# Patient Record
Sex: Female | Born: 1935
Health system: Southern US, Community
[De-identification: ages and names within clinical notes are randomized; demographics above are authoritative.]

## PROBLEM LIST (undated history)

## (undated) DIAGNOSIS — K6289 Other specified diseases of anus and rectum: Secondary | ICD-10-CM

## (undated) DIAGNOSIS — K5120 Ulcerative (chronic) proctitis without complications: Secondary | ICD-10-CM

## (undated) DIAGNOSIS — K449 Diaphragmatic hernia without obstruction or gangrene: Secondary | ICD-10-CM

## (undated) DIAGNOSIS — I10 Essential (primary) hypertension: Secondary | ICD-10-CM

## (undated) DIAGNOSIS — K219 Gastro-esophageal reflux disease without esophagitis: Secondary | ICD-10-CM

## (undated) HISTORY — DX: Gastro-esophageal reflux disease without esophagitis: K21.9

## (undated) HISTORY — DX: Ulcerative (chronic) proctitis without complications: K51.20

## (undated) HISTORY — DX: Diaphragmatic hernia without obstruction or gangrene: K44.9

## (undated) HISTORY — PX: COLONOSCOPY: SHX174

## (undated) HISTORY — PX: DILATION AND CURETTAGE OF UTERUS: SHX78

## (undated) HISTORY — DX: Other specified diseases of anus and rectum: K62.89

## (undated) HISTORY — PX: TONSILLECTOMY: SUR1361

---

## 2002-12-16 ENCOUNTER — Ambulatory Visit (HOSPITAL_COMMUNITY): Admission: RE | Admit: 2002-12-16 | Discharge: 2002-12-16 | Payer: Self-pay | Admitting: Internal Medicine

## 2004-12-09 ENCOUNTER — Ambulatory Visit: Payer: Self-pay | Admitting: Internal Medicine

## 2005-08-27 ENCOUNTER — Ambulatory Visit: Payer: Self-pay | Admitting: Internal Medicine

## 2006-01-20 ENCOUNTER — Ambulatory Visit: Payer: Self-pay | Admitting: Internal Medicine

## 2007-02-25 ENCOUNTER — Ambulatory Visit: Payer: Self-pay | Admitting: Internal Medicine

## 2011-03-17 ENCOUNTER — Ambulatory Visit (INDEPENDENT_AMBULATORY_CARE_PROVIDER_SITE_OTHER): Payer: Medicare Other | Admitting: Internal Medicine

## 2011-03-17 DIAGNOSIS — K219 Gastro-esophageal reflux disease without esophagitis: Secondary | ICD-10-CM

## 2011-03-17 DIAGNOSIS — K5190 Ulcerative colitis, unspecified, without complications: Secondary | ICD-10-CM

## 2011-06-23 ENCOUNTER — Other Ambulatory Visit (INDEPENDENT_AMBULATORY_CARE_PROVIDER_SITE_OTHER): Payer: Self-pay | Admitting: *Deleted

## 2011-06-23 DIAGNOSIS — K219 Gastro-esophageal reflux disease without esophagitis: Secondary | ICD-10-CM

## 2011-06-23 MED ORDER — ESOMEPRAZOLE MAGNESIUM 40 MG PO CPDR: 40.0000 mg | DELAYED_RELEASE_CAPSULE | Freq: Every day | ORAL | Status: DC

## 2011-06-23 NOTE — Telephone Encounter (Signed)
Refill request  for Nexium, last filled . Will need prior authorization.

## 2011-10-08 ENCOUNTER — Telehealth (INDEPENDENT_AMBULATORY_CARE_PROVIDER_SITE_OTHER): Payer: Self-pay | Admitting: *Deleted

## 2011-10-08 NOTE — Telephone Encounter (Signed)
Patient needs a new rx sent in for sulfasalazine. The dosage was changed to 500 mg tid and the pharmacy doesn't have that on file. The return phone number is [phone].

## 2011-10-10 NOTE — Telephone Encounter (Signed)
[facility] was called and she states that the RX is for 500 mg three times a day.

## 2011-11-20 ENCOUNTER — Other Ambulatory Visit (HOSPITAL_COMMUNITY): Payer: Self-pay | Admitting: Urology

## 2011-11-20 DIAGNOSIS — N390 Urinary tract infection, site not specified: Secondary | ICD-10-CM

## 2011-11-25 ENCOUNTER — Ambulatory Visit (HOSPITAL_COMMUNITY)
Admission: RE | Admit: 2011-11-25 | Discharge: 2011-11-25 | Disposition: A | Discharge status: Home / Self Care | Payer: Medicare Other | Source: Ambulatory Visit | Attending: Urology | Admitting: Urology

## 2011-11-25 DIAGNOSIS — R9389 Abnormal findings on diagnostic imaging of other specified body structures: Secondary | ICD-10-CM | POA: Insufficient documentation

## 2011-11-25 DIAGNOSIS — N390 Urinary tract infection, site not specified: Secondary | ICD-10-CM

## 2011-11-25 DIAGNOSIS — R319 Hematuria, unspecified: Secondary | ICD-10-CM | POA: Insufficient documentation

## 2011-11-25 MED ORDER — IOHEXOL 300 MG/ML  SOLN
125.0000 mL | Freq: Once | INTRAMUSCULAR | Status: AC | PRN
  Administered 2011-11-25: 125 mL via INTRAVENOUS

## 2011-12-04 ENCOUNTER — Encounter (INDEPENDENT_AMBULATORY_CARE_PROVIDER_SITE_OTHER): Payer: Self-pay | Admitting: *Deleted

## 2011-12-04 ENCOUNTER — Encounter (INDEPENDENT_AMBULATORY_CARE_PROVIDER_SITE_OTHER): Payer: Self-pay | Admitting: Internal Medicine

## 2011-12-04 ENCOUNTER — Other Ambulatory Visit (INDEPENDENT_AMBULATORY_CARE_PROVIDER_SITE_OTHER): Payer: Self-pay | Admitting: *Deleted

## 2011-12-04 ENCOUNTER — Ambulatory Visit (INDEPENDENT_AMBULATORY_CARE_PROVIDER_SITE_OTHER): Payer: Medicare Other | Admitting: Internal Medicine

## 2011-12-04 DIAGNOSIS — K219 Gastro-esophageal reflux disease without esophagitis: Secondary | ICD-10-CM

## 2011-12-04 DIAGNOSIS — I10 Essential (primary) hypertension: Secondary | ICD-10-CM

## 2011-12-04 DIAGNOSIS — R1013 Epigastric pain: Secondary | ICD-10-CM

## 2011-12-04 DIAGNOSIS — K5120 Ulcerative (chronic) proctitis without complications: Secondary | ICD-10-CM

## 2011-12-04 MED ORDER — SUCRALFATE 1 GM/10ML PO SUSP: 1.0000 g | Freq: Four times a day (QID) | ORAL | Status: DC

## 2011-12-04 NOTE — Patient Instructions (Addendum)
EGD. Carafate 1 gm qid.

## 2011-12-04 NOTE — Progress Notes (Signed)
Subjective:     Patient ID: Autumn Dean, female   DOB: [birth year], 78 y.o.   MRN: [id number]  HPI Autumn Dean is a 78 yr old female referred to our office by Dr. Woody Seller. She tells me that she saw Dr Michela Pitcher 2 weeks ago. She c/o burning in her left upper quadrant radiating into back and nausea. One week ago she had a CT which suggested pyelonephritis.   She has been on Septra recently and PEN VK.  She tells me she has the head of her bed elevated. She has tried Herbal hot tea and this has helped.  Appetite is good. No weight loss.  She tells me that she had tarry, black stools the first of January. Frequent acid reflux. She does have a hx of UC but is not having any problems.  BM x 2 a day which are normal. No recent melena. No bright red rectal bleeding.   No dysphagia.  EGD/Colonoscopy 12/21/2007: Erosive esophagitis and small sliding hiatal hernia. Colonoscopy revealed active UC in the rectum and distal sigmoid. She has a hx of C-difficile.   CT abdomen/pelvis with CM 11/20/2011 Hematuria IMPRESSION:  1. Findings suggest pyelonephritis along with areas of remote  scarring change. No renal abscess.  2. No renal, ureteral or bladder calculi.  Original Report Authenticated By: P. MARK GALLERANI  Review of Systems See hpi   Current Outpatient Prescriptions  Medication Sig Dispense Refill  . bisoprolol-hydrochlorothiazide (ZIAC) 5-6.25 MG per tablet Take 1 tablet by mouth daily.      . calcium acetate (PHOSLO) 667 MG capsule Take 667 mg by mouth 3 (three) times daily with meals.      . cyanocobalamin 500 MCG tablet Take 500 mcg by mouth daily.      Marland Kitchen esomeprazole (NEXIUM) 40 MG capsule Take 1 capsule (40 mg total) by mouth daily.  30 capsule  1  . folic acid (FOLVITE) 1 MG tablet Take 1 mg by mouth daily.      . psyllium (METAMUCIL) 58.6 % packet Take 1 packet by mouth daily.      . raloxifene (EVISTA) 60 MG tablet Take 60 mg by mouth daily.      Marland Kitchen sulfaSALAzine (AZULFIDINE) 500 MG EC tablet  Take 1,000 mg by mouth 2 (two) times daily.      . sucralfate (CARAFATE) 1 GM/10ML suspension Take 10 mLs (1 g total) by mouth 4 (four) times daily.  420 mL  1   Past Medical History  Diagnosis Date  . UC (ulcerative colitis confined to rectum)   . GERD (gastroesophageal reflux disease)   . Hiatal hernia    History reviewed. No pertinent past surgical history. History   Social History  . Marital Status: Single    Spouse Name: N/A    Number of Children: N/A  . Years of Education: N/A   Occupational History  . Not on file.   Social History Main Topics  . Smoking status: Never Smoker   . Smokeless tobacco: Not on file  . Alcohol Use: No  . Drug Use: No  . Sexually Active: Not on file   Other Topics Concern  . Not on file   Social History Narrative  . No narrative on file   Family Status  Relation Status Death Age  . Mother Deceased     natural causes  . Father Deceased     Lung cancer  . Brother Deceased     One deceased from lung cancer and one died  from colon cancer age40.  One in good health   Allergies  Allergen Reactions  . Codeine   . Demerol        Objective:   Physical Exam  Filed Vitals:   12/04/11 1105  Height: 5' 5"  (1.651 m)  Weight: 155 lb 8 oz (70.534 kg)    Alert and oriented. Skin warm and dry. Oral mucosa is moist.   . Sclera anicteric, conjunctivae is pink. Thyroid not enlarged. No cervical lymphadenopathy. Lungs clear. Heart regular rate and rhythm.  Abdomen is soft. Bowel sounds are positive. No hepatomegaly. No abdominal masses felt. No tenderness.  No edema to lower extremities. Patient is alert and oriented.     Assessment:    Melena, GERD, Left upper quadrant pain. PUD needs needs to be ruled out. UC which appears to be in remission.    Plan:    EGD. Carafate 1 gm qid.   Continue present medications.

## 2011-12-16 ENCOUNTER — Encounter (HOSPITAL_COMMUNITY): Payer: Self-pay | Admitting: Pharmacy Technician

## 2011-12-17 MED ORDER — SODIUM CHLORIDE 0.45 % IV SOLN
Freq: Once | INTRAVENOUS | Status: AC
  Administered 2011-12-18: 1000 mL via INTRAVENOUS

## 2011-12-18 ENCOUNTER — Ambulatory Visit (HOSPITAL_COMMUNITY)
Admission: RE | Admit: 2011-12-18 | Discharge: 2011-12-18 | Disposition: A | Discharge status: Home / Self Care | Payer: Medicare Other | Source: Ambulatory Visit | Attending: Internal Medicine | Admitting: Internal Medicine

## 2011-12-18 ENCOUNTER — Encounter (HOSPITAL_COMMUNITY)
Admission: RE | Disposition: A | Discharge status: Home / Self Care | Payer: Self-pay | Source: Ambulatory Visit | Attending: Internal Medicine

## 2011-12-18 ENCOUNTER — Encounter (HOSPITAL_COMMUNITY): Payer: Self-pay | Admitting: *Deleted

## 2011-12-18 DIAGNOSIS — R1012 Left upper quadrant pain: Secondary | ICD-10-CM | POA: Insufficient documentation

## 2011-12-18 DIAGNOSIS — K449 Diaphragmatic hernia without obstruction or gangrene: Secondary | ICD-10-CM | POA: Insufficient documentation

## 2011-12-18 DIAGNOSIS — D131 Benign neoplasm of stomach: Secondary | ICD-10-CM | POA: Insufficient documentation

## 2011-12-18 DIAGNOSIS — R1013 Epigastric pain: Secondary | ICD-10-CM

## 2011-12-18 DIAGNOSIS — K219 Gastro-esophageal reflux disease without esophagitis: Secondary | ICD-10-CM

## 2011-12-18 DIAGNOSIS — R1032 Left lower quadrant pain: Secondary | ICD-10-CM

## 2011-12-18 DIAGNOSIS — B3781 Candidal esophagitis: Secondary | ICD-10-CM

## 2011-12-18 HISTORY — PX: ESOPHAGOGASTRODUODENOSCOPY: SHX5428

## 2011-12-18 LAB — KOH PREP

## 2011-12-18 SURGERY — EGD (ESOPHAGOGASTRODUODENOSCOPY)
Anesthesia: Moderate Sedation

## 2011-12-18 MED ORDER — BUTAMBEN-TETRACAINE-BENZOCAINE 2-2-14 % EX AERO
INHALATION_SPRAY | CUTANEOUS | Status: DC | PRN
  Administered 2011-12-18: 1 via TOPICAL

## 2011-12-18 MED ORDER — FENTANYL CITRATE 0.05 MG/ML IJ SOLN
INTRAMUSCULAR | Status: DC | PRN
  Administered 2011-12-18: 25 ug via INTRAVENOUS

## 2011-12-18 MED ORDER — PANTOPRAZOLE SODIUM 40 MG PO TBEC
40.0000 mg | DELAYED_RELEASE_TABLET | Freq: Two times a day (BID) | ORAL | Status: DC
Start: 2011-12-18 — End: 2012-07-19

## 2011-12-18 MED ORDER — FENTANYL CITRATE 0.05 MG/ML IJ SOLN
INTRAMUSCULAR | Status: AC
  Filled 2011-12-18: qty 2

## 2011-12-18 MED ORDER — MIDAZOLAM HCL 5 MG/5ML IJ SOLN
INTRAMUSCULAR | Status: AC
  Filled 2011-12-18: qty 5

## 2011-12-18 MED ORDER — NYSTATIN 100000 UNIT/ML MT SUSP: 500000.0000 [IU] | Freq: Four times a day (QID) | OROMUCOSAL | Status: AC

## 2011-12-18 MED ORDER — MIDAZOLAM HCL 5 MG/5ML IJ SOLN
INTRAMUSCULAR | Status: DC | PRN
  Administered 2011-12-18 (×2): 2 mg via INTRAVENOUS

## 2011-12-18 NOTE — Discharge Instructions (Signed)
Resume usual diet and medications except discontinue Nexium. Start pantoprazole 40 mg by mouth 30 minutes before breakfast and evening meal. Mycostatin suspension 500,000 units swish and swallow 4 times a day for 10 days. Notified if you have another episode of melena or tarry stool  Endoscopy Care After Please read the instructions outlined below and refer to this sheet in the next few weeks. These discharge instructions provide you with general information on caring for yourself after you leave the hospital. Your doctor may also give you specific instructions. While your treatment has been planned according to the most current medical practices available, unavoidable complications occasionally occur. If you have any problems or questions after discharge, please call your doctor. HOME CARE INSTRUCTIONS Activity  You may resume your regular activity but move at a slower pace for the next 24 hours.   Take frequent rest periods for the next 24 hours.   Walking will help expel (get rid of) the air and reduce the bloated feeling in your abdomen.   No driving for 24 hours (because of the anesthesia (medicine) used during the test).   You may shower.   Do not sign any important legal documents or operate any machinery for 24 hours (because of the anesthesia used during the test).  Nutrition  Drink plenty of fluids.   You may resume your normal diet.   Begin with a light meal and progress to your normal diet.   Avoid alcoholic beverages for 24 hours or as instructed by your caregiver.  Medications You may resume your normal medications unless your caregiver tells you otherwise. What you can expect today  You may experience abdominal discomfort such as a feeling of fullness or "gas" pains.   You may experience a sore throat for 2 to 3 days. This is normal. Gargling with salt water may help this.  Follow-up Your doctor will discuss the results of your test with you. SEEK IMMEDIATE  MEDICAL CARE IF:  You have excessive nausea (feeling sick to your stomach) and/or vomiting.   You have severe abdominal pain and distention (swelling).   You have trouble swallowing.   You have a temperature over 100 F (37.8 C).   You have rectal bleeding or vomiting of blood.  Document Released: 06/03/2004 Document Revised: 07/02/2011 Document Reviewed: 12/15/2007 [facility] Patient Information 2012 [place].

## 2011-12-18 NOTE — H&P (Signed)
This is an update to history and physical from 12/04/2011. Patient has not experience any more episodes of melena. She continues to complain of intermittent pain in left upper quadrant Carafate seemed to help. She believes Nexium is not working anymore and would like to try another PPI. Patient is undergoing diagnostic EGD 30

## 2011-12-18 NOTE — Op Note (Signed)
EGD PROCEDURE REPORT  PATIENT:  Autumn Dean  MR#:  [id number] Birthdate:  26-Dec-1932, 78 y.o., female Endoscopist:  Dr. Rogene Houston, MD Referred By:  Dr. Glenda Chroman, MD Procedure Date: 12/18/2011  Procedure:   EGD  Indications:  Patient is 13 old Caucasian female with history of melena which cleared spontaneously. She complains of intermittent left upper quadrant pain relieved with Carafate. She is undergoing diagnostic EGD. She is on Nexium for GERD and she said is not working. She has chronic UC and has been in remission for the past few years. She is undergoing diagnostic EGD.      Informed Consent:  The risks, benefits, alternatives & imponderables which include, but are not limited to, bleeding, infection, perforation, drug reaction and potential missed lesion have been reviewed.  The potential for biopsy, lesion removal, esophageal dilation, etc. have also been discussed.  Questions have been answered.  All parties agreeable.  Please see history & physical in medical record for more information.  Medications:  Fentanyl 25  mcg IV Versed 4 mg IV Cetacaine spray topically for oropharyngeal anesthesia  Description of procedure:  The endoscope was introduced through the mouth and advanced to the second portion of the duodenum without difficulty or limitations. The mucosal surfaces were surveyed very carefully during advancement of the scope and upon withdrawal.  Findings:  Esophagus:  There was patchy cheesy exudate at esophageal  body suspicious for Candida esophagitis. Brushing taken for KOH prep. No erosions or ulcers were identified. GEJ:  34 cm Hiatus:  36 cm Stomach:  Stomach was empty and distended very well with insufflation. Fold in the proximal stomach were normal. Examination mucosa revealed a few 3-4 mm hyperplastic-appearing polyps in gastric body which were left alone. No erosions or ulcers identified. Pyloric channel was patent. Angularis fundus and cardia  examined by retroflexion of scope were normal. Duodenum:  Normal bulbar and post bulbar mucosa.  Therapeutic/Diagnostic Maneuvers Performed:  Brushing obtained from the esophagus for KOH prep.  Complications:  None.  Impression: Mild changes of candida esophagitis. Brushing obtained for KOH prep. Small sliding hiatal hernia. Few small hyperplastic polyps in gastric body. These were left alone. No evidence of peptic ulcer disease or other gastric lesions.  Recommendations:  Mycostatin suspension 500,000 units swish and swallow 4 times a day for 10 days. Discontinue Nexium. Start pantoprazole 40 mg by mouth twice a day. Patient will call if melena recurs.  Damyan Corne U  12/18/2011  2:44 PM  CC: Dr. Glenda Chroman., MD, MD & Dr. Rayne Du ref. provider found

## 2011-12-25 ENCOUNTER — Encounter (HOSPITAL_COMMUNITY): Payer: Self-pay | Admitting: Internal Medicine

## 2012-01-28 ENCOUNTER — Encounter (INDEPENDENT_AMBULATORY_CARE_PROVIDER_SITE_OTHER): Payer: Self-pay | Admitting: Internal Medicine

## 2012-01-28 ENCOUNTER — Ambulatory Visit (INDEPENDENT_AMBULATORY_CARE_PROVIDER_SITE_OTHER): Payer: Medicare Other | Admitting: Internal Medicine

## 2012-01-28 VITALS — BP 130/62 | HR 66 | Temp 97.9°F | Ht 65.0 in | Wt 156.4 lb

## 2012-01-28 DIAGNOSIS — K5120 Ulcerative (chronic) proctitis without complications: Secondary | ICD-10-CM

## 2012-01-28 MED ORDER — HYDROCORTISONE 100 MG/60ML RE ENEM: 100.0000 mg | ENEMA | Freq: Every day | RECTAL | Status: DC

## 2012-01-28 NOTE — Patient Instructions (Addendum)
Sed rate and Hydrocortisone enemas called in.  OV in 2 months.

## 2012-01-28 NOTE — Progress Notes (Signed)
Subjective:     Patient ID: Autumn Dean, female   DOB: 08-15-1933, 78 y.o.   MRN: [id number]  HPI Autumn Dean is a 78 yr old female here today with c/o that she has been having a lot of gas.She says the Protonix is causing a lot of gas.  She tells me she is passing mucous and has been a little bit constipated.  She is having a BM x 2 today and are formed. Sometimes she will have 3 stools. She does not have urgency.  She was diagnosed with UC over 30 yrs ago.  In 2009 she underwent a colonoscopy which revealed non-specific colitis.  Review of Systems see hpi Current Outpatient Prescriptions  Medication Sig Dispense Refill  . bisoprolol-hydrochlorothiazide (ZIAC) 5-6.25 MG per tablet Take 1 tablet by mouth daily.      . calcium acetate (PHOSLO) 667 MG capsule Take 667 mg by mouth 3 (three) times daily with meals.      . Calcium Carbonate-Vit D-Min (CALCIUM 600+D PLUS MINERALS) 600-400 MG-UNIT TABS Take by mouth.      . cyanocobalamin 500 MCG tablet Take 500 mcg by mouth daily.      . folic acid (FOLVITE) 1 MG tablet Take 1 mg by mouth daily.      . pantoprazole (PROTONIX) 40 MG tablet Take 1 tablet (40 mg total) by mouth 2 (two) times daily.  60 tablet  11  . psyllium (METAMUCIL) 58.6 % packet Take 1 packet by mouth daily.      . raloxifene (EVISTA) 60 MG tablet Take 60 mg by mouth daily.      Marland Kitchen sulfaSALAzine (AZULFIDINE) 500 MG EC tablet Take 500 mg by mouth 3 (three) times daily.        Current Outpatient Prescriptions on File Prior to Visit  Medication Sig Dispense Refill  . bisoprolol-hydrochlorothiazide (ZIAC) 5-6.25 MG per tablet Take 1 tablet by mouth daily.      . calcium acetate (PHOSLO) 667 MG capsule Take 667 mg by mouth 3 (three) times daily with meals.      . Calcium Carbonate-Vit D-Min (CALCIUM 600+D PLUS MINERALS) 600-400 MG-UNIT TABS Take by mouth.      . cyanocobalamin 500 MCG tablet Take 500 mcg by mouth daily.      . folic acid (FOLVITE) 1 MG tablet Take 1 mg by mouth  daily.      . pantoprazole (PROTONIX) 40 MG tablet Take 1 tablet (40 mg total) by mouth 2 (two) times daily.  60 tablet  11  . psyllium (METAMUCIL) 58.6 % packet Take 1 packet by mouth daily.      . raloxifene (EVISTA) 60 MG tablet Take 60 mg by mouth daily.      Marland Kitchen sulfaSALAzine (AZULFIDINE) 500 MG EC tablet Take 500 mg by mouth 3 (three) times daily.        Past Surgical History  Procedure Date  . Esophagogastroduodenoscopy 12/18/2011    Procedure: ESOPHAGOGASTRODUODENOSCOPY (EGD);  Surgeon: Rogene Houston, MD;  Location: [facility];  Service: Endoscopy;  Laterality: N/A;  200   Family Status  Relation Status Death Age  . Mother Deceased     natural causes  . Father Deceased     Lung cancer  . Brother Deceased     One deceased from lung cancer and one died from colon cancer age40.  One in good health   History   Social History  . Marital Status: Single    Spouse Name: N/A  Number of Children: N/A  . Years of Education: N/A   Occupational History  . Not on file.   Social History Main Topics  . Smoking status: Never Smoker   . Smokeless tobacco: Not on file  . Alcohol Use: No  . Drug Use: No  . Sexually Active: Not on file   Other Topics Concern  . Not on file   Social History Narrative  . No narrative on file   Allergies  Allergen Reactions  . Codeine   . Demerol        Objective:   Physical Exam Filed Vitals:   01/28/12 1556  Height: 5' 5"  (1.651 m)  Weight: 156 lb 6.4 oz (70.943 kg)   Alert and oriented. Skin warm and dry. Oral mucosa is moist.   . Sclera anicteric, conjunctivae is pink. Thyroid not enlarged. No cervical lymphadenopathy. Lungs clear. Heart regular rate and rhythm.  Abdomen is soft. Bowel sounds are positive. No hepatomegaly. No abdominal masses felt. No tenderness.  No edema to lower extremities. Patient is alert and oriented.      Assessment:   UC  Flare possible.    Plan:   Hydrocortisone enema 1 PR every HS x 2 weeks. OV in 2  months.

## 2012-01-29 LAB — SEDIMENTATION RATE: Sed Rate: 6 mm/hr (ref 0–22)

## 2012-03-30 ENCOUNTER — Ambulatory Visit (INDEPENDENT_AMBULATORY_CARE_PROVIDER_SITE_OTHER): Payer: Medicare Other | Admitting: Internal Medicine

## 2012-04-06 ENCOUNTER — Ambulatory Visit (INDEPENDENT_AMBULATORY_CARE_PROVIDER_SITE_OTHER): Payer: Medicare Other | Admitting: Internal Medicine

## 2012-06-10 ENCOUNTER — Encounter (INDEPENDENT_AMBULATORY_CARE_PROVIDER_SITE_OTHER): Payer: Self-pay | Admitting: *Deleted

## 2012-07-19 ENCOUNTER — Telehealth (INDEPENDENT_AMBULATORY_CARE_PROVIDER_SITE_OTHER): Payer: Self-pay | Admitting: *Deleted

## 2012-07-19 ENCOUNTER — Ambulatory Visit (INDEPENDENT_AMBULATORY_CARE_PROVIDER_SITE_OTHER): Payer: Medicare Other | Admitting: Internal Medicine

## 2012-07-19 ENCOUNTER — Encounter (INDEPENDENT_AMBULATORY_CARE_PROVIDER_SITE_OTHER): Payer: Self-pay | Admitting: Internal Medicine

## 2012-07-19 ENCOUNTER — Other Ambulatory Visit (INDEPENDENT_AMBULATORY_CARE_PROVIDER_SITE_OTHER): Payer: Self-pay | Admitting: *Deleted

## 2012-07-19 VITALS — BP 120/70 | HR 74 | Temp 97.1°F | Resp 20 | Ht 65.0 in | Wt 150.0 lb

## 2012-07-19 DIAGNOSIS — K219 Gastro-esophageal reflux disease without esophagitis: Secondary | ICD-10-CM

## 2012-07-19 DIAGNOSIS — K5190 Ulcerative colitis, unspecified, without complications: Secondary | ICD-10-CM

## 2012-07-19 DIAGNOSIS — Z1211 Encounter for screening for malignant neoplasm of colon: Secondary | ICD-10-CM

## 2012-07-19 DIAGNOSIS — R195 Other fecal abnormalities: Secondary | ICD-10-CM

## 2012-07-19 MED ORDER — OMEPRAZOLE MAGNESIUM 20 MG PO TBEC: 20.0000 mg | DELAYED_RELEASE_TABLET | Freq: Every day | ORAL | Status: DC

## 2012-07-19 NOTE — Progress Notes (Signed)
Presenting complaint;  Heme positive stool. History of ulcerative colitis.  Subjective:  Autumn Dean is 78 year old Caucasian female who has history of distal ulcerative colitis and was last seen 6 months ago. Since last colonoscopy was in February 2009 and she was noted have active disease. However lately she is having 1-2 formed stools daily. She denies melena or rectal bleeding. She has good appetite. She has occasional right-sided pain which she describes as a gas pain. She has been having intermittent heartburn lately.she tried Prilosec and feels it works better than Nexium. She would therefore like to be switched to Prilosec.she denies dysphagia. Patient has history of erosive esophagitis at the time of last EGD of February 2009.  Current Medications: Current Outpatient Prescriptions  Medication Sig Dispense Refill  . bisoprolol-hydrochlorothiazide (ZIAC) 5-6.25 MG per tablet Take 1 tablet by mouth daily.      . Calcium Carbonate-Vit D-Min (CALCIUM 600+D PLUS MINERALS) 600-400 MG-UNIT TABS Take by mouth 2 (two) times daily.       Autumn Dean Kitchen Autumn Dean (NEXIUM) 40 MG capsule Take 40 mg by mouth daily before breakfast.      . folic acid (FOLVITE) 1 MG tablet Take 1 mg by mouth daily.      . psyllium (METAMUCIL) 58.6 % packet Take 1 packet by mouth daily.      Autumn Dean Kitchen sulfaSALAzine (AZULFIDINE) 500 MG EC tablet Take 500 mg by mouth 3 (three) times daily.       . cyanocobalamin 500 MCG tablet Take 500 mcg by mouth daily.      . pantoprazole (PROTONIX) 40 MG tablet Take 1 tablet (40 mg total) by mouth 2 (two) times daily.  60 tablet  11  . raloxifene (EVISTA) 60 MG tablet Take 60 mg by mouth daily.      Autumn Dean Kitchen DISCONTD: fluticasone (FLONASE) 50 MCG/ACT nasal spray Place 2 sprays into the nose daily.      Autumn Dean Kitchen DISCONTD: sucralfate (CARAFATE) 1 GM/10ML suspension Take 10 mLs (1 g total) by mouth 4 (four) times daily.  420 mL  1     Objective: Blood pressure 120/70, pulse 74, temperature 97.1 F (36.2 C),  temperature source Oral, resp. rate 20, height 5' 5"  (1.651 m), weight 150 lb (68.04 kg). Patient is alert and in no acute distress. Conjunctiva is pink. Sclera is nonicteric Oropharyngeal mucosa is normal. No neck masses or thyromegaly noted. Cardiac exam with regular rhythm normal S1 and S2. No murmur or gallop noted. Lungs are clear to auscultation. Abdomen is soft and nontender without organomegaly or masses.  No LE edema or clubbing noted.  Labs/studies Results:  from 05/28/2012. WBC 3.6, H&H 12.3 and 37.9, platelet count 242K. Total bilirubin 0.4, AP 76, AST 18, ALT 13, albumin 4.1, calcium 9.0. Creatinine 0.93 and BUN 17 glucose 82 electrolytes are normal.  Assessment:   #1.Patient has history of distal ulcerative colitis and appears to be in remission. Her last colonoscopy was in February 2009. Given that she has heme positive stools we need to proceed with colonoscopy now rather than next year. Her H&H is normal which is reassuring. #2. GERD. She is having after symptom controlled with Prilosec or omeprazole rather than Nexium. Therefore will switch her to omeprazole. #3. Mild leukopenia with a normal differential. She will need repeat CBC within the next few weeks to make sure neutropenia not progressive.   Plan:  Discontinue Nexium. Start Prilosec OTC 20 mg by mouth every morning. Colonoscopy to be scheduled in near future.

## 2012-07-19 NOTE — Telephone Encounter (Signed)
Will u put medication back in.

## 2012-07-19 NOTE — Patient Instructions (Addendum)
Colonoscopy to be scheduled

## 2012-07-19 NOTE — Telephone Encounter (Signed)
Patient needs trilyte 

## 2012-07-20 ENCOUNTER — Ambulatory Visit (INDEPENDENT_AMBULATORY_CARE_PROVIDER_SITE_OTHER): Payer: Medicare Other | Admitting: Internal Medicine

## 2012-07-20 MED ORDER — PEG 3350-KCL-NA BICARB-NACL 420 G PO SOLR: 4000.0000 mL | Freq: Once | ORAL | Status: DC

## 2012-08-13 ENCOUNTER — Encounter (HOSPITAL_COMMUNITY): Payer: Self-pay | Admitting: Pharmacy Technician

## 2012-08-17 MED ORDER — SODIUM CHLORIDE 0.45 % IV SOLN
INTRAVENOUS | Status: DC
  Administered 2012-08-18: 11:00:00 via INTRAVENOUS

## 2012-08-18 ENCOUNTER — Ambulatory Visit (HOSPITAL_COMMUNITY)
Admission: RE | Admit: 2012-08-18 | Discharge: 2012-08-18 | Disposition: A | Discharge status: Home / Self Care | Payer: Medicare Other | Source: Ambulatory Visit | Attending: Internal Medicine | Admitting: Internal Medicine

## 2012-08-18 ENCOUNTER — Encounter (HOSPITAL_COMMUNITY): Payer: Self-pay | Admitting: *Deleted

## 2012-08-18 ENCOUNTER — Encounter (HOSPITAL_COMMUNITY)
Admission: RE | Disposition: A | Discharge status: Home / Self Care | Payer: Self-pay | Source: Ambulatory Visit | Attending: Internal Medicine

## 2012-08-18 DIAGNOSIS — K5289 Other specified noninfective gastroenteritis and colitis: Secondary | ICD-10-CM | POA: Insufficient documentation

## 2012-08-18 DIAGNOSIS — K5190 Ulcerative colitis, unspecified, without complications: Secondary | ICD-10-CM

## 2012-08-18 DIAGNOSIS — R195 Other fecal abnormalities: Secondary | ICD-10-CM

## 2012-08-18 DIAGNOSIS — I10 Essential (primary) hypertension: Secondary | ICD-10-CM | POA: Insufficient documentation

## 2012-08-18 DIAGNOSIS — K921 Melena: Secondary | ICD-10-CM | POA: Insufficient documentation

## 2012-08-18 HISTORY — PX: COLONOSCOPY: SHX5424

## 2012-08-18 HISTORY — DX: Essential (primary) hypertension: I10

## 2012-08-18 SURGERY — COLONOSCOPY
Anesthesia: Moderate Sedation

## 2012-08-18 MED ORDER — FENTANYL CITRATE 0.05 MG/ML IJ SOLN
INTRAMUSCULAR | Status: DC | PRN
  Administered 2012-08-18 (×2): 25 ug via INTRAVENOUS

## 2012-08-18 MED ORDER — MIDAZOLAM HCL 5 MG/5ML IJ SOLN
INTRAMUSCULAR | Status: AC
  Filled 2012-08-18: qty 10

## 2012-08-18 MED ORDER — FENTANYL CITRATE 0.05 MG/ML IJ SOLN
INTRAMUSCULAR | Status: AC
  Filled 2012-08-18: qty 2

## 2012-08-18 MED ORDER — MIDAZOLAM HCL 5 MG/5ML IJ SOLN
INTRAMUSCULAR | Status: DC | PRN
  Administered 2012-08-18: 2 mg via INTRAVENOUS

## 2012-08-18 MED ORDER — STERILE WATER FOR IRRIGATION IR SOLN
Status: DC | PRN
  Administered 2012-08-18: 12:00:00

## 2012-08-18 NOTE — H&P (Signed)
Autumn Dean is an 78 y.o. female.   Chief Complaint: Patient is here for colonoscopy. 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 Her last colonoscopy was 4 years ago. She is undergoing diagnostic/surveillance colonoscopy. She has good appetite and her weight is stable. Since she has been taking probiotic her stools have been firm to hard.  Past Medical History  Diagnosis Date  . UC (ulcerative colitis confined to rectum)   . GERD (gastroesophageal reflux disease)   . Hiatal hernia   . Proctitis   . Hypertension     Past Surgical History  Procedure Date  . Esophagogastroduodenoscopy 12/18/2011    Procedure: ESOPHAGOGASTRODUODENOSCOPY (EGD);  Surgeon: Rogene Houston, MD;  Location: [facility];  Service: Endoscopy;  Laterality: N/A;  200  . Tonsillectomy   . Colonoscopy   . Dilation and curettage of uterus     Family History  Problem Relation Age of Onset  . Colon cancer Neg Hx    Social History:  reports that she has never smoked. She has never used smokeless tobacco. She reports that she does not drink alcohol or use illicit drugs.  Allergies:  Allergies  Allergen Reactions  . Codeine Rash  . Demerol Rash    Medications Prior to Admission  Medication Sig Dispense Refill  . bisoprolol-hydrochlorothiazide (ZIAC) 5-6.25 MG per tablet Take 1 tablet by mouth daily.      . Calcium Carbonate-Vit D-Min (CALCIUM 600+D PLUS MINERALS) 600-400 MG-UNIT TABS Take by mouth 2 (two) times daily.       Marland Kitchen omeprazole (PRILOSEC OTC) 20 MG tablet Take 1 tablet (20 mg total) by mouth daily.  30 tablet  11  . polyethylene glycol-electrolytes (NULYTELY/GOLYTELY) 420 G solution Take 4,000 mLs by mouth once.  4000 mL  0  . cyanocobalamin 500 MCG tablet Take 500 mcg by mouth daily.      . folic acid (FOLVITE) 1 MG tablet Take 1 mg by mouth daily.      . psyllium (METAMUCIL) 58.6 % packet Take 1  packet by mouth daily.      Marland Kitchen sulfaSALAzine (AZULFIDINE) 500 MG EC tablet Take 500 mg by mouth 3 (three) times daily.         No results found for this or any previous visit (from the past 48 hour(s)). No results found.  ROS  Blood pressure 161/82, pulse 74, temperature 97.9 F (36.6 C), temperature source Oral, resp. rate 19, height 5' 5"  (1.651 m), weight 150 lb (68.04 kg), SpO2 100.00%. Physical Exam  Constitutional: She appears well-developed and well-nourished.  HENT:  Mouth/Throat: Oropharynx is clear and moist.  Eyes: Conjunctivae normal are normal. No scleral icterus.  Neck: No thyromegaly present.  Cardiovascular: Normal rate, regular rhythm and normal heart sounds.   No murmur heard. Respiratory: Effort normal and breath sounds normal.  GI: Soft. She exhibits no distension and no mass. There is no tenderness.  Musculoskeletal: She exhibits no edema.  Lymphadenopathy:    She has no cervical adenopathy.  Neurological: She is alert.  Skin: Skin is warm.     Assessment/Plan Chronic ulcerative colitis. Heme positive stools. Diagnostic/surveillance colonoscopy.  REHMAN,NAJEEB U 08/18/2012, 11:49 AM

## 2012-08-18 NOTE — Op Note (Signed)
COLONOSCOPY PROCEDURE REPORT  PATIENT:  Autumn Dean  MR#:  [id number] Birthdate:  30-Mar-1933, 78 y.o., female Endoscopist:  Dr. Rogene Houston, MD Referred By:  Dr. Glenda Chroman, MD Procedure Date: 08/18/2012  Procedure:   Colonoscopy  Indications:  Patient is 78 year old Caucasian female with history of chronic ulcerative colitis whose last exam was over 4 years ago was noted to have heme positive stools. She is having normal bowel movements. She is undergoing diagnostic/surveillance colonoscopy.  Informed Consent:  The procedure and risks were reviewed with the patient and informed consent was obtained.  Medications:  Fentanyl 50 mcg IV Versed 2 mg IV  Description of procedure:  After a digital rectal exam was performed, that colonoscope was advanced from the anus through the rectum and colon to the area of the cecum, ileocecal valve and appendiceal orifice. The cecum was deeply intubated. These structures were well-seen and photographed for the record. From the level of the cecum and ileocecal valve, the scope was slowly and cautiously withdrawn. The mucosal surfaces were carefully surveyed utilizing scope tip to flexion to facilitate fold flattening as needed. The scope was pulled down into the rectum where a thorough exam including retroflexion was performed.  Findings:   Prep excellent. Multiple punctate erosions noted involving mucosa of distal 10 cm of sigmoid colon and rectum. Multiple biopsies taken from rectosigmoid area and submitted together. Rest of the mucosa was normal. Thickened anoderm. Therapeutic/Diagnostic Maneuvers Performed:  See above  Complications:  None  Cecal Withdrawal Time:  15 minutes  Impression:  Examination performed to cecum. Active colitis involving distal sigmoid colon and rectum (multiple erosions and friable mucosa). Biopsies taken. No evidence of polyps or mass.   Recommendations:  Standard instructions given. I will contact patient  with biopsy results and further recommendations.  REHMAN,NAJEEB U  08/18/2012 12:24 PM  CC: Dr. Glenda Chroman., MD & Dr. Rayne Du ref. provider found

## 2012-08-20 ENCOUNTER — Encounter (HOSPITAL_COMMUNITY): Payer: Self-pay | Admitting: Internal Medicine

## 2012-08-23 ENCOUNTER — Encounter (INDEPENDENT_AMBULATORY_CARE_PROVIDER_SITE_OTHER): Payer: Self-pay

## 2012-08-23 ENCOUNTER — Other Ambulatory Visit (INDEPENDENT_AMBULATORY_CARE_PROVIDER_SITE_OTHER): Payer: Self-pay | Admitting: Internal Medicine

## 2012-08-23 DIAGNOSIS — K5120 Ulcerative (chronic) proctitis without complications: Secondary | ICD-10-CM

## 2012-08-23 MED ORDER — MESALAMINE 1000 MG RE SUPP: 1000.0000 mg | Freq: Every day | RECTAL | Status: DC

## 2012-08-24 ENCOUNTER — Encounter (INDEPENDENT_AMBULATORY_CARE_PROVIDER_SITE_OTHER): Payer: Self-pay | Admitting: *Deleted

## 2012-08-25 ENCOUNTER — Telehealth (INDEPENDENT_AMBULATORY_CARE_PROVIDER_SITE_OTHER): Payer: Self-pay | Admitting: *Deleted

## 2012-08-25 NOTE — Telephone Encounter (Signed)
Patient states that Dr.Rehman called and left her an message with her results. She ask 2 questions  1. How long will she need to use the Canasa Suppositories?  2. When will she know that it has cleared up since she has had no symptoms/signs? She has appointment for 10-23-12.

## 2012-08-26 NOTE — Telephone Encounter (Signed)
She'll use it for 8 weeks. Will do Hemoccult at the time of office visit to make sure weight is negative. Please call patient

## 2012-08-26 NOTE — Telephone Encounter (Signed)
Patient was called and a message was left on her voicemail at her home.

## 2012-11-15 ENCOUNTER — Encounter (INDEPENDENT_AMBULATORY_CARE_PROVIDER_SITE_OTHER): Payer: Self-pay | Admitting: Internal Medicine

## 2012-11-15 ENCOUNTER — Ambulatory Visit (INDEPENDENT_AMBULATORY_CARE_PROVIDER_SITE_OTHER): Payer: Medicare Other | Admitting: Internal Medicine

## 2012-11-15 VITALS — BP 112/76 | HR 72 | Temp 97.4°F | Resp 18 | Ht 65.0 in | Wt 154.9 lb

## 2012-11-15 DIAGNOSIS — K5190 Ulcerative colitis, unspecified, without complications: Secondary | ICD-10-CM

## 2012-11-15 DIAGNOSIS — K219 Gastro-esophageal reflux disease without esophagitis: Secondary | ICD-10-CM

## 2012-11-15 MED ORDER — OMEPRAZOLE MAGNESIUM 20 MG PO TBEC: 20.0000 mg | DELAYED_RELEASE_TABLET | Freq: Every day | ORAL | Status: DC

## 2012-11-15 NOTE — Patient Instructions (Signed)
Hemoccult x1. Take omeprazole 20 mg by mouth 30 minutes before breakfast daily

## 2012-11-15 NOTE — Progress Notes (Signed)
Presenting complaint;  Followup for ulcerative colitis.  Subjective:  Patient is 79 year old Caucasian female with chronic ulcerative colitis and underwent diagnostic/surveillance colonoscopy in October 2013. At that time her stool was guaiac-positive. Colonoscopy revealed active disease in rectum and distal sigmoid colon. She has been using mesalamine suppositories. She has no GI complaints. She has 3 formed stools daily. She denies rectal discharge or bleeding. She's had recurrent bladder infections. She stop using depends since she is not having rectal discharge anymore. Her appetite is good. Her heartburn is well controlled with PPI and she would like to get refill.  Current Medications: Current Outpatient Prescriptions  Medication Sig Dispense Refill  . bisoprolol-hydrochlorothiazide (ZIAC) 5-6.25 MG per tablet Take 1 tablet by mouth daily.      . Calcium Carbonate-Vit D-Min (CALCIUM 600+D PLUS MINERALS) 600-400 MG-UNIT TABS Take by mouth 2 (two) times daily.       . cyanocobalamin 500 MCG tablet Take 1,000 mcg by mouth daily.       . folic acid (FOLVITE) 1 MG tablet Take 1 mg by mouth daily.      . mesalamine (CANASA) 1000 MG suppository Place 1 suppository (1,000 mg total) rectally at bedtime.  30 suppository  1  . omeprazole (PRILOSEC OTC) 20 MG tablet Take 1 tablet (20 mg total) by mouth daily.  30 tablet  11  . Probiotic Product (PROBIOTIC DAILY PO) Take by mouth.      . psyllium (METAMUCIL) 58.6 % packet Take 1 packet by mouth daily.      Marland Kitchen sulfaSALAzine (AZULFIDINE) 500 MG EC tablet Take 500 mg by mouth 3 (three) times daily.       . [DISCONTINUED] fluticasone (FLONASE) 50 MCG/ACT nasal spray Place 2 sprays into the nose daily.      . [DISCONTINUED] sucralfate (CARAFATE) 1 GM/10ML suspension Take 10 mLs (1 g total) by mouth 4 (four) times daily.  420 mL  1     Objective: Blood pressure 112/76, pulse 72, temperature 97.4 F (36.3 C), temperature source Oral, resp. rate 18,  height 5' 5"  (1.651 m), weight 154 lb 14.4 oz (70.262 kg). Conjunctiva is pink. Sclera is nonicteric Oropharyngeal mucosa is normal. No neck masses or thyromegaly noted. Abdomen is soft and nontender without organomegaly or masses. No LE edema or clubbing noted.   Assessment:  #1. Ulcerative colitis. Last colonoscopy was 3 months ago revealing active disease in rectum and distal sigmoid colon and she's been maintained on mesalamine suppositories. She appears to be back in remission but will make sure that her stool is guaiac negative before therapy discontinued. #2. GERD. Symptoms well controlled with PPI.   Plan:  Hemoccult x1. If Hemoccult is negative will stop mesalamine suppositories when she runs out of prescription otherwise we'll continue at Monday Wednesday and Friday schedule for another 3 months. New prescription for omeprazole 20 mg daily 90 day supply with 3 refills issued. Office visit in one year.

## 2012-12-07 ENCOUNTER — Telehealth (INDEPENDENT_AMBULATORY_CARE_PROVIDER_SITE_OTHER): Payer: Self-pay | Admitting: *Deleted

## 2012-12-07 ENCOUNTER — Encounter (INDEPENDENT_AMBULATORY_CARE_PROVIDER_SITE_OTHER): Payer: Self-pay

## 2012-12-07 NOTE — Telephone Encounter (Signed)
Per Dr.Rehman is it okay to switch to Omeprazole 20 mg Take 1 by mouth daily #30 11 refills. [facility]

## 2013-01-03 ENCOUNTER — Other Ambulatory Visit (INDEPENDENT_AMBULATORY_CARE_PROVIDER_SITE_OTHER): Payer: Self-pay | Admitting: Internal Medicine

## 2013-01-08 ENCOUNTER — Other Ambulatory Visit (INDEPENDENT_AMBULATORY_CARE_PROVIDER_SITE_OTHER): Payer: Self-pay | Admitting: Internal Medicine

## 2013-06-02 ENCOUNTER — Telehealth (INDEPENDENT_AMBULATORY_CARE_PROVIDER_SITE_OTHER): Payer: Self-pay | Admitting: *Deleted

## 2013-06-02 NOTE — Telephone Encounter (Signed)
11:33 am Patient had called in and left a voice mail that she thinks she needs an appt.    Returned her 1:56 pm  Patient states she did have her PE from Dr. Woody Seller this week and they gave her a hemoccult card.  She feels like she may have some mild tenderness in her rectum.  She will be taking this back today to them or tomorrow.  She usually has diarrhea but she is now have constipation.  She will get Dr. Woody Seller to advise [place] or she will call back if she had a positive stool and get scheduled.  She decided not to make an appt. At this time with [place].  Told her we would be here if she needed [place].  Call ended

## 2013-07-06 ENCOUNTER — Encounter: Payer: Self-pay | Admitting: Cardiology

## 2013-07-08 ENCOUNTER — Ambulatory Visit (INDEPENDENT_AMBULATORY_CARE_PROVIDER_SITE_OTHER): Payer: Medicare Other | Admitting: Cardiovascular Disease

## 2013-07-08 ENCOUNTER — Encounter: Payer: Self-pay | Admitting: *Deleted

## 2013-07-08 ENCOUNTER — Encounter: Payer: Self-pay | Admitting: Cardiovascular Disease

## 2013-07-08 VITALS — BP 149/83 | HR 79 | Ht 65.0 in | Wt 148.8 lb

## 2013-07-08 DIAGNOSIS — R5381 Other malaise: Secondary | ICD-10-CM

## 2013-07-08 DIAGNOSIS — R0609 Other forms of dyspnea: Secondary | ICD-10-CM

## 2013-07-08 DIAGNOSIS — R5383 Other fatigue: Secondary | ICD-10-CM

## 2013-07-08 DIAGNOSIS — I2789 Other specified pulmonary heart diseases: Secondary | ICD-10-CM

## 2013-07-08 DIAGNOSIS — I059 Rheumatic mitral valve disease, unspecified: Secondary | ICD-10-CM

## 2013-07-08 DIAGNOSIS — I272 Pulmonary hypertension, unspecified: Secondary | ICD-10-CM

## 2013-07-08 DIAGNOSIS — I10 Essential (primary) hypertension: Secondary | ICD-10-CM

## 2013-07-08 DIAGNOSIS — I340 Nonrheumatic mitral (valve) insufficiency: Secondary | ICD-10-CM | POA: Insufficient documentation

## 2013-07-08 DIAGNOSIS — R0600 Dyspnea, unspecified: Secondary | ICD-10-CM

## 2013-07-08 NOTE — Patient Instructions (Signed)
Your physician has requested that you have a lexiscan myoview. For further information please visit [email or web address]. Please follow instruction sheet, as given. Office will contact with results via phone or letter.   Echo - due in 1 year - reminder will be mailed Continue all current medications. Your physician wants you to follow up in: 6 months.  You will receive a reminder letter in the mail one-two months in advance.  If you don't receive a letter, please call our office to schedule the follow up appointment

## 2013-07-08 NOTE — Progress Notes (Signed)
Patient ID: Autumn Dean, female   DOB: Mar 10, 1933, 79 y.o.   MRN: [id number]    CARDIOLOGY CONSULT NOTE  Patient ID: Autumn Dean MRN: [id number] DOB/AGE: August 21, 1933 79 y.o.  Primary Physician: Autumn Dean Reason for Consultation: valvular heart disease  HPI:  Autumn Dean has a PMH significant for HTN, GERD, and ulcerative colitis. She recently underwent an echocardiogram which showed normal LV systolic function, EF 82-50%, moderate mitral regurgitation, mild to moderate tricuspid regurgitation, RVSP 45-55 mmHg, RA/RV upper limits normal to mildly dilated, and mild to moderate LAE. It was apparently ordered for a murmur.  She denies chest pain. She has felt somewhat fatigued and likes to mow the lawn, but thinks it may have been due to the hot weather. She occasionally gets short of breath when she walks to and from the mailbox. She denies leg swelling.  She didn't take her antihypertensives this morning. She was anxious prior to her arrival. She checks her BP at home and it appears to be well controlled.  She is a retired Marine scientist.    Allergies  Allergen Reactions  . Codeine Rash  . Demerol Rash    Current Outpatient Prescriptions  Medication Sig Dispense Refill  . bisoprolol-hydrochlorothiazide (ZIAC) 5-6.25 MG per tablet Take 1 tablet by mouth daily.      . Calcium Carbonate-Vit D-Min (CALCIUM 600+D PLUS MINERALS) 600-400 MG-UNIT TABS Take by mouth 2 (two) times daily.       . cyanocobalamin 500 MCG tablet Take 500 mcg by mouth daily.       . folic acid (FOLVITE) 1 MG tablet TAKE 1 TABLET EVERY DAY  90 tablet  3  . omeprazole (PRILOSEC OTC) 20 MG tablet Take 1 tablet (20 mg total) by mouth daily.  90 tablet  3  . Probiotic Product (PROBIOTIC DAILY PO) Take by mouth.      . psyllium (METAMUCIL) 58.6 % packet Take 1 packet by mouth daily.      Marland Kitchen sulfaSALAzine (AZULFIDINE) 500 MG EC tablet Take 500 mg by mouth 3 (three) times daily.       . [DISCONTINUED] fluticasone (FLONASE) 50  MCG/ACT nasal spray Place 2 sprays into the nose daily.      . [DISCONTINUED] sucralfate (CARAFATE) 1 GM/10ML suspension Take 10 mLs (1 g total) by mouth 4 (four) times daily.  420 mL  1   No current facility-administered medications for this visit.    Past Medical History  Diagnosis Date  . UC (ulcerative colitis confined to rectum)   . GERD (gastroesophageal reflux disease)   . Hiatal hernia   . Proctitis   . Hypertension     Past Surgical History  Procedure Laterality Date  . Esophagogastroduodenoscopy  12/18/2011    Procedure: ESOPHAGOGASTRODUODENOSCOPY (EGD);  Surgeon: Autumn Houston, MD;  Location: [facility];  Service: Endoscopy;  Laterality: N/A;  200  . Tonsillectomy    . Colonoscopy    . Dilation and curettage of uterus    . Colonoscopy  08/18/2012    Procedure: COLONOSCOPY;  Surgeon: Autumn Houston, MD;  Location: [facility];  Service: Endoscopy;  Laterality: N/A;  1200    History   Social History  . Marital Status: Single    Spouse Name: N/A    Number of Children: N/A  . Years of Education: N/A   Occupational History  . Not on file.   Social History Main Topics  . Smoking status: Never Smoker   . Smokeless tobacco: Never Used  .  Alcohol Use: No  . Drug Use: No  . Sexual Activity: Not on file   Other Topics Concern  . Not on file   Social History Narrative  . No narrative on file     Family History  Problem Relation Age of Onset  . Colon cancer Neg Hx        Review of systems complete and found to be negative unless listed above in HPI     Physical exam Blood pressure 149/83, pulse 79, height 5' 5"  (1.651 m), weight 148 lb 12.8 oz (67.495 kg). General: NAD Neck: No JVD, no thyromegaly or thyroid nodule.  Lungs: Clear to auscultation bilaterally with normal respiratory effort. CV: Nondisplaced PMI.  Heart regular S1/S2, no S3/S4, II/VI holosystolic murmur at RUSB, and III/VI holosystolic murmur at cardiac apex, best appreciated in  left lateral position, without radiation to axilla.  No peripheral edema.  No carotid bruit.  Normal pedal pulses.  Abdomen: Soft, nontender, no hepatosplenomegaly, no distention.  Skin: Intact without lesions or rashes.  Neurologic: Alert and oriented x 3.  Psych: Normal affect. Extremities: No clubbing or cyanosis.  HEENT: Normal.   Labs:     EKG: Sinus rhythm, 79 bpm, axis within normal limits, intervals within normal limits, no acute ST-T wave changes.  Studies: see Echo above   ASSESSMENT AND PLAN:  1. Mitral regurgitation: by the echocardiography report and auscultation, it appears to be at best moderate in severity. I do not feel a TEE is necessarily warranted at this time. I plan to repeat her echocardiogram in one year. She occasionally feels mildly fatigued, but this is seldom and she has been restricting her activities due to the anxiety related to her diagnosis of valvular heart disease. Additionally, she has normal systolic function and normal LV size.  In order to decrease afterload and the severity of her regurgitation, I recommend optimal BP control. The valves were reportedly thickened and this is likely the etiology of her valve disease. To exclude an ischemic etiology, I will obtain a Lexiscan Cardiolite stress test, and to also ensure that her fatigue isn't an anginal equivalent. 2. Tricuspid regurgitation/pulmonary hypertension: given the moderate severity of her mitral regurgitation, it is less likely that her RVSP of 45-55 mmHg is related to this. However, I will repeat echocardiography in 1 year and f/u with her in 6 months for clinical evaluation. This is likely due to intrinsic valve disease. 3. Fatigue: Lexiscan Cardiolite stress test as mentioned above.   Signed: Kate Dean, M.D., F.A.C.C. 07/08/2013, 8:49 AM

## 2013-07-11 ENCOUNTER — Telehealth: Payer: Self-pay | Admitting: *Deleted

## 2013-07-11 NOTE — Telephone Encounter (Signed)
Patient left message on voice mail - wants to do treadmill stress test instead of lexiscan.  Stated she looked up the side effects & scared her to death.  Please advise.

## 2013-07-11 NOTE — Telephone Encounter (Signed)
The reason I ordered a Lexiscan is because I'm not certain she'll be able to exercise vigorously enough to obtain optimal data. However, I will concede to her preference if that is what she wishes.

## 2013-07-13 NOTE — Telephone Encounter (Signed)
Left message to return call 

## 2013-07-14 ENCOUNTER — Encounter (HOSPITAL_COMMUNITY)
Admission: RE | Admit: 2013-07-14 | Discharge: 2013-07-14 | Disposition: A | Discharge status: Home / Self Care | Payer: Medicare Other | Source: Ambulatory Visit | Attending: Cardiovascular Disease | Admitting: Cardiovascular Disease

## 2013-07-14 ENCOUNTER — Encounter (HOSPITAL_COMMUNITY): Payer: Self-pay

## 2013-07-14 DIAGNOSIS — I059 Rheumatic mitral valve disease, unspecified: Secondary | ICD-10-CM | POA: Insufficient documentation

## 2013-07-14 DIAGNOSIS — R0989 Other specified symptoms and signs involving the circulatory and respiratory systems: Secondary | ICD-10-CM | POA: Insufficient documentation

## 2013-07-14 DIAGNOSIS — I340 Nonrheumatic mitral (valve) insufficiency: Secondary | ICD-10-CM

## 2013-07-14 DIAGNOSIS — R5383 Other fatigue: Secondary | ICD-10-CM

## 2013-07-14 DIAGNOSIS — R0600 Dyspnea, unspecified: Secondary | ICD-10-CM

## 2013-07-14 DIAGNOSIS — R0602 Shortness of breath: Secondary | ICD-10-CM

## 2013-07-14 DIAGNOSIS — R0609 Other forms of dyspnea: Secondary | ICD-10-CM | POA: Insufficient documentation

## 2013-07-14 DIAGNOSIS — R079 Chest pain, unspecified: Secondary | ICD-10-CM | POA: Insufficient documentation

## 2013-07-14 MED ORDER — SODIUM CHLORIDE 0.9 % IJ SOLN
INTRAMUSCULAR | Status: AC
  Administered 2013-07-14: 10 mL via INTRAVENOUS
  Filled 2013-07-14: qty 10

## 2013-07-14 MED ORDER — TECHNETIUM TC 99M SESTAMIBI - CARDIOLITE
10.0000 | Freq: Once | INTRAVENOUS | Status: AC | PRN
  Administered 2013-07-14: 09:00:00 10 via INTRAVENOUS

## 2013-07-14 MED ORDER — REGADENOSON 0.4 MG/5ML IV SOLN
INTRAVENOUS | Status: AC
  Administered 2013-07-14: 0.4 mg via INTRAVENOUS
  Filled 2013-07-14: qty 5

## 2013-07-14 MED ORDER — TECHNETIUM TC 99M SESTAMIBI - CARDIOLITE
30.0000 | Freq: Once | INTRAVENOUS | Status: AC | PRN
  Administered 2013-07-14: 11:00:00 30 via INTRAVENOUS

## 2013-07-14 NOTE — Telephone Encounter (Signed)
Patient left message on voice mail (07/13/2013-12:38) - stated she had changed her mind & is planning to go ahead with Lexiscan as previously discussed.

## 2013-07-14 NOTE — Progress Notes (Signed)
Stress Lab Nurses Notes - [place] 07/14/2013 Reason for doing test: Chest Pain and Dyspnea Type of test: Wille Glaser Nurse performing test: Gerrit Halls, RN Nuclear Medicine Tech: Melburn Hake Echo Tech: Not Applicable MD performing test: Dr. Jefm Bryant MD: Dr. Woody Seller Test explained and consent signed: yes IV started: 22g jelco, Saline lock flushed, No redness or edema and Saline lock started in radiology Symptoms: Flush & Treatment/Intervention: None Reason test stopped: protocol completed After recovery IV was: Discontinued via X-ray tech and No redness or edema Patient to return to [place]. Med at : 12:00 Patient discharged: Home Patient's Condition upon discharge was: stable Comments: During test BP 182/68 & HR 112. Recovery BP 156/71 & HR 84.  Symptoms resolved in recovery. Geanie Cooley T

## 2013-07-18 ENCOUNTER — Encounter: Payer: Self-pay | Admitting: *Deleted

## 2013-10-18 ENCOUNTER — Encounter: Payer: Self-pay | Admitting: Cardiovascular Disease

## 2013-10-31 ENCOUNTER — Telehealth (INDEPENDENT_AMBULATORY_CARE_PROVIDER_SITE_OTHER): Payer: Self-pay | Admitting: *Deleted

## 2013-10-31 NOTE — Telephone Encounter (Signed)
Needs new rx for prilosec OTC 20 mg bid instead of qd

## 2013-11-01 NOTE — Telephone Encounter (Signed)
A prescription for the Prilosec 20 mg - take 1 by mouth twice a day #60 with 11 refills,[facility] [place]. A message was left on the Pharmacist voicemail. Also ask that they call our office to confirm they rec'd the prescription request.

## 2013-12-30 ENCOUNTER — Other Ambulatory Visit (INDEPENDENT_AMBULATORY_CARE_PROVIDER_SITE_OTHER): Payer: Self-pay | Admitting: Internal Medicine

## 2014-01-02 ENCOUNTER — Ambulatory Visit: Payer: Medicare Other | Admitting: Cardiovascular Disease

## 2014-01-16 ENCOUNTER — Other Ambulatory Visit (INDEPENDENT_AMBULATORY_CARE_PROVIDER_SITE_OTHER): Payer: Self-pay | Admitting: Internal Medicine

## 2014-01-19 ENCOUNTER — Encounter: Payer: Self-pay | Admitting: Cardiovascular Disease

## 2014-01-19 ENCOUNTER — Ambulatory Visit (INDEPENDENT_AMBULATORY_CARE_PROVIDER_SITE_OTHER): Payer: Medicare Other | Admitting: Cardiovascular Disease

## 2014-01-19 VITALS — BP 143/84 | HR 69 | Ht 65.0 in | Wt 146.0 lb

## 2014-01-19 DIAGNOSIS — I059 Rheumatic mitral valve disease, unspecified: Secondary | ICD-10-CM

## 2014-01-19 DIAGNOSIS — I071 Rheumatic tricuspid insufficiency: Secondary | ICD-10-CM

## 2014-01-19 DIAGNOSIS — I2789 Other specified pulmonary heart diseases: Secondary | ICD-10-CM

## 2014-01-19 DIAGNOSIS — K219 Gastro-esophageal reflux disease without esophagitis: Secondary | ICD-10-CM

## 2014-01-19 DIAGNOSIS — I272 Pulmonary hypertension, unspecified: Secondary | ICD-10-CM

## 2014-01-19 DIAGNOSIS — I10 Essential (primary) hypertension: Secondary | ICD-10-CM

## 2014-01-19 DIAGNOSIS — I079 Rheumatic tricuspid valve disease, unspecified: Secondary | ICD-10-CM

## 2014-01-19 DIAGNOSIS — I340 Nonrheumatic mitral (valve) insufficiency: Secondary | ICD-10-CM

## 2014-01-19 NOTE — Progress Notes (Signed)
Patient ID: Autumn Dean, female   DOB: [birth year], 80 y.o.   MRN: [id number]      SUBJECTIVE: Autumn Dean has a PMH significant for HTN, GERD, and ulcerative colitis. She underwent an echocardiogram on 06/20/2013 which showed normal LV systolic function, EF 51-76%, moderate mitral regurgitation, mild to moderate tricuspid regurgitation, RVSP 45-55 mmHg, RA/RV upper limits normal to mildly dilated, and mild to moderate LAE. She had a normal Lexiscan Cardiolite in 07/2013, performed for the evaluation of fatigue and dyspnea. She has been having issues related to her stomach and esophagus, primarily related to GERD and gastritis. She has also been battling a bronchitis. She denies leg swelling, orthopnea, dizziness, lightheadedness, and PND.  She is a retired [employer] scientist.        Allergies  Allergen Reactions  . Codeine Rash  . Demerol Rash    Current Outpatient Prescriptions  Medication Sig Dispense Refill  . bisoprolol-hydrochlorothiazide (ZIAC) 5-6.25 MG per tablet Take 1 tablet by mouth daily.      . Calcium Carbonate-Vit D-Min (CALCIUM 600+D PLUS MINERALS) 600-400 MG-UNIT TABS Take by mouth 2 (two) times daily.       . cyanocobalamin 500 MCG tablet Take 500 mcg by mouth daily.       . folic acid (FOLVITE) 1 MG tablet TAKE 1 TABLET EVERY DAY  90 tablet  3  . omeprazole (PRILOSEC OTC) 20 MG tablet Take 20 mg by mouth daily.      . psyllium (METAMUCIL) 58.6 % packet Take 1 packet by mouth daily.      Marland Kitchen sulfaSALAzine (AZULFIDINE) 500 MG EC tablet Take 500 mg by mouth 3 (three) times daily.       . [DISCONTINUED] fluticasone (FLONASE) 50 MCG/ACT nasal spray Place 2 sprays into the nose daily.      . [DISCONTINUED] sucralfate (CARAFATE) 1 GM/10ML suspension Take 10 mLs (1 g total) by mouth 4 (four) times daily.  420 mL  1   No current facility-administered medications for this visit.    Past Medical History  Diagnosis Date  . UC (ulcerative colitis confined to rectum)   . GERD  (gastroesophageal reflux disease)   . Hiatal hernia   . Proctitis   . Hypertension     Past Surgical History  Procedure Laterality Date  . Esophagogastroduodenoscopy  12/18/2011    Procedure: ESOPHAGOGASTRODUODENOSCOPY (EGD);  Surgeon: Rogene Houston, MD;  Location: [facility];  Service: Endoscopy;  Laterality: N/A;  200  . Tonsillectomy    . Colonoscopy    . Dilation and curettage of uterus    . Colonoscopy  08/18/2012    Procedure: COLONOSCOPY;  Surgeon: Rogene Houston, MD;  Location: [facility];  Service: Endoscopy;  Laterality: N/A;  1200    History   Social History  . Marital Status: Single    Spouse Name: N/A    Number of Children: N/A  . Years of Education: N/A   Occupational History  . Not on file.   Social History Main Topics  . Smoking status: Never Smoker   . Smokeless tobacco: Never Used  . Alcohol Use: No  . Drug Use: No  . Sexual Activity: Not on file   Other Topics Concern  . Not on file   Social History Narrative  . No narrative on file     Filed Vitals:   01/19/14 1131  BP: 143/84  Pulse: 69  Height: 5' 5"  (1.651 m)  Weight: 146 lb (66.225 kg)  SpO2: 98%  PHYSICAL EXAM General: NAD  Neck: No JVD, no thyromegaly or thyroid nodule.  Lungs: Clear to auscultation bilaterally with normal respiratory effort.  CV: Nondisplaced PMI. Heart regular S1/S2, no G8/L1, I/VI holosystolic murmur at RUSB, and II/VI holosystolic murmur at cardiac apex, best appreciated in left lateral position, without radiation to axilla. No peripheral edema. No carotid bruit. Normal pedal pulses.  Abdomen: Soft, nontender, no hepatosplenomegaly, no distention.  Skin: Intact without lesions or rashes.  Neurologic: Alert and oriented x 3.  Psych: Normal affect.  Extremities: No clubbing or cyanosis.  HEENT: Normal.    ECG: reviewed and available in electronic records.      ASSESSMENT AND PLAN: 1. Mitral regurgitation: by echocardiography report and  auscultation, it appears to be at best moderate in severity. I do not feel a TEE is necessarily warranted at this time. I plan to repeat her echocardiogram in 6 months. She has normal systolic function and normal LV size. In order to decrease afterload and the severity of her regurgitation, I recommend optimal BP control. The valves were reportedly thickened and this is likely the etiology of her valve disease. 2. Tricuspid regurgitation/pulmonary hypertension: given the moderate severity of her mitral regurgitation, it is less likely that her RVSP of 45-55 mmHg is related to this. However, I will repeat echocardiography in 6 months. This is likely due to intrinsic valve disease.   Dispo: f/u 6 months with an echo prior to appt.  Autumn Dean, M.D., F.A.C.C.

## 2014-01-19 NOTE — Patient Instructions (Signed)
Continue all current medications. Your physician wants you to follow up in: 6 months.  You will receive a reminder letter in the mail one-two months in advance.  If you don't receive a letter, please call our office to schedule the follow up appointment  Echo - just prior to next office visit

## 2014-01-30 ENCOUNTER — Encounter (INDEPENDENT_AMBULATORY_CARE_PROVIDER_SITE_OTHER): Payer: Self-pay | Admitting: Internal Medicine

## 2014-01-30 ENCOUNTER — Ambulatory Visit (INDEPENDENT_AMBULATORY_CARE_PROVIDER_SITE_OTHER): Payer: Medicare Other | Admitting: Internal Medicine

## 2014-01-30 VITALS — BP 140/72 | HR 68 | Temp 97.5°F | Resp 18

## 2014-01-30 DIAGNOSIS — K219 Gastro-esophageal reflux disease without esophagitis: Secondary | ICD-10-CM

## 2014-01-30 DIAGNOSIS — K5190 Ulcerative colitis, unspecified, without complications: Secondary | ICD-10-CM

## 2014-01-30 NOTE — Progress Notes (Signed)
Presenting complaint;  Followup for ulcerative colitis.  Subjective:  Patient is 80 years old Caucasian female who has chronic ulcerative colitis and is there for scheduled visit. She is not having any bowel problems. She has home stool daily. She denies melena or rectal bleeding nausea or vomiting. Last fall she developed pain under right rib cage which she describes as spinning and needles. She was seen by Dr. Woody Seller and chest film was negative. Pain gradually resolved she did have some pain this morning. This pain is never severe or associated with nausea vomiting cough or diarrhea. She states she's been exercising and doing stretches and wonders if this was a source of pain. She also complains of intermittent bloating. The symptom is not associated with abdominal pain. She has lost 6 pounds since her last visit.         Current Medications: Outpatient Encounter Prescriptions as of 01/30/2014  Medication Sig  . bisoprolol-hydrochlorothiazide (ZIAC) 5-6.25 MG per tablet Take 1 tablet by mouth daily.  . Calcium Carbonate-Vit D-Min (CALCIUM 600+D PLUS MINERALS) 600-400 MG-UNIT TABS Take by mouth 2 (two) times daily.   . cyanocobalamin 500 MCG tablet Take 500 mcg by mouth daily.   . folic acid (FOLVITE) 1 MG tablet TAKE 1 TABLET EVERY DAY  . omeprazole (PRILOSEC OTC) 20 MG tablet Take 20 mg by mouth daily.  . Probiotic Product (PROBIOTIC DAILY PO) Take by mouth daily.  . psyllium (METAMUCIL) 58.6 % packet Take 1 packet by mouth daily.  Marland Kitchen sulfaSALAzine (AZULFIDINE) 500 MG EC tablet Take 500 mg by mouth 3 (three) times daily.      Objective: Blood pressure 140/72, pulse 68, temperature 97.5 F (36.4 C), temperature source Oral, resp. rate 18. Patient is alert and in no acute distress. Conjunctiva is pink. Sclera is nonicteric Oropharyngeal mucosa is normal. No neck masses or thyromegaly noted. Cardiac exam with regular rhythm normal S1 and S2. She has grade 3/6 systolic ejection murmur  best heard at aortic area. Lungs are clear to auscultation. Abdomen is symmetrical soft and nontender without organomegaly or masses. No LE edema or clubbing noted.  Labs/studies Results: Lab data from 10/19/2013 reviewed. WBC 3.8, H&H 12.3 and 36.0 and platelet count 245K  TSH 3.48 Electrolytes within normal limits. BUN 15. Creatinine 0.9 followup. Glucose 85. Bilirubin 0.3, AP 97, AST 22, ALT 11, total protein 6.5, albumin 4.4 and calcium 9.4   Assessment:  #1. Chronic ulcerative colitis. Patient remains in remission. Last colonoscopy was in October 2013. #2. History of LUQ abdominal pain which has resolved. Her story suggests musculoskeletal pain. Will consider further workup if pain recurs. #3. GERD. Heartburn is well controlled with PPI.  Plan:  Continue sulfasalazine at current dose which is 500 mg 3 times a day. Continue folate acid 1 mg by mouth daily. Call if LUQ abdominal recurrs. Office visit in one year.

## 2014-01-30 NOTE — Patient Instructions (Signed)
Notify if left upper quadrant abdominal recurs.

## 2014-02-01 ENCOUNTER — Telehealth (INDEPENDENT_AMBULATORY_CARE_PROVIDER_SITE_OTHER): Payer: Self-pay | Admitting: *Deleted

## 2014-02-01 NOTE — Telephone Encounter (Signed)
Patient advised her Rx has been called in. Vocies understood.

## 2014-02-01 NOTE — Telephone Encounter (Signed)
Autumn Dean said Dr. Laural Golden was going to call her in a new Rx for [name]. [facility]. She has none left. Her return phone number is [phone].

## 2014-02-01 NOTE — Telephone Encounter (Signed)
[facility] and a new Rx has been given per Dr.Rehman's office note.

## 2014-02-14 ENCOUNTER — Ambulatory Visit (INDEPENDENT_AMBULATORY_CARE_PROVIDER_SITE_OTHER): Payer: Medicare Other | Admitting: Internal Medicine

## 2014-04-27 ENCOUNTER — Encounter (INDEPENDENT_AMBULATORY_CARE_PROVIDER_SITE_OTHER): Payer: Self-pay

## 2014-05-17 ENCOUNTER — Other Ambulatory Visit: Payer: Self-pay | Admitting: *Deleted

## 2014-05-17 DIAGNOSIS — I340 Nonrheumatic mitral (valve) insufficiency: Secondary | ICD-10-CM

## 2014-07-26 ENCOUNTER — Telehealth (INDEPENDENT_AMBULATORY_CARE_PROVIDER_SITE_OTHER): Payer: Self-pay | Admitting: *Deleted

## 2014-07-26 ENCOUNTER — Other Ambulatory Visit (INDEPENDENT_AMBULATORY_CARE_PROVIDER_SITE_OTHER): Payer: Medicare Other

## 2014-07-26 ENCOUNTER — Other Ambulatory Visit: Payer: Self-pay

## 2014-07-26 DIAGNOSIS — I340 Nonrheumatic mitral (valve) insufficiency: Secondary | ICD-10-CM

## 2014-07-26 DIAGNOSIS — I059 Rheumatic mitral valve disease, unspecified: Secondary | ICD-10-CM

## 2014-07-26 NOTE — Telephone Encounter (Signed)
Patient was called and a message was left for her to call me back.

## 2014-07-26 NOTE — Telephone Encounter (Signed)
Would like to speak with Autumn Dean about her medicine PRILOSEC OTC. She is having problems with reflux. The return phone number is [phone].

## 2014-07-26 NOTE — Telephone Encounter (Signed)
Patient states that she has been taking Prilosec OTC 20 mg 1 by mouth daily. Up until recently this has helped her. She is wanting to go back to taking 2 by mouth daily. Her reflux is a little worse and she states that she has actually started doing this with what she already has on hand, and this is helping. Per Dr.Rehman this is okay. Prilosec OTC 20 mg Take 1 by mouth daily #60 with 5 refills. [facility] [place]. Patient made aware.

## 2014-07-28 ENCOUNTER — Telehealth: Payer: Self-pay | Admitting: *Deleted

## 2014-07-28 NOTE — Telephone Encounter (Signed)
Message copied by Laurine Blazer on Fri Jul 28, 2014 12:00 PM ------      Message from: Kate Sable A      Created: Wed Jul 26, 2014  4:30 PM       Mild mitral regurgitation with stable pulmonary pressures and normal LV systolic function. ------

## 2014-07-28 NOTE — Telephone Encounter (Signed)
Notes Recorded by Laurine Blazer, LPN on 1/83/4373 at 57:89 PM Patient notified. 6 mo f/u scheduled for 08/01/14 at 3:40 with Dr. Bronson Ing.

## 2014-08-01 ENCOUNTER — Encounter: Payer: Self-pay | Admitting: Cardiovascular Disease

## 2014-08-01 ENCOUNTER — Ambulatory Visit (INDEPENDENT_AMBULATORY_CARE_PROVIDER_SITE_OTHER): Payer: Medicare Other | Admitting: Cardiovascular Disease

## 2014-08-01 VITALS — BP 131/71 | HR 66 | Ht 65.0 in | Wt 149.5 lb

## 2014-08-01 DIAGNOSIS — I071 Rheumatic tricuspid insufficiency: Secondary | ICD-10-CM

## 2014-08-01 DIAGNOSIS — I2789 Other specified pulmonary heart diseases: Secondary | ICD-10-CM

## 2014-08-01 DIAGNOSIS — I10 Essential (primary) hypertension: Secondary | ICD-10-CM

## 2014-08-01 DIAGNOSIS — I272 Pulmonary hypertension, unspecified: Secondary | ICD-10-CM

## 2014-08-01 DIAGNOSIS — K219 Gastro-esophageal reflux disease without esophagitis: Secondary | ICD-10-CM

## 2014-08-01 DIAGNOSIS — I340 Nonrheumatic mitral (valve) insufficiency: Secondary | ICD-10-CM

## 2014-08-01 DIAGNOSIS — I059 Rheumatic mitral valve disease, unspecified: Secondary | ICD-10-CM

## 2014-08-01 DIAGNOSIS — I079 Rheumatic tricuspid valve disease, unspecified: Secondary | ICD-10-CM

## 2014-08-01 NOTE — Patient Instructions (Signed)
There were no changes to your medications. Continue as directed. Your physician wants you to follow up in:  1 year.  You will receive a reminder letter in the mail one-two months in advance.  If you don't receive a letter, please call our office to schedule the follow up appointment.

## 2014-08-01 NOTE — Progress Notes (Signed)
Patient ID: Autumn Dean, female   DOB: [birth year], 80 y.o.   MRN: [id number]      SUBJECTIVE: Autumn Dean has a PMH significant for HTN, GERD, and ulcerative colitis. She underwent an echocardiogram on 06/20/2013 which showed normal LV systolic function, EF 01-09%, moderate mitral regurgitation, mild to moderate tricuspid regurgitation, RVSP 45-55 mmHg, RA/RV upper limits normal to mildly dilated, and mild to moderate LAE.  She had a normal Lexiscan Cardiolite in 07/2013, performed for the evaluation of fatigue and dyspnea.   Follow up echocardiogram on 07/26/14 demonstrated normal left ventricular systolic function, EF 32-35%, grade 2 diastolic dysfunction, two mild mitral regurgitant jets, mild tricuspid regurgitation with pulmonary pressures 46 mm mercury.  She denies leg swelling, orthopnea, dizziness, lightheadedness, and PND.  She had some issues with GERD and recently asked Dr. Laural Golden to increase her dose of Prilosec. ECG performed in the office today demonstrates normal sinus rhythm with occasional PACs.  She is a retired Marine scientist.     Review of Systems: As per "subjective", otherwise negative.  Allergies  Allergen Reactions  . Codeine Rash  . Demerol Rash    Current Outpatient Prescriptions  Medication Sig Dispense Refill  . bisoprolol-hydrochlorothiazide (ZIAC) 5-6.25 MG per tablet Take 1 tablet by mouth daily.      . Calcium Carbonate-Vit D-Min (CALCIUM 600+D PLUS MINERALS) 600-400 MG-UNIT TABS Take by mouth 2 (two) times daily.       . cyanocobalamin 500 MCG tablet Take 500 mcg by mouth daily.       . folic acid (FOLVITE) 1 MG tablet TAKE 1 TABLET EVERY DAY  90 tablet  3  . omeprazole (PRILOSEC OTC) 20 MG tablet Take 20 mg by mouth daily.      . Probiotic Product (PROBIOTIC DAILY PO) Take by mouth daily.      . psyllium (METAMUCIL) 58.6 % packet Take 1 packet by mouth daily.      Marland Kitchen sulfaSALAzine (AZULFIDINE) 500 MG EC tablet Take 500 mg by mouth 3 (three) times daily.        . [DISCONTINUED] fluticasone (FLONASE) 50 MCG/ACT nasal spray Place 2 sprays into the nose daily.      . [DISCONTINUED] sucralfate (CARAFATE) 1 GM/10ML suspension Take 10 mLs (1 g total) by mouth 4 (four) times daily.  420 mL  1   No current facility-administered medications for this visit.    Past Medical History  Diagnosis Date  . UC (ulcerative colitis confined to rectum)   . GERD (gastroesophageal reflux disease)   . Hiatal hernia   . Proctitis   . Hypertension     Past Surgical History  Procedure Laterality Date  . Esophagogastroduodenoscopy  12/18/2011    Procedure: ESOPHAGOGASTRODUODENOSCOPY (EGD);  Surgeon: Rogene Houston, MD;  [facility];  Service: Endoscopy;  Laterality: N/A;  200  . Tonsillectomy    . Colonoscopy    . Dilation and curettage of uterus    . Colonoscopy  08/18/2012    Procedure: COLONOSCOPY;  Surgeon: Rogene Houston, MD;  [facility]: [facility];  Service: Endoscopy;  Laterality: N/A;  1200    History   Social History  . Marital Status: Single    Spouse Name: N/A    Number of Children: N/A  . Years of Education: N/A   Occupational History  . Not on file.   Social History Main Topics  . Smoking status: Never Smoker   . Smokeless tobacco: Never Used  . Alcohol Use: No  .  Drug Use: No  . Sexual Activity: Not on file   Other Topics Concern  . Not on file   Social History Narrative  . No narrative on file     Filed Vitals:   08/01/14 1539  BP: 131/71  Pulse: 66  Height: 5' 5"  (1.651 m)  Weight: 149 lb 8 oz (67.813 kg)  SpO2: 96%    PHYSICAL EXAM General: NAD  Neck: No JVD, no thyromegaly or thyroid nodule.  Lungs: Clear to auscultation bilaterally with normal respiratory effort.  CV: Nondisplaced PMI. Heart regular S1/S2, no Z6/X0, I/VI holosystolic murmur at RUSB, and II/VI holosystolic murmur at cardiac apex, without radiation to axilla. Trace peripheral edema. No carotid bruit.  Abdomen: Soft, nontender, no  distention.  Skin: Intact without lesions or rashes.  Neurologic: Alert and oriented.  Psych: Normal affect.  Skin: Normal. Musculoskeletal: Normal range of motion, no gross deformities. Extremities: No clubbing or cyanosis.   ECG: Most recent ECG reviewed.      ASSESSMENT AND PLAN: 1. Mitral regurgitation: Two mild mitral regurgitant jets by echo on 07/26/14, with normal LV systolic function and size. Continue present therapy. 2. Tricuspid regurgitation/pulmonary hypertension: Mild regurgitation with stable pulmonary pressures of 46 mmHg. No changes to treatment plan. 3. Essential HTN: Well controlled on bisoprolol-HCTZ 5-6.25 mg daily. 4. GERD: Taking Priolosec 20 mg daily.  Dispo: f/u 1 year.  Kate Sable, M.D., F.A.C.C.

## 2014-09-11 ENCOUNTER — Other Ambulatory Visit (INDEPENDENT_AMBULATORY_CARE_PROVIDER_SITE_OTHER): Payer: Self-pay | Admitting: Internal Medicine

## 2014-10-25 ENCOUNTER — Encounter (INDEPENDENT_AMBULATORY_CARE_PROVIDER_SITE_OTHER): Payer: Self-pay | Admitting: *Deleted

## 2014-11-07 ENCOUNTER — Encounter (INDEPENDENT_AMBULATORY_CARE_PROVIDER_SITE_OTHER): Payer: Self-pay | Admitting: *Deleted

## 2014-11-07 ENCOUNTER — Encounter (INDEPENDENT_AMBULATORY_CARE_PROVIDER_SITE_OTHER): Payer: Self-pay | Admitting: Internal Medicine

## 2014-11-07 ENCOUNTER — Ambulatory Visit (INDEPENDENT_AMBULATORY_CARE_PROVIDER_SITE_OTHER): Payer: Medicare Other | Admitting: Internal Medicine

## 2014-11-07 VITALS — BP 128/70 | HR 60 | Temp 97.5°F | Ht 63.0 in | Wt 146.6 lb

## 2014-11-07 DIAGNOSIS — K5120 Ulcerative (chronic) proctitis without complications: Secondary | ICD-10-CM

## 2014-11-07 DIAGNOSIS — R1314 Dysphagia, pharyngoesophageal phase: Secondary | ICD-10-CM

## 2014-11-07 DIAGNOSIS — K219 Gastro-esophageal reflux disease without esophagitis: Secondary | ICD-10-CM

## 2014-11-07 NOTE — Progress Notes (Signed)
Subjective:    Patient ID: Autumn Dean, female    DOB: [birth year], 81 y.o.   MRN: [id number]  HPIHere today for f/u of her chronic UC. She had had UC for almost 40 yrs. Diagnosed in [place], [place].  She tells me she is doing okay. Her appetite is good. No weight loss.   She does have gas occasionally. She occasionally has some upper left epigastric tenderness. She has pain under her left axilla and it radiates into her left shoulder at times.  If she drinks water, she sometimes will feel it coming back up in her mouth. She says foods are slow to go down.  Appetite is good. No weight loss. Usually has a BM every day. She chews her food well. She has stopped eat popcorn and peanuts.  She does not have acid reflux often. She does have a lot of flatus.  Her last visit was in March and was seen by Dr. Laural Golden. BMs are normal. No melena or BRRB.     12/18/2011 EGD: : Dr. Laural Golden:  Indications: Patient is 18 old Caucasian female with history of melena which cleared spontaneously. She complains of intermittent left upper quadrant pain relieved with Carafate. She is undergoing diagnostic EGD. She is on Nexium for GERD and she said is not working. She has chronic UC and has been in remission for the past few years. Impression: Mild changes of candida esophagitis. Brushing obtained for KOH prep. Small sliding hiatal hernia. Few small hyperplastic polyps in gastric body. These were left alone. No evidence of peptic ulcer disease or other gastric lesions.     KOH Prep FEW YEAST WITH PSEUDOHYPHAE Performed at [facility]     Report Status 12/18/2011 FINAL   Placed on Mycostatin   Procedure: Colonoscopy 08/18/2012 Indications: Patient is 81 year old Caucasian female with history of chronic ulcerative colitis whose last exam was over 4 years ago was noted to have heme positive stools. She is having normal bowel movements. She is undergoing diagnostic/surveillance  colonoscopy.  Impression:  Examination performed to cecum. Active colitis involving distal sigmoid colon and rectum (multiple erosions and friable mucosa). Biopsies taken. No evidence of polyps or mass.  Patient called and message/results left on her answering service. Will start on Canasa suppository 1 g per rectum each bedtime 30x1 refill.  Review of Systems Past Medical History  Diagnosis Date  . UC (ulcerative colitis confined to rectum)   . GERD (gastroesophageal reflux disease)   . Hiatal hernia   . Proctitis   . Hypertension     Past Surgical History  Procedure Laterality Date  . Esophagogastroduodenoscopy  12/18/2011    Procedure: ESOPHAGOGASTRODUODENOSCOPY (EGD);  Surgeon: Rogene Houston, MD;  [facility];  Service: Endoscopy;  Laterality: N/A;  200  . Tonsillectomy    . Colonoscopy    . Dilation and curettage of uterus    . Colonoscopy  08/18/2012    Procedure: COLONOSCOPY;  Surgeon: Rogene Houston, MD;  [facility];  Service: Endoscopy;  Laterality: N/A;  1200    Allergies  Allergen Reactions  . Codeine Rash  . Demerol Rash    Current Outpatient Prescriptions on File Prior to Visit  Medication Sig Dispense Refill  . bisoprolol-hydrochlorothiazide (ZIAC) 5-6.25 MG per tablet Take 1 tablet by mouth daily.    . Calcium Carbonate-Vit D-Min (CALCIUM 600+D PLUS MINERALS) 600-400 MG-UNIT TABS Take by mouth 2 (two) times daily.     . cyanocobalamin 500 MCG tablet  Take 500 mcg by mouth daily.     . folic acid (FOLVITE) 1 MG tablet TAKE 1 TABLET EVERY DAY 90 tablet 3  . PRILOSEC OTC 20 MG tablet TAKE 1 TABLET BY MOUTH TWICE DAILY (Patient taking differently: daily') 56 tablet 5  . Probiotic Product (PROBIOTIC DAILY PO) Take by mouth daily.    . psyllium (METAMUCIL) 58.6 % packet Take 1 packet by mouth daily.    Marland Kitchen sulfaSALAzine (AZULFIDINE) 500 MG EC tablet Take 500 mg by mouth 3 (three) times daily.     . [DISCONTINUED] fluticasone (FLONASE)  50 MCG/ACT nasal spray Place 2 sprays into the nose daily.    . [DISCONTINUED] sucralfate (CARAFATE) 1 GM/10ML suspension Take 10 mLs (1 g total) by mouth 4 (four) times daily. 420 mL 1   No current facility-administered medications on file prior to visit.        Objective:   Physical Exam  Filed Vitals:   11/07/14 1418  Height: 5' 3"  (1.6 m)  Weight: 146 lb 9.6 oz (66.497 kg)  Alert and oriented. Skin warm and dry. Oral mucosa is moist.   . Sclera anicteric, conjunctivae is pink. Thyroid not enlarged. No cervical lymphadenopathy. Lungs clear. Heart regular rate and rhythm.  Abdomen is soft. Bowel sounds are positive. No hepatomegaly. No abdominal masses felt. Slight left upper quadrant tenderness.  1+ edema to lower extremities.          Assessment & Plan:  GERD controlled for the most part. Occasionally has to take Mylanta at night.  UC: She seems to be in remission.  CBC, Hepatic function, sedrate OV in 6 months

## 2014-11-07 NOTE — Patient Instructions (Addendum)
DG Esophagus Further recommendations to follow, CBC and Hepatic function today.

## 2014-11-08 LAB — HEPATIC FUNCTION PANEL
ALT: 12 U/L (ref 0–35)
AST: 21 U/L (ref 0–37)
Albumin: 4.1 g/dL (ref 3.5–5.2)
Alkaline Phosphatase: 105 U/L (ref 39–117)
Bilirubin, Direct: 0.1 mg/dL (ref 0.0–0.3)
Indirect Bilirubin: 0.3 mg/dL (ref 0.2–1.2)
Total Bilirubin: 0.4 mg/dL (ref 0.2–1.2)
Total Protein: 6.5 g/dL (ref 6.0–8.3)

## 2014-11-08 LAB — CBC WITH DIFFERENTIAL/PLATELET
Basophils Absolute: 0 10*3/uL (ref 0.0–0.1)
Basophils Relative: 0 % (ref 0–1)
Eosinophils Absolute: 0 10*3/uL (ref 0.0–0.7)
Eosinophils Relative: 0 % (ref 0–5)
HCT: 36.3 % (ref 36.0–46.0)
Hemoglobin: 12.3 g/dL (ref 12.0–15.0)
Lymphocytes Relative: 36 % (ref 12–46)
Lymphs Abs: 1.6 10*3/uL (ref 0.7–4.0)
MCH: 28.7 pg (ref 26.0–34.0)
MCHC: 33.9 g/dL (ref 30.0–36.0)
MCV: 84.8 fL (ref 78.0–100.0)
MPV: 10.8 fL (ref 8.6–12.4)
Monocytes Absolute: 0.4 10*3/uL (ref 0.1–1.0)
Monocytes Relative: 8 % (ref 3–12)
Neutro Abs: 2.5 10*3/uL (ref 1.7–7.7)
Neutrophils Relative %: 56 % (ref 43–77)
Platelets: 288 10*3/uL (ref 150–400)
RBC: 4.28 MIL/uL (ref 3.87–5.11)
RDW: 12.9 % (ref 11.5–15.5)
WBC: 4.5 10*3/uL (ref 4.0–10.5)

## 2014-11-08 LAB — SEDIMENTATION RATE: Sed Rate: 10 mm/hr (ref 0–22)

## 2014-11-10 ENCOUNTER — Ambulatory Visit (HOSPITAL_COMMUNITY)
Admission: RE | Admit: 2014-11-10 | Discharge: 2014-11-10 | Disposition: A | Discharge status: Home / Self Care | Payer: Medicare Other | Source: Ambulatory Visit | Attending: Internal Medicine | Admitting: Internal Medicine

## 2014-11-10 DIAGNOSIS — R1314 Dysphagia, pharyngoesophageal phase: Secondary | ICD-10-CM | POA: Insufficient documentation

## 2014-12-05 ENCOUNTER — Other Ambulatory Visit (INDEPENDENT_AMBULATORY_CARE_PROVIDER_SITE_OTHER): Payer: Self-pay | Admitting: Internal Medicine

## 2014-12-25 ENCOUNTER — Telehealth (INDEPENDENT_AMBULATORY_CARE_PROVIDER_SITE_OTHER): Payer: Self-pay | Admitting: *Deleted

## 2014-12-25 NOTE — Telephone Encounter (Signed)
Autumn Dean would like to speak with [name] about her medicine PRILOSEC. She is currently only taking 20 mg daily. Her return phone number is [phone].

## 2014-12-25 NOTE — Telephone Encounter (Signed)
Patient is wanting [place] to know that she is taking Prilosec 20 mg daily.It is currently working good. When she was taking this twice daily , the pm dose was making her sleepy. Patient feels that we will be getting request for this and Folic Acid , Sulfasalazine from [facility] in March.

## 2015-02-01 ENCOUNTER — Ambulatory Visit (INDEPENDENT_AMBULATORY_CARE_PROVIDER_SITE_OTHER): Payer: Medicare Other | Admitting: Internal Medicine

## 2015-03-20 ENCOUNTER — Encounter (INDEPENDENT_AMBULATORY_CARE_PROVIDER_SITE_OTHER): Payer: Self-pay | Admitting: *Deleted

## 2015-04-08 ENCOUNTER — Other Ambulatory Visit (INDEPENDENT_AMBULATORY_CARE_PROVIDER_SITE_OTHER): Payer: Self-pay | Admitting: Internal Medicine

## 2015-04-09 ENCOUNTER — Other Ambulatory Visit (INDEPENDENT_AMBULATORY_CARE_PROVIDER_SITE_OTHER): Payer: Self-pay | Admitting: Internal Medicine

## 2015-04-09 MED ORDER — OMEPRAZOLE MAGNESIUM 20 MG PO TBEC: 20.0000 mg | DELAYED_RELEASE_TABLET | Freq: Two times a day (BID) | ORAL | Status: DC

## 2015-04-09 NOTE — Telephone Encounter (Signed)
Rx sent to pharmacy for Omeprazole

## 2015-04-20 ENCOUNTER — Telehealth: Payer: Self-pay | Admitting: Cardiovascular Disease

## 2015-04-20 NOTE — Telephone Encounter (Signed)
Wants to know if she needs to have an ECHO prior her visit with [place] in Sept

## 2015-04-20 NOTE — Telephone Encounter (Signed)
Pt made appt for 07/26/15 6 month f/u. Will not schedule echo until next office visit per last office note. Pt aware.

## 2015-05-08 ENCOUNTER — Ambulatory Visit (INDEPENDENT_AMBULATORY_CARE_PROVIDER_SITE_OTHER): Payer: Medicare Other | Admitting: Internal Medicine

## 2015-05-08 ENCOUNTER — Encounter (INDEPENDENT_AMBULATORY_CARE_PROVIDER_SITE_OTHER): Payer: Self-pay | Admitting: Internal Medicine

## 2015-05-08 VITALS — BP 122/70 | HR 60 | Temp 98.0°F | Ht 65.0 in | Wt 143.4 lb

## 2015-05-08 DIAGNOSIS — R1030 Lower abdominal pain, unspecified: Secondary | ICD-10-CM | POA: Diagnosis not present

## 2015-05-08 NOTE — Progress Notes (Addendum)
Subjective:    Patient ID: Autumn Dean, female    DOB: 02/24/1933, 81 y.o.   MRN: [id number]  HPI Here today for f/u of her UC. She tells me she is doing good.  She tells me she has some abdominal pain (rt sided).  She has had the pain off and on x 3 weeks. She feels bloated at times.  She has a BM once a day and sometimes she has two stools. No melena or BRRB. Appetite is good. There has been no fever. No urinary symptoms     12/18/2011 EGD: : Dr. Laural Golden:  Indications: Patient is 62 old Caucasian female with history of melena which cleared spontaneously. She complains of intermittent left upper quadrant pain relieved with Carafate. She is undergoing diagnostic EGD. She is on Nexium for GERD and she said is not working. She has chronic UC and has been in remission for the past few years. Impression: Mild changes of candida esophagitis. Brushing obtained for KOH prep. Small sliding hiatal hernia. Few small hyperplastic polyps in gastric body. These were left alone. No evidence of peptic ulcer disease or other gastric lesions.     KOH Prep FEW YEAST WITH PSEUDOHYPHAE Performed at [facility]     Report Status 12/18/2011 FINAL   Placed on Mycostatin   Procedure: Colonoscopy 08/18/2012 Indications: Patient is 81 year old Caucasian female with history of chronic ulcerative colitis whose last exam was over 4 years ago was noted to have heme positive stools. She is having normal bowel movements. She is undergoing diagnostic/surveillance colonoscopy.  Impression:  Examination performed to cecum. Active colitis involving distal sigmoid colon and rectum (multiple erosions and friable mucosa). Biopsies taken. No evidence of polyps or mass.         Review of Systems       Past Medical History  Diagnosis Date  . UC (ulcerative colitis confined to rectum)   . GERD (gastroesophageal reflux disease)   . Hiatal hernia   . Proctitis   . Hypertension      Past Surgical History  Procedure Laterality Date  . Esophagogastroduodenoscopy  12/18/2011    Procedure: ESOPHAGOGASTRODUODENOSCOPY (EGD);  Surgeon: Rogene Houston, MD;  [facility];  Service: Endoscopy;  Laterality: N/A;  200  . Tonsillectomy    . Colonoscopy    . Dilation and curettage of uterus    . Colonoscopy  08/18/2012    Procedure: COLONOSCOPY;  Surgeon: Rogene Houston, MD;  [facility];  Service: Endoscopy;  Laterality: N/A;  1200    Allergies  Allergen Reactions  . Codeine Rash  . Demerol Rash    Current Outpatient Prescriptions on File Prior to Visit  Medication Sig Dispense Refill  . bisoprolol-hydrochlorothiazide (ZIAC) 5-6.25 MG per tablet Take 1 tablet by mouth daily.    . Calcium Carbonate-Vit D-Min (CALCIUM 600+D PLUS MINERALS) 600-400 MG-UNIT TABS Take by mouth 2 (two) times daily.     . cyanocobalamin 500 MCG tablet Take 500 mcg by mouth daily.     . folic acid (FOLVITE) 1 MG tablet TAKE 1 TABLET BY MOUTH EVERY DAY 90 tablet 3  . omeprazole (PRILOSEC) 20 MG capsule TAKE 1 CAPSULE BY MOUTH TWICE DAILY - 02/08/2015 CS. PER PT. ONLY TAKES 1 CAPSULE DAILY 180 capsule 3  . Probiotic Product (PROBIOTIC DAILY PO) Take by mouth daily.    . psyllium (METAMUCIL) 58.6 % packet Take 1 packet by mouth daily.    Marland Kitchen sulfaSALAzine (AZULFIDINE) 500 MG  EC tablet Take 500 mg by mouth 3 (three) times daily.     . [DISCONTINUED] fluticasone (FLONASE) 50 MCG/ACT nasal spray Place 2 sprays into the nose daily.    . [DISCONTINUED] sucralfate (CARAFATE) 1 GM/10ML suspension Take 10 mLs (1 g total) by mouth 4 (four) times daily. 420 mL 1   No current facility-administered medications on file prior to visit.        Objective:   Physical ExamBlood pressure 122/70, pulse 60, temperature 98 F (36.7 C), height 5' 5"  (1.651 m), weight 143 lb 6.4 oz (65.046 kg).  Alert and oriented. Skin warm and dry. Oral mucosa is moist.   . Sclera anicteric, conjunctivae  is pink. Thyroid not enlarged. No cervical lymphadenopathy. Lungs clear. Heart regular rate and rhythm.  Abdomen is soft. Bowel sounds are positive. No hepatomegaly. No abdominal masses felt. Slight rt lower quadrant tenderness.  1+ edema to lower extremities.         Assessment & Plan:  Rt lower Abdominal pain. ? Etiology. Hx of UC which is on the left Will get a CT abdomen/pelvis with CM. Continue the Sulfasalazine TID  CBC, Urinalysis. Further recommendations to follow

## 2015-05-08 NOTE — Patient Instructions (Signed)
Labs and CT. Further recommendations to follow

## 2015-05-09 LAB — CBC WITH DIFFERENTIAL/PLATELET
Basophils Absolute: 0 10*3/uL (ref 0.0–0.1)
Basophils Relative: 0 % (ref 0–1)
Eosinophils Absolute: 0 10*3/uL (ref 0.0–0.7)
Eosinophils Relative: 0 % (ref 0–5)
HCT: 39.1 % (ref 36.0–46.0)
Hemoglobin: 12.6 g/dL (ref 12.0–15.0)
Lymphocytes Relative: 32 % (ref 12–46)
Lymphs Abs: 1.4 10*3/uL (ref 0.7–4.0)
MCH: 28.4 pg (ref 26.0–34.0)
MCHC: 32.2 g/dL (ref 30.0–36.0)
MCV: 88.3 fL (ref 78.0–100.0)
MPV: 11.1 fL (ref 8.6–12.4)
Monocytes Absolute: 0.3 10*3/uL (ref 0.1–1.0)
Monocytes Relative: 7 % (ref 3–12)
Neutro Abs: 2.6 10*3/uL (ref 1.7–7.7)
Neutrophils Relative %: 61 % (ref 43–77)
Platelets: 273 10*3/uL (ref 150–400)
RBC: 4.43 MIL/uL (ref 3.87–5.11)
RDW: 13.5 % (ref 11.5–15.5)
WBC: 4.3 10*3/uL (ref 4.0–10.5)

## 2015-05-09 LAB — URINALYSIS
Bilirubin Urine: NEGATIVE
Glucose, UA: NEGATIVE mg/dL
Ketones, ur: NEGATIVE mg/dL
Leukocytes, UA: NEGATIVE
Nitrite: NEGATIVE
Protein, ur: NEGATIVE mg/dL
Specific Gravity, Urine: 1.008 (ref 1.005–1.030)
Urobilinogen, UA: 0.2 mg/dL (ref 0.0–1.0)
pH: 5 (ref 5.0–8.0)

## 2015-05-09 LAB — CREATININE, SERUM: Creat: 0.96 mg/dL (ref 0.50–1.10)

## 2015-05-11 ENCOUNTER — Ambulatory Visit (HOSPITAL_COMMUNITY)
Admission: RE | Admit: 2015-05-11 | Discharge: 2015-05-11 | Disposition: A | Discharge status: Home / Self Care | Payer: Medicare Other | Source: Ambulatory Visit | Attending: Internal Medicine | Admitting: Internal Medicine

## 2015-05-11 DIAGNOSIS — I700 Atherosclerosis of aorta: Secondary | ICD-10-CM | POA: Diagnosis not present

## 2015-05-11 DIAGNOSIS — I517 Cardiomegaly: Secondary | ICD-10-CM | POA: Diagnosis not present

## 2015-05-11 DIAGNOSIS — D259 Leiomyoma of uterus, unspecified: Secondary | ICD-10-CM | POA: Insufficient documentation

## 2015-05-11 DIAGNOSIS — R1030 Lower abdominal pain, unspecified: Secondary | ICD-10-CM | POA: Diagnosis present

## 2015-05-11 MED ORDER — IOHEXOL 300 MG/ML  SOLN
100.0000 mL | Freq: Once | INTRAMUSCULAR | Status: AC | PRN
  Administered 2015-05-11: 100 mL via INTRAVENOUS

## 2015-05-16 ENCOUNTER — Telehealth (INDEPENDENT_AMBULATORY_CARE_PROVIDER_SITE_OTHER): Payer: Self-pay | Admitting: Internal Medicine

## 2015-05-16 NOTE — Telephone Encounter (Signed)
Results given to patient

## 2015-05-21 ENCOUNTER — Encounter (INDEPENDENT_AMBULATORY_CARE_PROVIDER_SITE_OTHER): Payer: Self-pay | Admitting: Internal Medicine

## 2015-07-05 ENCOUNTER — Ambulatory Visit (INDEPENDENT_AMBULATORY_CARE_PROVIDER_SITE_OTHER): Payer: Medicare Other | Admitting: Cardiovascular Disease

## 2015-07-05 ENCOUNTER — Encounter: Payer: Self-pay | Admitting: Cardiovascular Disease

## 2015-07-05 VITALS — BP 132/82 | HR 61 | Ht 65.0 in | Wt 143.0 lb

## 2015-07-05 DIAGNOSIS — I071 Rheumatic tricuspid insufficiency: Secondary | ICD-10-CM

## 2015-07-05 DIAGNOSIS — I10 Essential (primary) hypertension: Secondary | ICD-10-CM

## 2015-07-05 DIAGNOSIS — M25473 Effusion, unspecified ankle: Secondary | ICD-10-CM

## 2015-07-05 DIAGNOSIS — I272 Pulmonary hypertension, unspecified: Secondary | ICD-10-CM

## 2015-07-05 DIAGNOSIS — I340 Nonrheumatic mitral (valve) insufficiency: Secondary | ICD-10-CM | POA: Diagnosis not present

## 2015-07-05 DIAGNOSIS — I270 Primary pulmonary hypertension: Secondary | ICD-10-CM | POA: Diagnosis not present

## 2015-07-05 NOTE — Progress Notes (Signed)
Patient ID: Autumn Dean, female   DOB: May 05, 1933, 81 y.o.   MRN: [id number]      SUBJECTIVE: Autumn Dean has a PMH significant for HTN, GERD, and ulcerative colitis. She underwent an echocardiogram on 06/20/2013 which showed normal LV systolic function, EF 74-25%, moderate mitral regurgitation, mild to moderate tricuspid regurgitation, RVSP 45-55 mmHg, RA/RV upper limits normal to mildly dilated, and mild to moderate LAE.  She had a normal Lexiscan Cardiolite in 07/2013, performed for the evaluation of fatigue and dyspnea.   Follow up echocardiogram on 07/26/14 demonstrated normal left ventricular systolic function, EF 95-63%, grade 2 diastolic dysfunction, two mild mitral regurgitant jets, mild tricuspid regurgitation with pulmonary pressures 46 mm mercury.  She denies orthopnea, dizziness, lightheadedness, and PND.  Takes Lasix prn for ankle swelling.  CT of the abdomen on 05/11/15 which showed no acute intra-abdominal processes and lower lung volumes.  Soc: Retired Marine scientist.  Review of Systems: As per "subjective", otherwise negative.  Allergies  Allergen Reactions  . Codeine Rash  . Demerol Rash    Current Outpatient Prescriptions  Medication Sig Dispense Refill  . bisoprolol-hydrochlorothiazide (ZIAC) 5-6.25 MG per tablet Take 1 tablet by mouth daily.    . Calcium Carbonate-Vit D-Min (CALCIUM 600+D PLUS MINERALS) 600-400 MG-UNIT TABS Take by mouth 2 (two) times daily.     . cyanocobalamin 500 MCG tablet Take 500 mcg by mouth daily.     . folic acid (FOLVITE) 1 MG tablet TAKE 1 TABLET BY MOUTH EVERY DAY 90 tablet 3  . furosemide (LASIX) 20 MG tablet Take 20 mg by mouth daily as needed.     Marland Kitchen omeprazole (PRILOSEC) 20 MG capsule Take 20 mg by mouth daily.    . potassium chloride (K-DUR,KLOR-CON) 10 MEQ tablet Take 10 mEq by mouth daily as needed.     . Probiotic Product (PROBIOTIC DAILY PO) Take by mouth daily.    . psyllium (METAMUCIL) 58.6 % packet Take 1 packet by mouth daily.     Marland Kitchen sulfaSALAzine (AZULFIDINE) 500 MG EC tablet Take 500 mg by mouth 3 (three) times daily.     . [DISCONTINUED] fluticasone (FLONASE) 50 MCG/ACT nasal spray Place 2 sprays into the nose daily.    . [DISCONTINUED] sucralfate (CARAFATE) 1 GM/10ML suspension Take 10 mLs (1 g total) by mouth 4 (four) times daily. 420 mL 1   No current facility-administered medications for this visit.    Past Medical History  Diagnosis Date  . UC (ulcerative colitis confined to rectum)   . GERD (gastroesophageal reflux disease)   . Hiatal hernia   . Proctitis   . Hypertension     Past Surgical History  Procedure Laterality Date  . Esophagogastroduodenoscopy  12/18/2011    Procedure: ESOPHAGOGASTRODUODENOSCOPY (EGD);  Surgeon: Rogene Houston, MD;  [facility];  Service: Endoscopy;  Laterality: N/A;  200  . Tonsillectomy    . Colonoscopy    . Dilation and curettage of uterus    . Colonoscopy  08/18/2012    Procedure: COLONOSCOPY;  Surgeon: Rogene Houston, MD;  [facility];  Service: Endoscopy;  Laterality: N/A;  1200    Social History   Social History  . Marital Status: Single    Spouse Name: N/A  . Number of Children: N/A  . Years of Education: N/A   Occupational History  . Not on file.   Social History Main Topics  . Smoking status: Never Smoker   . Smokeless tobacco: Never Used  .  Alcohol Use: No  . Drug Use: No  . Sexual Activity: Not on file   Other Topics Concern  . Not on file   Social History Narrative     Filed Vitals:   07/05/15 1406  BP: 132/82  Pulse: 74  Height: 5' 5"  (1.651 m)  Weight: 143 lb (64.864 kg)    PHYSICAL EXAM General: NAD  Neck: No JVD, no thyromegaly or thyroid nodule.  Lungs: Clear to auscultation bilaterally with normal respiratory effort.  CV: Nondisplaced PMI. Heart regular S1/S2, no G5/O0, I/VI holosystolic murmur at RUSB, and II/VI holosystolic murmur at cardiac apex, without radiation to axilla. Trace peripheral  edema. No carotid bruit.  Abdomen: Soft, nontender, no distention.  Skin: Intact without lesions or rashes.  Neurologic: Alert and oriented.  Psych: Normal affect.  Skin: Normal. Musculoskeletal: Normal range of motion, no gross deformities. Extremities: No clubbing or cyanosis.   ECG: Most recent ECG reviewed.      ASSESSMENT AND PLAN: 1. Mitral regurgitation: Two mild mitral regurgitant jets by echo on 07/26/14, with normal LV systolic function and size. Continue present therapy.  2. Tricuspid regurgitation/pulmonary hypertension: Mild regurgitation with stable pulmonary pressures of 46 mmHg. No signs of right heart failure. No changes to treatment plan.  3. Essential HTN: Well controlled on bisoprolol-HCTZ 5-6.25 mg daily. No changes.  4. GERD: Taking Priolosec 20 mg daily. No changes.  5. Ankle swelling: Takes Lasix prn.  Dispo: f/u prn.  Kate Sable, M.D., F.A.C.C.

## 2015-07-05 NOTE — Patient Instructions (Signed)
Your physician recommends that you schedule a follow-up appointment in: as needed   Thank you for choosing [facility] !

## 2015-07-26 ENCOUNTER — Ambulatory Visit: Payer: Medicare Other | Admitting: Cardiovascular Disease

## 2015-09-17 ENCOUNTER — Encounter (INDEPENDENT_AMBULATORY_CARE_PROVIDER_SITE_OTHER): Payer: Self-pay | Admitting: Internal Medicine

## 2015-09-17 ENCOUNTER — Ambulatory Visit (INDEPENDENT_AMBULATORY_CARE_PROVIDER_SITE_OTHER): Payer: Medicare Other | Admitting: Internal Medicine

## 2015-09-17 VITALS — BP 130/80 | HR 76 | Temp 97.9°F | Resp 18 | Ht 65.0 in | Wt 145.3 lb

## 2015-09-17 DIAGNOSIS — K5130 Ulcerative (chronic) rectosigmoiditis without complications: Secondary | ICD-10-CM

## 2015-09-17 NOTE — Progress Notes (Signed)
Presenting complaint;  Follow-up for ulcerative colitis.  Subjective:  Patient is 81 year old Caucasian female who has chronic ulcerative colitis and GERD and is here for scheduled visit. She was last seen on 05/08/2015. She states she noted mucus and smaller one of blood when she passed flatus about 3 weeks ago. She generally has 2 formed stools every morning. Renowned then she has third stool. She denies abdominal pain or cramping. She is not experiencing any side effects of sulfasalazine. She has occasional heartburn with certain foods. She takes Maalox or Mylanta with relief of heartburn. She has not taken OTC antacid in the last 30 days. Her appetite is good and her weight has been stable. Last colonoscopy was in October 2013.   Current Medications: Outpatient Encounter Prescriptions as of 09/17/2015  Medication Sig  . bisoprolol-hydrochlorothiazide (ZIAC) 5-6.25 MG per tablet Take 1 tablet by mouth daily.  . Calcium Carbonate-Vit D-Min (CALCIUM 600+D PLUS MINERALS) 600-400 MG-UNIT TABS Take by mouth 2 (two) times daily.   . cyanocobalamin 500 MCG tablet Take 500 mcg by mouth daily.   . folic acid (FOLVITE) 1 MG tablet TAKE 1 TABLET BY MOUTH EVERY DAY  . furosemide (LASIX) 20 MG tablet Take 20 mg by mouth daily as needed.   Marland Kitchen omeprazole (PRILOSEC) 20 MG capsule Take 20 mg by mouth daily.  . potassium chloride (K-DUR,KLOR-CON) 10 MEQ tablet Take 10 mEq by mouth daily as needed.   . Probiotic Product (PROBIOTIC DAILY PO) Take by mouth daily.  . psyllium (METAMUCIL) 58.6 % packet Take 1 packet by mouth daily.  Marland Kitchen sulfaSALAzine (AZULFIDINE) 500 MG EC tablet Take 500 mg by mouth 3 (three) times daily.    No facility-administered encounter medications on file as of 09/17/2015.     Objective: Blood pressure 130/80, pulse 76, temperature 97.9 F (36.6 C), temperature source Oral, resp. rate 18, height 5' 5"  (1.651 m), weight 145 lb 4.8 oz (65.908 kg). Patient is alert and in no acute  distress. She has mild hearing impairment. Conjunctiva is pink. Sclera is nonicteric Oropharyngeal mucosa is normal. No neck masses or thyromegaly noted. Cardiac exam with regular rhythm normal S1 and S2. He has grade 2/6 systolic ejection murmur which is best heard at aortic area. Lungs are clear to auscultation. Abdomen is symmetrical soft and nontender without organomegaly or masses. Trace edema noted around ankles.  Labs/studies Results: Lab data from 05/08/2015 WBC 4.3, H&H 12.6 and 39.1 and platelet count 273K  Assessment:  #1. Chronic ulcerative colitis. She appears to be in remission. Recent hematochezia possibly secondary to hemorrhoids. If she keeps having these spells will consider diagnostic flexible sigmoidoscopy. #2. GERD. Heartburns well controlled with therapy.   Plan:  Patient will call with progress report in one month. If she has another episode or two of hematochezia will consider flexible sigmoidoscopy. Unless she has problems she will return for office visit in one year. Will request copy of recent blood work for review.

## 2015-09-17 NOTE — Patient Instructions (Signed)
Please call office with progress report in 3-4 weeks.

## 2015-09-24 ENCOUNTER — Encounter (INDEPENDENT_AMBULATORY_CARE_PROVIDER_SITE_OTHER): Payer: Self-pay

## 2015-10-03 ENCOUNTER — Telehealth (INDEPENDENT_AMBULATORY_CARE_PROVIDER_SITE_OTHER): Payer: Self-pay | Admitting: *Deleted

## 2015-10-03 NOTE — Telephone Encounter (Signed)
A new prescription was called to [facility] for Omeprazole 20 mg - take 1 by mouth daily #90 with 3 refills. The patient was made aware.

## 2015-10-03 NOTE — Telephone Encounter (Signed)
Caroleen Stoermer calls in and states [facility] has her RX for Prilosec all messed up.  She only takes it 1 time a day and they have it as 2 times a day.  She used to take it 2 times but it was changed.  She would like for you to call them and get these fixed please,

## 2015-12-31 ENCOUNTER — Other Ambulatory Visit (INDEPENDENT_AMBULATORY_CARE_PROVIDER_SITE_OTHER): Payer: Self-pay | Admitting: Internal Medicine

## 2016-04-08 ENCOUNTER — Encounter (INDEPENDENT_AMBULATORY_CARE_PROVIDER_SITE_OTHER): Payer: Self-pay | Admitting: Internal Medicine

## 2016-04-08 ENCOUNTER — Other Ambulatory Visit (INDEPENDENT_AMBULATORY_CARE_PROVIDER_SITE_OTHER): Payer: Self-pay | Admitting: Internal Medicine

## 2016-04-08 ENCOUNTER — Ambulatory Visit (INDEPENDENT_AMBULATORY_CARE_PROVIDER_SITE_OTHER): Payer: Medicare Other | Admitting: Internal Medicine

## 2016-04-08 ENCOUNTER — Encounter (INDEPENDENT_AMBULATORY_CARE_PROVIDER_SITE_OTHER): Payer: Self-pay | Admitting: *Deleted

## 2016-04-08 VITALS — BP 144/78 | HR 62 | Temp 98.0°F | Ht 65.0 in | Wt 145.5 lb

## 2016-04-08 DIAGNOSIS — K51211 Ulcerative (chronic) proctitis with rectal bleeding: Secondary | ICD-10-CM

## 2016-04-08 DIAGNOSIS — K5120 Ulcerative (chronic) proctitis without complications: Secondary | ICD-10-CM

## 2016-04-08 MED ORDER — MESALAMINE 1000 MG RE SUPP: 1000.0000 mg | Freq: Every day | RECTAL | Status: DC

## 2016-04-08 NOTE — Patient Instructions (Signed)
Sigmoid. The risks and benefits such as perforation, bleeding, and infection were reviewed with the patient and is agreeable.

## 2016-04-08 NOTE — Progress Notes (Signed)
Subjective:    Patient ID: Autumn [facility], female    DOB: 1933/01/28, 82 y.o.   MRN: [id number]  HPI Hx of UC. She tells me she has been having lower abdominal pain. She says she feels bloated. She has a lot of flatus. She tells me her stools are formed. She has seen blood and mucous with her stools. Her stools are formed. She has urgency.  She has a small amt of pain today.  Appetite is good. No weight loss.   She was last seen in November, 2016 by Dr. Laural Golden.   Procedure: Colonoscopy 08/18/2012 Indications: Patient is 82 year old Caucasian female with history of chronic ulcerative colitis whose last exam was over 4 years ago was noted to have heme positive stools. She is having normal bowel movements. She is undergoing diagnostic/surveillance colonoscopy.  Impression:  Examination performed to cecum. Active colitis involving distal sigmoid colon and rectum (multiple erosions and friable mucosa). Biopsies taken. No evidence of polyps or mass.  Review of Systems Past Medical History  Diagnosis Date  . UC (ulcerative colitis confined to rectum) ([place])   . GERD (gastroesophageal reflux disease)   . Hiatal hernia   . Proctitis   . Hypertension     Past Surgical History  Procedure Laterality Date  . Esophagogastroduodenoscopy  12/18/2011    Procedure: ESOPHAGOGASTRODUODENOSCOPY (EGD);  Surgeon: Rogene Houston, MD;  Location: [facility];  Service: Endoscopy;  Laterality: N/A;  200  . Tonsillectomy    . Colonoscopy    . Dilation and curettage of uterus    . Colonoscopy  08/18/2012    Procedure: COLONOSCOPY;  Surgeon: Rogene Houston, MD;  Location: [facility];  Service: Endoscopy;  Laterality: N/A;  1200    Allergies  Allergen Reactions  . Codeine Rash  . Demerol Rash    Current Outpatient Prescriptions on File Prior to Visit  Medication Sig Dispense Refill  . bisoprolol-hydrochlorothiazide (ZIAC) 5-6.25 MG per tablet Take 1 tablet by mouth daily.    .  Calcium Carbonate-Vit D-Min (CALCIUM 600+D PLUS MINERALS) 600-400 MG-UNIT TABS Take by mouth 2 (two) times daily.     . cyanocobalamin 500 MCG tablet Take 500 mcg by mouth daily.     . folic acid (FOLVITE) 1 MG tablet TAKE 1 TABLET BY MOUTH EVERY DAY 90 tablet 3  . furosemide (LASIX) 20 MG tablet Take 20 mg by mouth daily as needed.     Marland Kitchen omeprazole (PRILOSEC) 20 MG capsule Take 20 mg by mouth daily.    . potassium chloride (K-DUR,KLOR-CON) 10 MEQ tablet Take 10 mEq by mouth daily as needed.     . Probiotic Product (PROBIOTIC DAILY PO) Take by mouth daily.    . psyllium (METAMUCIL) 58.6 % packet Take 1 packet by mouth daily.    Marland Kitchen sulfaSALAzine (AZULFIDINE) 500 MG EC tablet Take 500 mg by mouth 3 (three) times daily.     Marland Kitchen sulfaSALAzine (AZULFIDINE) 500 MG tablet TAKE 1 TABLET BY MOUTH THREE TIMES DAILY 270 tablet 3  . [DISCONTINUED] fluticasone (FLONASE) 50 MCG/ACT nasal spray Place 2 sprays into the nose daily.    . [DISCONTINUED] sucralfate (CARAFATE) 1 GM/10ML suspension Take 10 mLs (1 g total) by mouth 4 (four) times daily. 420 mL 1   No current facility-administered medications on file prior to visit.        Objective:   Physical Exam Blood pressure 144/78, pulse 62, temperature 98 F (36.7 C), height 5' 5"  (1.651 m), weight 145  lb 8 oz (65.998 kg). Alert and oriented. Skin warm and dry. Oral mucosa is moist.   . Sclera anicteric, conjunctivae is pink. Thyroid not enlarged. No cervical lymphadenopathy. Lungs clear. Heart regular rate and rhythm.  Abdomen is soft. Bowel sounds are positive. No hepatomegaly. No abdominal masses felt. No tenderness.  No edema to lower extremities.  No stool and guaiac negative.    .  . Lot [id number] . Ex 9/17    Assessment & Plan:  Possible UC flare. Sigmoid.The risks and benefits such as perforation, bleeding, and infection were reviewed with the patient and is agreeable. CBC, and sedrate today.  Rx for Canasa Supp sent to her pharmacy x 30 days.

## 2016-04-09 ENCOUNTER — Telehealth (INDEPENDENT_AMBULATORY_CARE_PROVIDER_SITE_OTHER): Payer: Self-pay | Admitting: Internal Medicine

## 2016-04-09 ENCOUNTER — Ambulatory Visit (INDEPENDENT_AMBULATORY_CARE_PROVIDER_SITE_OTHER): Payer: Medicare Other | Admitting: Internal Medicine

## 2016-04-09 LAB — CBC WITH DIFFERENTIAL/PLATELET
Basophils Absolute: 0 cells/uL (ref 0–200)
Basophils Relative: 0 %
Eosinophils Absolute: 0 cells/uL — ABNORMAL LOW (ref 15–500)
Eosinophils Relative: 0 %
HCT: 37.1 % (ref 35.0–45.0)
Hemoglobin: 12.3 g/dL (ref 11.7–15.5)
Lymphocytes Relative: 31 %
Lymphs Abs: 1426 cells/uL (ref 850–3900)
MCH: 29.1 pg (ref 27.0–33.0)
MCHC: 33.2 g/dL (ref 32.0–36.0)
MCV: 87.9 fL (ref 80.0–100.0)
MPV: 10.9 fL (ref 7.5–12.5)
Monocytes Absolute: 414 cells/uL (ref 200–950)
Monocytes Relative: 9 %
Neutro Abs: 2760 cells/uL (ref 1500–7800)
Neutrophils Relative %: 60 %
Platelets: 268 10*3/uL (ref 140–400)
RBC: 4.22 MIL/uL (ref 3.80–5.10)
RDW: 13 % (ref 11.0–15.0)
WBC: 4.6 10*3/uL (ref 3.8–10.8)

## 2016-04-09 LAB — SEDIMENTATION RATE: Sed Rate: 7 mm/hr (ref 0–30)

## 2016-04-09 MED ORDER — HYDROCORTISONE 100 MG/60ML RE ENEM: 1.0000 | ENEMA | Freq: Every day | RECTAL | Status: DC

## 2016-04-09 NOTE — Telephone Encounter (Signed)
Rx sent to her lab

## 2016-04-09 NOTE — Telephone Encounter (Signed)
[facility] w/[facility] called and needs clarification on the medication quantity.  [phone]

## 2016-04-10 NOTE — Telephone Encounter (Signed)
addressed

## 2016-04-15 ENCOUNTER — Encounter (INDEPENDENT_AMBULATORY_CARE_PROVIDER_SITE_OTHER): Payer: Self-pay | Admitting: Internal Medicine

## 2016-04-30 ENCOUNTER — Telehealth (INDEPENDENT_AMBULATORY_CARE_PROVIDER_SITE_OTHER): Payer: Self-pay | Admitting: *Deleted

## 2016-04-30 NOTE — Telephone Encounter (Signed)
Patient left message to cancel her Flex Sig sch;d 05/22/16, she states medicine is working and she doesn't need it

## 2016-05-22 ENCOUNTER — Encounter (HOSPITAL_COMMUNITY): Admission: RE | Payer: Self-pay | Source: Ambulatory Visit

## 2016-05-22 ENCOUNTER — Ambulatory Visit (HOSPITAL_COMMUNITY): Admission: RE | Admit: 2016-05-22 | Payer: Medicare Other | Source: Ambulatory Visit | Admitting: Internal Medicine

## 2016-05-22 SURGERY — SIGMOIDOSCOPY, FLEXIBLE
Anesthesia: Moderate Sedation

## 2016-06-12 ENCOUNTER — Encounter (INDEPENDENT_AMBULATORY_CARE_PROVIDER_SITE_OTHER): Payer: Self-pay | Admitting: Internal Medicine

## 2016-06-15 ENCOUNTER — Encounter (INDEPENDENT_AMBULATORY_CARE_PROVIDER_SITE_OTHER): Payer: Self-pay | Admitting: Internal Medicine

## 2016-06-16 ENCOUNTER — Telehealth (INDEPENDENT_AMBULATORY_CARE_PROVIDER_SITE_OTHER): Payer: Self-pay | Admitting: *Deleted

## 2016-06-16 NOTE — Telephone Encounter (Signed)
This is from a patient advice on 06/15/2016.  Autumn Dean,  I decided to take the suppositories that Terri prescribed about one month ago . My Colitis appeared to have cleared up , but I have had a UTI , like I had when I used them before. This past week it has come back so I am using the Asulfidine I more gram . Today I have been nauseated , which is not new and now I bloated like before. Bloating and abdominal pain is something I have not had in the past , having allot of mucus and some blood . I wonder if I need a stool culture or what , I am not having loose stools . I guess I need to get to the end of , instead of thinking it will get better .  Thanks Teresa Pelton    Forwarded to [place] for review, patient will be contacted with his recommendation.

## 2016-06-27 ENCOUNTER — Other Ambulatory Visit (INDEPENDENT_AMBULATORY_CARE_PROVIDER_SITE_OTHER): Payer: Self-pay | Admitting: Internal Medicine

## 2016-06-28 ENCOUNTER — Encounter (INDEPENDENT_AMBULATORY_CARE_PROVIDER_SITE_OTHER): Payer: Self-pay | Admitting: Internal Medicine

## 2016-08-07 NOTE — Telephone Encounter (Signed)
Already addressed

## 2016-09-30 ENCOUNTER — Encounter (INDEPENDENT_AMBULATORY_CARE_PROVIDER_SITE_OTHER): Payer: Self-pay | Admitting: Internal Medicine

## 2016-09-30 ENCOUNTER — Ambulatory Visit (INDEPENDENT_AMBULATORY_CARE_PROVIDER_SITE_OTHER): Payer: Medicare Other | Admitting: Internal Medicine

## 2016-09-30 VITALS — BP 122/72 | HR 64 | Temp 98.3°F | Resp 18 | Ht 65.0 in | Wt 142.2 lb

## 2016-09-30 DIAGNOSIS — R1310 Dysphagia, unspecified: Secondary | ICD-10-CM | POA: Diagnosis not present

## 2016-09-30 DIAGNOSIS — K2100 Gastro-esophageal reflux disease with esophagitis, without bleeding: Secondary | ICD-10-CM

## 2016-09-30 DIAGNOSIS — K5130 Ulcerative (chronic) rectosigmoiditis without complications: Secondary | ICD-10-CM

## 2016-09-30 DIAGNOSIS — R1319 Other dysphagia: Secondary | ICD-10-CM

## 2016-09-30 MED ORDER — PANTOPRAZOLE SODIUM 40 MG PO TBEC: 40.0000 mg | DELAYED_RELEASE_TABLET | Freq: Every day | ORAL | 5 refills | Status: DC

## 2016-09-30 NOTE — Patient Instructions (Signed)
Will request copy of recent blood work from Dr.Vyas office. Barium pill esophagogram to be scheduled.

## 2016-09-30 NOTE — Progress Notes (Signed)
Presenting complaint;  Follow-up for ulcerative colitis. Patient complains of heartburn and dysphagia.  Subjective:  Autumn Dean is a 82 year old Caucasian female who is here for scheduled visit. She was last seen in the office on 04/08/2016. She was given prescription for Canasa suppositories for rectal symptoms/flare. She was also given prescription for hydrocortisone enema. After using it for the first time she developed itching. She was seen in emergency room and treated with steroid and Vistaril and slept the next day. She was seen by Dr. Woody Seller and she was also complaining of epigastric pain heartburn and dysphagia. She was given prescription for famotidine. She slept all day after taking this medication she therefore stopped it. She says omeprazole is not working anymore. She has discomfort in epigastric region. She believes food lodges at the junction of the esophagus and stomach. It eventually passes down. She also has intermittent regurgitation. She is having 1-2 formed stools daily. Occasionally she may have third stool. She has not seen any blood per rectum. However she feels she may be passing blood because of odor. She has not had tarry stools. Her appetite is fair. She denies nausea or vomiting. She has lost 3 pounds since her last visit.    Current Medications: Outpatient Encounter Prescriptions as of 09/30/2016  Medication Sig  . bisoprolol-hydrochlorothiazide (ZIAC) 5-6.25 MG per tablet Take 1 tablet by mouth daily.  . Calcium Carbonate-Vit D-Min (CALCIUM 600+D PLUS MINERALS) 600-400 MG-UNIT TABS Take by mouth 2 (two) times daily.   . cyanocobalamin 500 MCG tablet Take 1,000 mcg by mouth daily.   . folic acid (FOLVITE) 1 MG tablet TAKE 1 TABLET BY MOUTH EVERY DAY  . omeprazole (PRILOSEC) 20 MG capsule TAKE 1 CAPSULE BY MOUTH EVERY DAY  . Probiotic Product (PROBIOTIC DAILY PO) Take by mouth daily.  . psyllium (METAMUCIL) 58.6 % packet Take 1 packet by mouth daily.  Marland Kitchen sulfaSALAzine  (AZULFIDINE) 500 MG tablet TAKE 1 TABLET BY MOUTH THREE TIMES DAILY  . [DISCONTINUED] furosemide (LASIX) 20 MG tablet Take 20 mg by mouth daily as needed.   . [DISCONTINUED] hydrocortisone (CORTENEMA) 100 MG/60ML enema Place 1 enema (100 mg total) rectally at bedtime. (Patient not taking: Reported on 09/30/2016)  . [DISCONTINUED] mesalamine (CANASA) 1000 MG suppository Place 1 suppository (1,000 mg total) rectally at bedtime. (Patient not taking: Reported on 09/30/2016)  . [DISCONTINUED] sulfaSALAzine (AZULFIDINE) 500 MG EC tablet Take 500 mg by mouth 3 (three) times daily.    No facility-administered encounter medications on file as of 09/30/2016.      Objective: Blood pressure 122/72, pulse 64, temperature 98.3 F (36.8 C), temperature source Oral, resp. rate 18, height 5' 5"  (1.651 m), weight 142 lb 3.2 oz (64.5 kg). Patient is alert and in no acute distress. She has hearing impairment. Conjunctiva is pink. Sclera is nonicteric Oropharyngeal mucosa is normal. No neck masses or thyromegaly noted. Cardiac exam with regular rhythm normal S1 and S2. She has grade 2/6 systolic ejection murmur best heard at left sternal border. Lungs are clear to auscultation. Abdomen is symmetrical and soft with mild midepigastric tenderness. No organomegaly or masses noted. No LE edema or clubbing noted.  Labs/studies Results: Lab data from 04/08/2016  Sedimentation rate 7  WBC 4.6, H&H 12.3 and 37.1 and platelet count 268K.    Assessment:  #1. Distal ulcerative colitis appears to be in remission. He was treated with hydrocortisone enemas to months ago and developed itching. Therefore he would not be able to use his prep in future. Allergy  must be secondary to agents other than the cortisone. She is presently on sulfasalazine for maintenance. #2.  GERD. She has history of erosive reflux esophagitis. Omeprazole has stopped working and she developed side effects with famotidine in the form of somnolence.  Therefore will try her on other PPI. #3. Solid food dysphagia. She may have esophageal stricture and or ring.   Plan:  Pantoprazole 40 mg by mouth every morning. Barium pill esophagogram. Will requests copy of recent blood work from Dr. Woody Seller office. Office visit in 6 months.

## 2016-10-06 ENCOUNTER — Ambulatory Visit (HOSPITAL_COMMUNITY)
Admission: RE | Admit: 2016-10-06 | Discharge: 2016-10-06 | Disposition: A | Discharge status: Home / Self Care | Payer: Medicare Other | Source: Ambulatory Visit | Attending: Internal Medicine | Admitting: Internal Medicine

## 2016-10-06 DIAGNOSIS — R1319 Other dysphagia: Secondary | ICD-10-CM

## 2016-10-06 DIAGNOSIS — R1310 Dysphagia, unspecified: Secondary | ICD-10-CM | POA: Diagnosis not present

## 2016-10-06 DIAGNOSIS — K224 Dyskinesia of esophagus: Secondary | ICD-10-CM | POA: Insufficient documentation

## 2016-10-14 ENCOUNTER — Encounter (INDEPENDENT_AMBULATORY_CARE_PROVIDER_SITE_OTHER): Payer: Self-pay | Admitting: Internal Medicine

## 2016-12-30 ENCOUNTER — Other Ambulatory Visit (INDEPENDENT_AMBULATORY_CARE_PROVIDER_SITE_OTHER): Payer: Self-pay | Admitting: Internal Medicine

## 2017-03-31 ENCOUNTER — Ambulatory Visit (INDEPENDENT_AMBULATORY_CARE_PROVIDER_SITE_OTHER): Payer: Medicare Other | Admitting: Internal Medicine

## 2017-04-07 ENCOUNTER — Ambulatory Visit (INDEPENDENT_AMBULATORY_CARE_PROVIDER_SITE_OTHER): Payer: Medicare Other | Admitting: Internal Medicine

## 2017-04-07 ENCOUNTER — Encounter (INDEPENDENT_AMBULATORY_CARE_PROVIDER_SITE_OTHER): Payer: Self-pay | Admitting: Internal Medicine

## 2017-04-07 VITALS — BP 128/72 | HR 64 | Temp 98.2°F | Resp 18 | Ht 65.0 in | Wt 148.7 lb

## 2017-04-07 DIAGNOSIS — K5130 Ulcerative (chronic) rectosigmoiditis without complications: Secondary | ICD-10-CM | POA: Diagnosis not present

## 2017-04-07 DIAGNOSIS — K2100 Gastro-esophageal reflux disease with esophagitis, without bleeding: Secondary | ICD-10-CM

## 2017-04-07 NOTE — Patient Instructions (Signed)
Will request copy of blood work from Dr. Marcial Pacas office for review

## 2017-04-07 NOTE — Progress Notes (Signed)
Presenting complaint;  Follow-up for UC and GERD.  Subjective:  Autumn Dean is 83 year old Caucasian female who is here for scheduled visit. She was last seen on 09/30/2016. She was switched from omeprazole to pantoprazole as she stated omeprazole was not working. After a few days she decided to go back on omeprazole. She states omeprazole is working much better than pantoprazole did. She is having 1-3 formed stools daily. She denies melena or rectal bleeding. She is having intermittent pelvic pain. She believes it is gynecologic. She is planning to see her gynecologist. She has good appetite. She has gained 6 pounds since her last visit. She had blood work by Dr. Woody Seller few months ago. She says everything was normal.  Current Medications: Outpatient Encounter Prescriptions as of 04/07/2017  Medication Sig  . AZO-CRANBERRY PO Take by mouth 2 (two) times daily.  . bisoprolol-hydrochlorothiazide (ZIAC) 5-6.25 MG per tablet Take 1 tablet by mouth daily.  . Calcium Carbonate-Vit D-Min (CALCIUM 600+D PLUS MINERALS) 600-400 MG-UNIT TABS Take by mouth 2 (two) times daily.   . cyanocobalamin 500 MCG tablet Take 1,000 mcg by mouth daily.   . folic acid (FOLVITE) 1 MG tablet TAKE 1 TABLET BY MOUTH EVERY DAY  . omeprazole (PRILOSEC) 20 MG capsule Take 20 mg by mouth daily.  . Probiotic Product (PROBIOTIC DAILY PO) Take by mouth daily.  . psyllium (METAMUCIL) 58.6 % packet Take 1 packet by mouth daily.  Marland Kitchen sulfaSALAzine (AZULFIDINE) 500 MG tablet TAKE 1 TABLET BY MOUTH THREE TIMES DAILY  . [DISCONTINUED] pantoprazole (PROTONIX) 40 MG tablet Take 1 tablet (40 mg total) by mouth daily. (Patient not taking: Reported on 04/07/2017)   No facility-administered encounter medications on file as of 04/07/2017.      Objective: Blood pressure 128/72, pulse 64, temperature 98.2 F (36.8 C), temperature source Oral, resp. rate 18, height 5' 5"  (1.651 m), weight 148 lb 11.2 oz (67.4 kg). Patient is alert and in no acute  distress. She has sating impairments. Conjunctiva is pink. Sclera is nonicteric Oropharyngeal mucosa is normal. No neck masses or thyromegaly noted. Cardiac exam with regular rhythm normal S1 and S2. No murmur or gallop noted. Lungs are clear to auscultation. Abdomen is symmetrical soft and nontender without organomegaly or masses. No LE edema or clubbing noted.   Assessment:  #1. Chronic ulcerative colitis. She remains in remission while on sulfasalazine along with folic acid. Last colonoscopy was in October 2013. #2. Chronic GERD. She is back on omeprazole and appears to be doing well.   Plan:  Will request copy of recent blood work from Dr. Marcial Pacas office. Continue sulfasalazine folic acid and omeprazole at current dose. Unless she has problems she will return for office visit in one year.

## 2017-04-13 ENCOUNTER — Encounter (INDEPENDENT_AMBULATORY_CARE_PROVIDER_SITE_OTHER): Payer: Self-pay

## 2017-04-16 ENCOUNTER — Encounter (INDEPENDENT_AMBULATORY_CARE_PROVIDER_SITE_OTHER): Payer: Self-pay | Admitting: Orthopaedic Surgery

## 2017-04-16 ENCOUNTER — Ambulatory Visit (INDEPENDENT_AMBULATORY_CARE_PROVIDER_SITE_OTHER): Payer: Self-pay

## 2017-04-16 ENCOUNTER — Ambulatory Visit (INDEPENDENT_AMBULATORY_CARE_PROVIDER_SITE_OTHER): Payer: Medicare Other | Admitting: Orthopaedic Surgery

## 2017-04-16 VITALS — BP 161/78 | HR 70 | Ht 65.0 in | Wt 148.0 lb

## 2017-04-16 DIAGNOSIS — M25551 Pain in right hip: Secondary | ICD-10-CM

## 2017-04-16 DIAGNOSIS — M545 Low back pain, unspecified: Secondary | ICD-10-CM | POA: Insufficient documentation

## 2017-04-16 NOTE — Progress Notes (Signed)
Office Visit Note   Patient: Autumn Dean           Date of Birth: [birth year]           MRN: [id number] Visit Date: 04/16/2017              Requested by: Glenda Chroman, MD [place], [place] [address] PCP: Glenda Chroman, MD   Assessment & Plan: Visit Diagnoses:  1. Acute right-sided low back pain, with sciatica presence unspecified   2. Pain in right hip     Plan: Patient is to use her knee high teds to help with the pedal edema. We discussed a walking program for her. We reviewed x-rays of the pelvis which show normal hips. She has considerable facet arthropathy in the lumbar spine with mild curvature from disc degeneration. No evidence of compression fractures and no spondylolisthesis. She likely has some lateral recess narrowing on the right side consistent with her sciatic symptoms. Her symptoms are not severe enough to consider MRI at this point. Her symptoms get worse she will call and we can proceed with lumbar imaging. Currently she work on a walking program we discussed fall prevention.  Follow-Up Instructions: Return if symptoms worsen or fail to improve.   Orders:  Orders Placed This Encounter  Procedures  . XR Pelvis 1-2 Views  . XR Lumbar Spine 2-3 Views   No orders of the defined types were placed in this encounter.     Procedures: No procedures performed   Clinical Data: No additional findings.   Subjective: Chief Complaint  Patient presents with  . Lower Back - Pain  . Right Hip - Pain    HPI 82 year old female seen with low back pain right buttocks pain and some numbness that radiates down her right foot. She has chronic edema lower extremities had previous biopsy on her leg with eschar. She has some numbness in her right foot none on the left side. She's used exercise Tylenol, heat, previous bursa injection in the trochanter one year ago with some temporary relief. She does have ulcerative colitis but no associated bowel or bladder symptoms  related to her back pain. Denies fever or chills. No falling but she states she is careful with on level ground and stairs.  Review of Systems 14 part review of systems positive for GERD on medication, ulcerative colitis she takes Azulfidine for this. Positive for mitral regurg, hypertension, tricuspid regurg. Negative for heart failure symptoms currently. She denies claudication symptoms but states she does tire easily from her heart. Otherwise negative as it pertains to her history of present illness   Objective: Vital Signs: BP (!) 161/78   Pulse 70   Ht 5' 5"  (1.651 m)   Wt 148 lb (67.1 kg)   BMI 24.63 kg/m   Physical Exam  Constitutional: She is oriented to person, place, and time. She appears well-developed.  HENT:  Head: Normocephalic.  Right Ear: External ear normal.  Left Ear: External ear normal.  Eyes: Pupils are equal, round, and reactive to light.  Neck: No tracheal deviation present. No thyromegaly present.  Cardiovascular: Normal rate.   Pulmonary/Chest: Effort normal.  Abdominal: Soft.  Musculoskeletal:  Patient's pelvis is level she has some mild lumbar scoliosis. No pain with hip range of motion is reach full extension negative popliteal compression test. She does have sciatic notch tenderness on the right side. Anterior tib EHL is strong. Distal pulses are intact. She has pedal edema from her  knees to her ankles. Her shoes have blocked edema. Distal pulses are palpable. Skin over the pretibial areas shiny with 4+ pitting edema. Negative Faber test.  Neurological: She is alert and oriented to person, place, and time.  Skin: Skin is warm and dry.  Psychiatric: She has a normal mood and affect. Her behavior is normal.    Ortho Exam  Specialty Comments:  No specialty comments available.  Imaging: Xr Lumbar Spine 2-3 Views  Result Date: 04/16/2017 [facility] lateral lumbar x-rays obtained and reviewed. This shows significant facet arthropathy with some scoliosis, mild  with the significant degenerative facet changes bottom 3 lumbar levels. Grade 1 anterolisthesis L4-5. Impression: Lumbar degenerative disc changes with facet arthropathy. Slight calcification of the abdominal aorta  Xr Pelvis 1-2 Views  Result Date: 04/16/2017 Pelvis x-rays are obtained. This shows a likely calcified fibroid. No evidence of pelvic or hip fracture. No significant hip osteoarthritis. Impression: Normal pelvis and hips, likely calcified fibroid of the uterus    PMFS History: Patient Active Problem List   Diagnosis Date Noted  . Acute right-sided low back pain 04/16/2017  . Tricuspid regurgitation 08/01/2014  . Mitral regurgitation 07/08/2013  . GERD (gastroesophageal reflux disease) 12/04/2011  . UC (ulcerative colitis confined to rectum) ([place]) 12/04/2011  . Hypertension 12/04/2011   Past Medical History:  Diagnosis Date  . GERD (gastroesophageal reflux disease)   . Hiatal hernia   . Hypertension   . Proctitis   . UC (ulcerative colitis confined to rectum) [facility])     Family History  Problem Relation Age of Onset  . Colon cancer Neg Hx     Past Surgical History:  Procedure Laterality Date  . COLONOSCOPY    . COLONOSCOPY  08/18/2012   Procedure: COLONOSCOPY;  Surgeon: Rogene Houston, MD;  Location: [facility];  Service: Endoscopy;  Laterality: N/A;  1200  . DILATION AND CURETTAGE OF UTERUS    . ESOPHAGOGASTRODUODENOSCOPY  12/18/2011   Procedure: ESOPHAGOGASTRODUODENOSCOPY (EGD);  Surgeon: Rogene Houston, MD;  Location: [facility];  Service: Endoscopy;  Laterality: N/A;  200  . TONSILLECTOMY     Social History   Occupational History  . Not on file.   Social History Main Topics  . Smoking status: Never Smoker  . Smokeless tobacco: Never Used  . Alcohol use No  . Drug use: No  . Sexual activity: Not on file

## 2017-05-21 NOTE — Addendum Note (Signed)
Addended by: Marybelle Killings on: 05/21/2017 03:43 PM   Modules accepted: Level of Service

## 2017-06-22 ENCOUNTER — Other Ambulatory Visit (INDEPENDENT_AMBULATORY_CARE_PROVIDER_SITE_OTHER): Payer: Self-pay | Admitting: Internal Medicine

## 2017-11-28 ENCOUNTER — Other Ambulatory Visit (INDEPENDENT_AMBULATORY_CARE_PROVIDER_SITE_OTHER): Payer: Self-pay | Admitting: Internal Medicine

## 2017-11-30 ENCOUNTER — Telehealth (INDEPENDENT_AMBULATORY_CARE_PROVIDER_SITE_OTHER): Payer: Self-pay | Admitting: *Deleted

## 2017-11-30 NOTE — Telephone Encounter (Signed)
Patient called stating she is having trouble with her hernia, patient is taking prilosec and it is not helping and she is having a lot of gas. Patient states she feels gas in her throat and is having a burning pain. Patient is wanting to know what does she need to do. Please advise  [phone]

## 2017-12-01 ENCOUNTER — Other Ambulatory Visit (INDEPENDENT_AMBULATORY_CARE_PROVIDER_SITE_OTHER): Payer: Self-pay | Admitting: *Deleted

## 2017-12-01 NOTE — Telephone Encounter (Signed)
Patient has called back for [name] left a message stating she did not understand the message that was left, patient states she thinks it may have said something about gas. Patient states she uses [facility].

## 2017-12-02 ENCOUNTER — Other Ambulatory Visit (INDEPENDENT_AMBULATORY_CARE_PROVIDER_SITE_OTHER): Payer: Self-pay | Admitting: *Deleted

## 2017-12-02 DIAGNOSIS — R141 Gas pain: Secondary | ICD-10-CM

## 2017-12-02 DIAGNOSIS — R142 Eructation: Secondary | ICD-10-CM

## 2017-12-02 DIAGNOSIS — R143 Flatulence: Secondary | ICD-10-CM

## 2017-12-02 DIAGNOSIS — K219 Gastro-esophageal reflux disease without esophagitis: Secondary | ICD-10-CM

## 2017-12-02 MED ORDER — PANTOPRAZOLE SODIUM 40 MG PO TBEC: 40.0000 mg | DELAYED_RELEASE_TABLET | Freq: Every day | ORAL | 3 refills | Status: DC

## 2017-12-02 MED ORDER — SIMETHICONE 180 MG PO CAPS: 180.0000 mg | ORAL_CAPSULE | Freq: Four times a day (QID) | ORAL | 2 refills | Status: DC | PRN

## 2017-12-02 NOTE — Telephone Encounter (Signed)
Per Dr.Rehman - the patient may take the Protonix (pantoprazole ) 40 mg daily #90 with 3 refills and she may also take Phazyme 180 mg every 6 hours as needed for flatulence.  This was e-scribed to the patient 's pharmacy [facility].

## 2018-01-06 ENCOUNTER — Telehealth (INDEPENDENT_AMBULATORY_CARE_PROVIDER_SITE_OTHER): Payer: Self-pay | Admitting: *Deleted

## 2018-01-06 ENCOUNTER — Other Ambulatory Visit (INDEPENDENT_AMBULATORY_CARE_PROVIDER_SITE_OTHER): Payer: Self-pay | Admitting: *Deleted

## 2018-01-06 MED ORDER — OMEPRAZOLE 20 MG PO CPDR: 20.0000 mg | DELAYED_RELEASE_CAPSULE | Freq: Every day | ORAL | 3 refills | Status: DC

## 2018-01-06 NOTE — Telephone Encounter (Signed)
A order for the Prilosec was sent to the patient's pharmacy , [facility]. Patient was made aware.

## 2018-01-06 NOTE — Telephone Encounter (Signed)
Patient has called [facility] and she has had problems taking the medication that Dr [facility] switched her too, patient stated it wasn't doing anything and she switched back to prilosec. Patient needs a refill on prilosec. Please advise

## 2018-04-13 ENCOUNTER — Ambulatory Visit (INDEPENDENT_AMBULATORY_CARE_PROVIDER_SITE_OTHER): Payer: Medicare Other | Admitting: Internal Medicine

## 2018-04-14 ENCOUNTER — Encounter (INDEPENDENT_AMBULATORY_CARE_PROVIDER_SITE_OTHER): Payer: Self-pay | Admitting: Internal Medicine

## 2018-04-14 ENCOUNTER — Ambulatory Visit (INDEPENDENT_AMBULATORY_CARE_PROVIDER_SITE_OTHER): Payer: Medicare Other | Admitting: Internal Medicine

## 2018-04-14 VITALS — BP 130/68 | HR 63 | Temp 97.0°F | Resp 18 | Ht 65.0 in | Wt 140.4 lb

## 2018-04-14 DIAGNOSIS — K2100 Gastro-esophageal reflux disease with esophagitis, without bleeding: Secondary | ICD-10-CM

## 2018-04-14 DIAGNOSIS — K5130 Ulcerative (chronic) rectosigmoiditis without complications: Secondary | ICD-10-CM | POA: Diagnosis not present

## 2018-04-14 DIAGNOSIS — R195 Other fecal abnormalities: Secondary | ICD-10-CM

## 2018-04-14 NOTE — Progress Notes (Signed)
Presenting complaint;  History of ulcerative colitis and GERD. Yearly visit.  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  She was last seen on 04/07/2017.  Patient brings along a copy of Hemoccult report which was reported to be positive on 03/08/2018.  She denies melena or rectal bleeding.  She generally has 1 large formed stool every morning and occasionally a second 1.  She has sporadic upper and lower abdominal pain which she describes as gas pain and may last for couple hours but never intense or associated with with vomiting diarrhea urgency.  She may have nausea occasionally..  She is trying to be more active physically.  She has lost 8 pounds since her last visit.  She says her weight has not changed over the last 2 months.  She would be due for yearly blood work in 2 months. Her last colonoscopy was in October 2013 revealing active disease to distal sigmoid colon and rectum.  Biopsy from rectosigmoid region showed mildly active colitis. Her last colonoscopy was she states PPI is working.  In the past she tried to use Zantac but it caused drowsiness.  She is willing to try another H2 B.  Current Medications: Outpatient Encounter Medications as of 04/14/2018  Medication Sig  . AZO-CRANBERRY PO Take 2 tablets by mouth daily.   . bisoprolol-hydrochlorothiazide (ZIAC) 5-6.25 MG per tablet Take 1 tablet by mouth daily.  . Calcium Carbonate-Vit D-Min (CALCIUM 600+D PLUS MINERALS) 600-400 MG-UNIT TABS Take by mouth 2 (two) times daily.   . cyanocobalamin 500 MCG tablet Take 1,000 mcg by mouth daily.   . folic acid (FOLVITE) 1 MG tablet TAKE 1 TABLET BY MOUTH EVERY DAY  . omeprazole (PRILOSEC) 20 MG capsule Take 1 capsule (20 mg total) by mouth daily.  . psyllium (METAMUCIL) 58.6 % packet Take 1 packet by mouth daily.  . Simethicone (PHAZYME) 180 MG CAPS Take 1 capsule (180 mg total) by mouth every 6 (six) hours as needed.  .  sulfaSALAzine (AZULFIDINE) 500 MG tablet TAKE 1 TABLET BY MOUTH THREE TIMES DAILY  . [DISCONTINUED] fluticasone (FLONASE) 50 MCG/ACT nasal spray Place 2 sprays into the nose daily.  . [DISCONTINUED] pantoprazole (PROTONIX) 40 MG tablet Take 1 tablet (40 mg total) by mouth daily. (Patient not taking: Reported on 04/14/2018)  . [DISCONTINUED] Probiotic Product (PROBIOTIC DAILY PO) Take by mouth daily.  . [DISCONTINUED] sucralfate (CARAFATE) 1 GM/10ML suspension Take 10 mLs (1 g total) by mouth 4 (four) times daily.   No facility-administered encounter medications on file as of 04/14/2018.    Past Medical History:  Diagnosis Date  . GERD (gastroesophageal reflux disease)   . Hiatal hernia   . Hypertension   .    . UC (ulcerative colitis confined to distal sigmoid colon and rectum) [facility])    Past Surgical History:  Procedure Laterality Date  .     Marland Kitchen COLONOSCOPY  08/18/2012   Procedure: COLONOSCOPY;  Surgeon: Rogene Houston, MD;  [facility];  Service: Endoscopy;  Laterality: N/A;  1200  . DILATION AND CURETTAGE OF UTERUS    . ESOPHAGOGASTRODUODENOSCOPY  12/18/2011   Procedure: ESOPHAGOGASTRODUODENOSCOPY (EGD);  Surgeon: Rogene Houston, MD;  [facility];  Service: Endoscopy;  Laterality: N/A;  200  . TONSILLECTOMY       Objective: Blood pressure 130/68, pulse 63, temperature (!) 97 F (36.1 C), temperature source Oral, resp. rate 18, height 5' 5"  (1.651 m),  weight 140 lb 6.4 oz (63.7 kg). Patient is alert and in no acute distress. She has hearing impairment. Conjunctiva is pink. Sclera is nonicteric Oropharyngeal mucosa is normal. No neck masses or thyromegaly noted. Cardiac exam with regular rhythm normal S1 and S2.  She has grade 2/6 systolic murmur heard all over the precordium but best at aortic area. Lungs are clear to auscultation. Abdomen is symmetrical.  Bowel sounds are normal.  She has mild tenderness in right mid abdomen but no organomegaly or  masses. Rectal examination reveals soft stool in the vault and it is guaiac negative. No LE edema or clubbing noted.  Labs/studies Results: Lab data from 02/06/2017 WBC 4.6 H&H 12.6 and 38.5 Platelet count 260 9K.   Assessment:  #1.  Distal ulcerative colitis.  She appears to be in remission based on her symptoms.  Hemoccult was +31-monthago but negative today.  Last colonoscopy was in October 2013.  She may consider diagnostic colonoscopy because of this finding.  She certainly could have low-grade disease despite normal bowel movements.  She is not sure if she wants to proceed with colonoscopy because she finds prep very difficult.  #2.  Chronic GERD.  She is doing well with low-dose PPI.  Will try further dose reduction by using it at alternate days and taking Pepcid OTC on off days.  Plan:  Patient will go to the lab for CBC. Take omeprazole 20 mg alternating with Pepcid OTC 20 mg every other day. Further recommendations to follow.

## 2018-04-14 NOTE — Patient Instructions (Addendum)
Can alternate Pepcid OTC 20 mg with Omeprazole 20 mg every other day. Physician will call with results of blood work.

## 2018-04-17 LAB — CBC
Hematocrit: 39.6 % (ref 34.0–46.6)
Hemoglobin: 13.2 g/dL (ref 11.1–15.9)
MCH: 29.3 pg (ref 26.6–33.0)
MCHC: 33.3 g/dL (ref 31.5–35.7)
MCV: 88 fL (ref 79–97)
Platelets: 291 10*3/uL (ref 150–450)
RBC: 4.5 x10E6/uL (ref 3.77–5.28)
RDW: 13.7 % (ref 12.3–15.4)
WBC: 5.9 10*3/uL (ref 3.4–10.8)

## 2018-04-20 ENCOUNTER — Telehealth (INDEPENDENT_AMBULATORY_CARE_PROVIDER_SITE_OTHER): Payer: Self-pay | Admitting: Internal Medicine

## 2018-04-20 NOTE — Telephone Encounter (Signed)
Patient called stated Dr Laural Golden had left a message about her lab results on her answering machine and she accidentally deleted it - would like a call back at [phone]

## 2018-04-21 NOTE — Telephone Encounter (Signed)
Returned call. Patient was given her Hgb 13.2 .

## 2018-07-07 ENCOUNTER — Encounter: Payer: Self-pay | Admitting: Internal Medicine

## 2018-07-13 ENCOUNTER — Encounter (INDEPENDENT_AMBULATORY_CARE_PROVIDER_SITE_OTHER): Payer: Self-pay

## 2018-07-21 ENCOUNTER — Telehealth (INDEPENDENT_AMBULATORY_CARE_PROVIDER_SITE_OTHER): Payer: Self-pay | Admitting: Internal Medicine

## 2018-07-21 NOTE — Telephone Encounter (Signed)
Patient called stated she had sent a message thru mychart but didn't know if it went thru - told her you were off this week - she wants to change her medication to Aciphex - would like you to call er back at [phone]

## 2018-07-28 ENCOUNTER — Ambulatory Visit (INDEPENDENT_AMBULATORY_CARE_PROVIDER_SITE_OTHER): Payer: Self-pay

## 2018-07-28 ENCOUNTER — Ambulatory Visit (INDEPENDENT_AMBULATORY_CARE_PROVIDER_SITE_OTHER): Payer: Medicare Other | Admitting: Orthopaedic Surgery

## 2018-07-28 ENCOUNTER — Encounter (INDEPENDENT_AMBULATORY_CARE_PROVIDER_SITE_OTHER): Payer: Self-pay | Admitting: Orthopaedic Surgery

## 2018-07-28 VITALS — BP 153/80 | HR 70 | Ht 65.0 in | Wt 140.0 lb

## 2018-07-28 DIAGNOSIS — M79645 Pain in left finger(s): Secondary | ICD-10-CM

## 2018-07-28 DIAGNOSIS — M25572 Pain in left ankle and joints of left foot: Secondary | ICD-10-CM | POA: Diagnosis not present

## 2018-07-28 NOTE — Progress Notes (Signed)
Office Visit Note   Patient: Autumn Dean           Date of Birth: Aug 25, 1933           MRN: [id number] Visit Date: 07/28/2018              Requested by: Glenda Chroman, MD [place], [place] [address] PCP: Glenda Chroman, MD   Assessment & Plan: Visit Diagnoses:  1. Pain in left ankle and joints of left foot   2. Pain in left finger(s)     Plan: Non-displaced distal left fibula fracture.  Contusion left ring finger-no treatment necessary.  Will apply equalizer boot to left lower extremity and reevaluate films in 2 weeks  Follow-Up Instructions: Return in about 3 weeks (around 08/18/2018).   Orders:  Orders Placed This Encounter  Procedures  . XR Ankle 2 Views Left  . XR Finger Ring Left   No orders of the defined types were placed in this encounter.     Procedures: No procedures performed   Clinical Data: No additional findings.   Subjective: Chief Complaint  Patient presents with  . New Patient (Initial Visit)    07/25/18 pt stepped into hole has limping and bruising and swelling also has l 4th finger pain and swelling  Autumn Dean is 84 years old and slipped in a hole this past Sunday night i.e. 3 days ago.  She apparently lost her balance.  She is been experiencing pain in the lateral aspect of her left ankle with some swelling.  She is also had a little bit of discomfort about the left ring finger with some very minimal swelling.  She was hesitant to go to the emergency room as she is had an issue with them in the past he is walking fairly well without any ambulatory aid  HPI  Review of Systems  Constitutional: Negative for fatigue and fever.  HENT: Negative for ear pain.   Eyes: Negative for pain.  Respiratory: Negative for cough and shortness of breath.   Cardiovascular: Positive for leg swelling.  Gastrointestinal: Negative for constipation and diarrhea.  Genitourinary: Negative for difficulty urinating.  Musculoskeletal: Negative for back pain  and neck pain.  Skin: Negative for rash.  Allergic/Immunologic: Negative for food allergies.  Neurological: Positive for weakness. Negative for numbness.  Hematological: Bruises/bleeds easily.  Psychiatric/Behavioral: Negative for sleep disturbance.     Objective: Vital Signs: BP (!) 153/80 (BP Location: Left Arm, Patient Position: Sitting, Cuff Size: Normal)   Pulse 70   Ht 5' 5"  (1.651 m)   Wt 140 lb (63.5 kg)   BMI 23.30 kg/m   Physical Exam  Constitutional: She is oriented to person, place, and time. She appears well-developed and well-nourished.  HENT:  Mouth/Throat: Oropharynx is clear and moist.  Eyes: Pupils are equal, round, and reactive to light. EOM are normal.  Pulmonary/Chest: Effort normal.  Neurological: She is alert and oriented to person, place, and time.  Skin: Skin is warm and dry.  Psychiatric: She has a normal mood and affect. Her behavior is normal.    Ortho Exam awake alert and oriented x3.  Comfortable sitting.  A little hard of hearing.  Does have some swelling-mild left lower extremity with some discomfort over the lateral ankle.  Minimal tenderness medially.  No obvious deformity.  Skin intact.  Normal sensibility.  Films consistent with a nondisplaced distal fibula fracture.  Specialty Comments:  No specialty comments available.  Imaging: Xr Finger Ring  Left  Result Date: 07/28/2018 Films of the left hand specifically the left ring finger were obtained in several projections.  There was a very small area of ectopic calcification at the PIP joint which may represent a capsular avulsion injury but no obvious fracture of any of the phalanges.  Range of motion and minimal discomfort without swelling over the PIP joint so that may be old.  Arthritic changes about the DIP and PIP joint of the ring finger    PMFS History: Patient Active Problem List   Diagnosis Date Noted  . Acute right-sided low back pain 04/16/2017  . Tricuspid regurgitation  08/01/2014  . Mitral regurgitation 07/08/2013  . GERD (gastroesophageal reflux disease) 12/04/2011  . UC (ulcerative colitis confined to rectum) ([place]) 12/04/2011  . Hypertension 12/04/2011   Past Medical History:  Diagnosis Date  . GERD (gastroesophageal reflux disease)   . Hiatal hernia   . Hypertension   . Proctitis   . UC (ulcerative colitis confined to rectum) [facility])     Family History  Problem Relation Age of Onset  . Colon cancer Neg Hx     Past Surgical History:  Procedure Laterality Date  . COLONOSCOPY    . COLONOSCOPY  08/18/2012   Procedure: COLONOSCOPY;  Surgeon: Rogene Houston, MD;  [facility];  Service: Endoscopy;  Laterality: N/A;  1200  . DILATION AND CURETTAGE OF UTERUS    . ESOPHAGOGASTRODUODENOSCOPY  12/18/2011   Procedure: ESOPHAGOGASTRODUODENOSCOPY (EGD);  Surgeon: Rogene Houston, MD;  [facility];  Service: Endoscopy;  Laterality: N/A;  200  . TONSILLECTOMY     Social History   Occupational History  . Not on file  Tobacco Use  . Smoking status: Never Smoker  . Smokeless tobacco: Never Used  Substance and Sexual Activity  . Alcohol use: No    Alcohol/week: 0.0 standard drinks  . Drug use: No  . Sexual activity: Not on file

## 2018-07-29 ENCOUNTER — Telehealth (INDEPENDENT_AMBULATORY_CARE_PROVIDER_SITE_OTHER): Payer: Self-pay | Admitting: *Deleted

## 2018-07-29 ENCOUNTER — Telehealth (INDEPENDENT_AMBULATORY_CARE_PROVIDER_SITE_OTHER): Payer: Self-pay | Admitting: Internal Medicine

## 2018-07-29 ENCOUNTER — Encounter (INDEPENDENT_AMBULATORY_CARE_PROVIDER_SITE_OTHER): Payer: Self-pay

## 2018-07-29 ENCOUNTER — Telehealth (INDEPENDENT_AMBULATORY_CARE_PROVIDER_SITE_OTHER): Payer: Self-pay | Admitting: Orthopaedic Surgery

## 2018-07-29 MED ORDER — RABEPRAZOLE SODIUM 20 MG PO TBEC: 20.0000 mg | DELAYED_RELEASE_TABLET | Freq: Every day | ORAL | 3 refills | Status: DC

## 2018-07-29 NOTE — Telephone Encounter (Signed)
Let her know Rx has been sent.

## 2018-07-29 NOTE — Telephone Encounter (Signed)
NOTIFIED PT OF SHOE LIFT FOR WALKER BOOT

## 2018-07-29 NOTE — Telephone Encounter (Signed)
Autumn Dean -- patient is asking for her gastric rdeucer to be changed to Aciphex, she has taken it is the past -- what she is taking now isn;t working - requesting enough until her appt with Rehman in December  please advise

## 2018-07-29 NOTE — Telephone Encounter (Signed)
Patient left a voicemail stating she talked to Roxy at the [facility] office yesterday and she gave her information regarding a shoe.  Patient states she lost the information and is requesting a return call.

## 2018-07-29 NOTE — Telephone Encounter (Signed)
Rx sent to her pharmacy 

## 2018-07-29 NOTE — Telephone Encounter (Signed)
MyChart message sent to patient advising Rx has been sent to pharmacy

## 2018-08-02 ENCOUNTER — Ambulatory Visit (INDEPENDENT_AMBULATORY_CARE_PROVIDER_SITE_OTHER): Payer: Self-pay

## 2018-08-02 ENCOUNTER — Other Ambulatory Visit (INDEPENDENT_AMBULATORY_CARE_PROVIDER_SITE_OTHER): Payer: Self-pay | Admitting: *Deleted

## 2018-08-02 ENCOUNTER — Encounter (INDEPENDENT_AMBULATORY_CARE_PROVIDER_SITE_OTHER): Payer: Self-pay | Admitting: Orthopaedic Surgery

## 2018-08-02 ENCOUNTER — Ambulatory Visit (INDEPENDENT_AMBULATORY_CARE_PROVIDER_SITE_OTHER): Payer: Medicare Other | Admitting: Orthopaedic Surgery

## 2018-08-02 VITALS — BP 154/81 | HR 80 | Ht 65.0 in | Wt 140.0 lb

## 2018-08-02 DIAGNOSIS — M25572 Pain in left ankle and joints of left foot: Secondary | ICD-10-CM | POA: Diagnosis not present

## 2018-08-02 DIAGNOSIS — M79662 Pain in left lower leg: Secondary | ICD-10-CM

## 2018-08-02 NOTE — Progress Notes (Signed)
Office Visit Note   Patient: Autumn Dean           Date of Birth: [birth year]           MRN: [id number] Visit Date: 08/02/2018              Requested by: Glenda Chroman, MD [place], [place] [address] PCP: Glenda Chroman, MD   Assessment & Plan: Visit Diagnoses:  1. Pain in left ankle and joints of left foot     Plan: Autumn Dean was seen in the [place] office last week for evaluation of left ankle pain.  She has a nondisplaced distal fibula fracture.  She was placed in an equalizer boot and using a rolling walker.  She apparently has been having more trouble with her leg called the on-call physician.  She was told to come to the [place] office.  Repeat films revealed no change in position of the fracture.  She does have chronic lymphedema of her leg and is having multiple areas of tenderness including the posterior aspect of her calf.  Will order Doppler study and follow-up in [place] Dr. Woody Seller is her family physician  Follow-Up Instructions: Return in about 1 week (around 08/09/2018).   Orders:  Orders Placed This Encounter  Procedures  . XR Ankle Complete Left   No orders of the defined types were placed in this encounter.     Procedures: No procedures performed   Clinical Data: No additional findings.   Subjective: Chief Complaint  Patient presents with  . Follow-up      Non-displaced distal left fibula fracture. 07/25/18  Autumn Dean is experiencing it more pain in her left ankle since she was seen 80 the last week.  No new injury or trauma.  She does live at home.  Has chronic lymphedema of her left lower extremity with multiple areas of tenderness.  Wears a support stocking  HPI  Review of Systems  Constitutional: Negative for fatigue and fever.  HENT: Negative for ear pain.   Eyes: Negative for pain.  Respiratory: Negative for cough and shortness of breath.   Cardiovascular: Positive for leg swelling.  Gastrointestinal: Negative for constipation and  diarrhea.  Genitourinary: Negative for difficulty urinating.  Musculoskeletal: Positive for back pain. Negative for neck pain.  Skin: Negative for rash.  Allergic/Immunologic: Negative for food allergies.  Neurological: Positive for weakness. Negative for numbness.  Hematological: Bruises/bleeds easily.  Psychiatric/Behavioral: Positive for sleep disturbance.     Objective: Vital Signs: BP (!) 154/81 (BP Location: Left Arm, Patient Position: Sitting, Cuff Size: Normal)   Pulse 80   Ht 5' 5"  (1.651 m)   Wt 140 lb (63.5 kg)   BMI 23.30 kg/m   Physical Exam  Constitutional: She is oriented to person, place, and time. She appears well-developed and well-nourished.  HENT:  Mouth/Throat: Oropharynx is clear and moist.  Eyes: Pupils are equal, round, and reactive to light. EOM are normal.  Pulmonary/Chest: Effort normal.  Neurological: She is alert and oriented to person, place, and time.  Skin: Skin is warm and dry.  Psychiatric: She has a normal mood and affect. Her behavior is normal.    Ortho Exam awake alert and oriented x3.  Hard of hearing.  Chronic lymphedema of left leg below the knee and wearing support stocking.  Multiple areas of tenderness.  Has discomfort over the distal fibula noted previously.  Skin otherwise intact  Specialty Comments:  No specialty comments available.  Imaging:  Xr Ankle Complete Left  Result Date: 08/02/2018 Films of the left ankle were obtained and compared to those that were performed in each in the last week.  There is essentially nondisplaced fracture of the distal fibula just below the ankle joint line.  Use bony demineralization.  Change in position of the fracture    PMFS History: Patient Active Problem List   Diagnosis Date Noted  . Acute right-sided low back pain 04/16/2017  . Tricuspid regurgitation 08/01/2014  . Mitral regurgitation 07/08/2013  . GERD (gastroesophageal reflux disease) 12/04/2011  . UC (ulcerative colitis confined  to rectum) ([place]) 12/04/2011  . Hypertension 12/04/2011   Past Medical History:  Diagnosis Date  . GERD (gastroesophageal reflux disease)   . Hiatal hernia   . Hypertension   . Proctitis   . UC (ulcerative colitis confined to rectum) [facility])     Family History  Problem Relation Age of Onset  . Colon cancer Neg Hx     Past Surgical History:  Procedure Laterality Date  . COLONOSCOPY    . COLONOSCOPY  08/18/2012   Procedure: COLONOSCOPY;  Surgeon: Rogene Houston, MD;  [facility];  Service: Endoscopy;  Laterality: N/A;  1200  . DILATION AND CURETTAGE OF UTERUS    . ESOPHAGOGASTRODUODENOSCOPY  12/18/2011   Procedure: ESOPHAGOGASTRODUODENOSCOPY (EGD);  Surgeon: Rogene Houston, MD;  [facility];  Service: Endoscopy;  Laterality: N/A;  200  . TONSILLECTOMY     Social History   Occupational History  . Not on file  Tobacco Use  . Smoking status: Never Smoker  . Smokeless tobacco: Never Used  Substance and Sexual Activity  . Alcohol use: No    Alcohol/week: 0.0 standard drinks  . Drug use: No  . Sexual activity: Not on file

## 2018-08-03 ENCOUNTER — Other Ambulatory Visit: Payer: Self-pay | Admitting: *Deleted

## 2018-08-03 ENCOUNTER — Encounter: Payer: Self-pay | Admitting: Orthopaedic Surgery

## 2018-08-03 ENCOUNTER — Telehealth (INDEPENDENT_AMBULATORY_CARE_PROVIDER_SITE_OTHER): Payer: Self-pay

## 2018-08-03 NOTE — Telephone Encounter (Signed)
Pt was sch to have a doppler to r/o dvt order clarification needed. Says doppler BLE and the pt is only complaining of pain in one extremity message was left on triage voicemail

## 2018-08-04 NOTE — Telephone Encounter (Signed)
ORDER WAS CORRECTED AND SENT IN FROM [facility]

## 2018-08-05 NOTE — Telephone Encounter (Signed)
Will address with Dr.Rehman.

## 2018-08-11 ENCOUNTER — Ambulatory Visit (INDEPENDENT_AMBULATORY_CARE_PROVIDER_SITE_OTHER): Payer: Medicare Other | Admitting: Orthopaedic Surgery

## 2018-08-11 VITALS — BP 143/73 | HR 75 | Ht 65.0 in | Wt 140.0 lb

## 2018-08-11 DIAGNOSIS — M79672 Pain in left foot: Secondary | ICD-10-CM

## 2018-08-11 NOTE — Progress Notes (Signed)
   Office Visit Note   Patient: Autumn Dean           Date of Birth: [birth year]           MRN: [id number] Visit Date: 08/11/2018              Requested by: Glenda Chroman, MD [place], [place] [address] PCP: Glenda Chroman, MD   Assessment & Plan: Visit Diagnoses:  1. Left foot pain     Plan: just over 2 weeks s/p non displaced left distal fibula fx=doingwell. Offic e3 weeks for films  Follow-Up Instructions: Return in about 3 weeks (around 09/01/2018).   Orders:  No orders of the defined types were placed in this encounter.  No orders of the defined types were placed in this encounter.     Procedures: No procedures performed   Clinical Data: No additional findings.   Subjective: No chief complaint on file. less pain. Using boot and rolling walker without problem  HPI  Review of Systems   Objective: Vital Signs: There were no vitals taken for this visit.  Physical Exam  Ortho Examleft ankle with minimal pain,minimal swelling and no deformity. Skin intact  Specialty Comments:  No specialty comments available.  Imaging: No results found.   PMFS History: Patient Active Problem List   Diagnosis Date Noted  . Acute right-sided low back pain 04/16/2017  . Tricuspid regurgitation 08/01/2014  . Mitral regurgitation 07/08/2013  . GERD (gastroesophageal reflux disease) 12/04/2011  . UC (ulcerative colitis confined to rectum) ([place]) 12/04/2011  . Hypertension 12/04/2011   Past Medical History:  Diagnosis Date  . GERD (gastroesophageal reflux disease)   . Hiatal hernia   . Hypertension   . Proctitis   . UC (ulcerative colitis confined to rectum) [facility])     Family History  Problem Relation Age of Onset  . Colon cancer Neg Hx     Past Surgical History:  Procedure Laterality Date  . COLONOSCOPY    . COLONOSCOPY  08/18/2012   Procedure: COLONOSCOPY;  Surgeon: Rogene Houston, MD;  [facility];  Service: Endoscopy;  Laterality: N/A;   1200  . DILATION AND CURETTAGE OF UTERUS    . ESOPHAGOGASTRODUODENOSCOPY  12/18/2011   Procedure: ESOPHAGOGASTRODUODENOSCOPY (EGD);  Surgeon: Rogene Houston, MD;  [facility];  Service: Endoscopy;  Laterality: N/A;  200  . TONSILLECTOMY     Social History   Occupational History  . Not on file  Tobacco Use  . Smoking status: Never Smoker  . Smokeless tobacco: Never Used  Substance and Sexual Activity  . Alcohol use: No    Alcohol/week: 0.0 standard drinks  . Drug use: No  . Sexual activity: Not on file     Garald Balding, MD   Note - This record has been created using [employer].  Chart creation errors have been sought, but may not always  have been located. Such creation errors do not reflect on  the standard of medical care.

## 2018-09-01 ENCOUNTER — Ambulatory Visit (INDEPENDENT_AMBULATORY_CARE_PROVIDER_SITE_OTHER): Payer: Medicare Other | Admitting: Orthopaedic Surgery

## 2018-09-01 ENCOUNTER — Encounter (INDEPENDENT_AMBULATORY_CARE_PROVIDER_SITE_OTHER): Payer: Self-pay | Admitting: Orthopaedic Surgery

## 2018-09-01 ENCOUNTER — Ambulatory Visit (INDEPENDENT_AMBULATORY_CARE_PROVIDER_SITE_OTHER): Payer: Self-pay

## 2018-09-01 VITALS — BP 151/80 | HR 82 | Ht 65.0 in | Wt 140.0 lb

## 2018-09-01 DIAGNOSIS — M25572 Pain in left ankle and joints of left foot: Secondary | ICD-10-CM | POA: Diagnosis not present

## 2018-09-01 NOTE — Progress Notes (Signed)
Office Visit Note   Patient: Autumn Dean           Date of Birth: August 04, 1933           MRN: [id number] Visit Date: 09/01/2018              Requested by: Glenda Chroman, MD [place], [place] [address] PCP: Glenda Chroman, MD   Assessment & Plan: Visit Diagnoses:  1. Pain in left ankle and joints of left foot     Plan: New films demonstrated excellent position of the distal fibula fracture.  Autumn Dean is quite comfortable fully weightbearing in the equalizer boot with a rolling walker.  Will reevaluate in 2 weeks.  No need for further films  Follow-Up Instructions: Return in about 2 weeks (around 09/15/2018).   Orders:  Orders Placed This Encounter  Procedures  . XR Ankle Complete Left   No orders of the defined types were placed in this encounter.     Procedures: No procedures performed   Clinical Data: No additional findings.   Subjective: Chief Complaint  Patient presents with  . Follow-up    07/25/18 F/U DISTAL LEFT FIBULA FX  Very well.  No related pain.  Fully ambulatory in the boot using the rolling walker  HPI  Review of Systems  Constitutional: Positive for fatigue. Negative for fever.  HENT: Negative for ear pain.   Eyes: Negative for pain.  Respiratory: Negative for cough and shortness of breath.   Cardiovascular: Negative for leg swelling.  Gastrointestinal: Negative for constipation and diarrhea.  Genitourinary: Negative for difficulty urinating.  Musculoskeletal: Negative for back pain and neck pain.  Skin: Negative for rash.  Allergic/Immunologic: Negative for food allergies.  Neurological: Positive for weakness. Negative for numbness.  Hematological: Bruises/bleeds easily.  Psychiatric/Behavioral: Negative for sleep disturbance.     Objective: Vital Signs: BP (!) 151/80 (BP Location: Left Arm, Patient Position: Sitting, Cuff Size: Normal)   Pulse 82   Ht 5' 5"  (1.651 m)   Wt 140 lb (63.5 kg)   BMI 23.30 kg/m   Physical  Exam  Constitutional: She is oriented to person, place, and time. She appears well-developed and well-nourished.  HENT:  Mouth/Throat: Oropharynx is clear and moist.  Eyes: Pupils are equal, round, and reactive to light. EOM are normal.  Pulmonary/Chest: Effort normal.  Neurological: She is alert and oriented to person, place, and time.  Skin: Skin is warm and dry.  Psychiatric: She has a normal mood and affect. Her behavior is normal.    Ortho Exam exam performed with equalizer boot in place per her preference.  No calf pain.  No percussible tenderness over the lateral malleolus.  Walking without a limp  Specialty Comments:  No specialty comments available.  Imaging: Xr Ankle Complete Left  Result Date: 09/01/2018 Films of the left ankle demonstrate no change in the position of the transverse distal fibula fracture at the level of the ankle joint.  Ankle mortise intact no abnormality of the medial malleolus    PMFS History: Patient Active Problem List   Diagnosis Date Noted  . Acute right-sided low back pain 04/16/2017  . Tricuspid regurgitation 08/01/2014  . Mitral regurgitation 07/08/2013  . GERD (gastroesophageal reflux disease) 12/04/2011  . UC (ulcerative colitis confined to rectum) ([place]) 12/04/2011  . Hypertension 12/04/2011   Past Medical History:  Diagnosis Date  . GERD (gastroesophageal reflux disease)   . Hiatal hernia   . Hypertension   . Proctitis   .  UC (ulcerative colitis confined to rectum) [facility])     Family History  Problem Relation Age of Onset  . Colon cancer Neg Hx     Past Surgical History:  Procedure Laterality Date  . COLONOSCOPY    . COLONOSCOPY  08/18/2012   Procedure: COLONOSCOPY;  Surgeon: Rogene Houston, MD;  [facility];  Service: Endoscopy;  Laterality: N/A;  1200  . DILATION AND CURETTAGE OF UTERUS    . ESOPHAGOGASTRODUODENOSCOPY  12/18/2011   Procedure: ESOPHAGOGASTRODUODENOSCOPY (EGD);  Surgeon: Rogene Houston, MD;   [facility];  Service: Endoscopy;  Laterality: N/A;  200  . TONSILLECTOMY     Social History   Occupational History  . Not on file  Tobacco Use  . Smoking status: Never Smoker  . Smokeless tobacco: Never Used  Substance and Sexual Activity  . Alcohol use: No    Alcohol/week: 0.0 standard drinks  . Drug use: No  . Sexual activity: Not on file

## 2018-09-15 ENCOUNTER — Encounter (INDEPENDENT_AMBULATORY_CARE_PROVIDER_SITE_OTHER): Payer: Self-pay | Admitting: Orthopaedic Surgery

## 2018-09-15 ENCOUNTER — Ambulatory Visit (INDEPENDENT_AMBULATORY_CARE_PROVIDER_SITE_OTHER): Payer: Medicare Other | Admitting: Orthopaedic Surgery

## 2018-09-15 VITALS — BP 151/74 | HR 73 | Ht 65.0 in | Wt 140.0 lb

## 2018-09-15 DIAGNOSIS — M79672 Pain in left foot: Secondary | ICD-10-CM

## 2018-09-15 NOTE — Progress Notes (Signed)
Office Visit Note   Patient: Autumn Dean           Date of Birth: 16-Mar-1933           MRN: [id number] Visit Date: 09/15/2018              Requested by: Glenda Chroman, MD [address], [place] [address] PCP: Glenda Chroman, MD   Assessment & Plan: Visit Diagnoses:  1. Left foot pain     Plan: Nearly 2 months status post nondisplaced fracture of the left distal fibula.  No pain.  Discontinue the boot weightbearing as tolerated office as needed.  [place] 2 weeks ago demonstrate anatomic position.  No need for new films  Follow-Up Instructions: Return if symptoms worsen or fail to improve.   Orders:  No orders of the defined types were placed in this encounter.  No orders of the defined types were placed in this encounter.     Procedures: No procedures performed   Clinical Data: No additional findings.   Subjective: Chief Complaint  Patient presents with  . Follow-up    NON DISPLACED DISTAL FIBULA FX 07/28/18   Doing well using the equalizer boot full weightbearing.  No related pain.  HPI  Review of Systems  Constitutional: Negative for fatigue.  HENT: Negative for ear pain.   Eyes: Negative for pain.  Respiratory: Negative for cough and shortness of breath.   Cardiovascular: Negative for leg swelling.  Gastrointestinal: Negative for constipation and diarrhea.  Genitourinary: Negative for difficulty urinating.  Musculoskeletal: Negative for back pain and neck pain.  Skin: Negative for rash.  Allergic/Immunologic: Negative for food allergies.  Neurological: Negative for weakness and numbness.  Hematological: Does not bruise/bleed easily.  Psychiatric/Behavioral: Negative for sleep disturbance.     Objective: Vital Signs: BP (!) 151/74 (BP Location: Left Arm, Patient Position: Sitting, Cuff Size: Normal)   Pulse 73   Ht 5' 5"  (1.651 m)   Wt 140 lb (63.5 kg)   BMI 23.30 kg/m   Physical Exam  Ortho Exam awake alert and oriented x3 comfortable  sitting.  Hard of hearing.  Equalizer boot removed from left lower extremity.  Mild nonpitting edema of ankle.  No pain over the distal fibula.  No instability.  No pain with motion of her ankle.  Skin intact.  Specialty Comments:  No specialty comments available.  Imaging: No results found.   PMFS History: Patient Active Problem List   Diagnosis Date Noted  . Acute right-sided low back pain 04/16/2017  . Tricuspid regurgitation 08/01/2014  . Mitral regurgitation 07/08/2013  . GERD (gastroesophageal reflux disease) 12/04/2011  . UC (ulcerative colitis confined to rectum) ([place]) 12/04/2011  . Hypertension 12/04/2011   Past Medical History:  Diagnosis Date  . GERD (gastroesophageal reflux disease)   . Hiatal hernia   . Hypertension   . Proctitis   . UC (ulcerative colitis confined [facility] rectum) [facility])     Family History  Problem Relation Age of Onset  . Colon cancer Neg Hx     Past Surgical History:  Procedure Laterality Date  . COLONOSCOPY    . COLONOSCOPY  08/18/2012   Procedure: COLONOSCOPY;  Surgeon: Rogene Houston, MD;  [facility];  Service: Endoscopy;  Laterality: N/A;  1200  . DILATION AND CURETTAGE OF UTERUS    . ESOPHAGOGASTRODUODENOSCOPY  12/18/2011   Procedure: ESOPHAGOGASTRODUODENOSCOPY (EGD);  Surgeon: Rogene Houston, MD;  Location: [facility];  Service: Endoscopy;  Laterality:  N/A;  200  . TONSILLECTOMY     Social History   Occupational History  . Not on file  Tobacco Use  . Smoking status: Never Smoker  . Smokeless tobacco: Never Used  Substance and Sexual Activity  . Alcohol use: No    Alcohol/week: 0.0 standard drinks  . Drug use: No  . Sexual activity: Not on file

## 2018-10-14 ENCOUNTER — Telehealth (INDEPENDENT_AMBULATORY_CARE_PROVIDER_SITE_OTHER): Payer: Self-pay | Admitting: Internal Medicine

## 2018-10-14 NOTE — Telephone Encounter (Signed)
Patient called stated she has an appt with NUR on 12-17 and she feels like she needs to come by and pick up a stool card

## 2018-10-14 NOTE — Telephone Encounter (Signed)
This will be okay - Stool card will be up front for the patient to pick up. Patient was made aware.

## 2018-10-19 ENCOUNTER — Ambulatory Visit (INDEPENDENT_AMBULATORY_CARE_PROVIDER_SITE_OTHER): Payer: Medicare Other | Admitting: Internal Medicine

## 2018-10-19 ENCOUNTER — Encounter (INDEPENDENT_AMBULATORY_CARE_PROVIDER_SITE_OTHER): Payer: Self-pay | Admitting: Internal Medicine

## 2018-10-19 ENCOUNTER — Telehealth (INDEPENDENT_AMBULATORY_CARE_PROVIDER_SITE_OTHER): Payer: Self-pay | Admitting: *Deleted

## 2018-10-19 VITALS — BP 136/90 | HR 64 | Temp 97.7°F | Resp 18 | Ht 65.0 in | Wt 143.3 lb

## 2018-10-19 DIAGNOSIS — K51211 Ulcerative (chronic) proctitis with rectal bleeding: Secondary | ICD-10-CM

## 2018-10-19 MED ORDER — SULFASALAZINE 500 MG PO TABS: 1000.0000 mg | ORAL_TABLET | Freq: Three times a day (TID) | ORAL | 2 refills | Status: DC

## 2018-10-19 NOTE — Patient Instructions (Signed)
Please call office with progress report in 4 weeks or earlier if symptoms worsen.

## 2018-10-19 NOTE — Telephone Encounter (Signed)
   Diagnosis:    Result(s)   Card 1: Positive         Completed by: Thomas Hoff , LpN   HEMOCCULT SENSA DEVELOPER: LOT#:  [id number] EXPIRATION DATE: 2021-11   HEMOCCULT SENSA CARD:  LOT#:  [id number] EXPIRATION DATE: 05/21   CARD CONTROL RESULTS:  POSITIVE: Posittive NEGATIVE: Negative    ADDITIONAL COMMENTS: Patient was given the result. Dr. Laural Golden will be made aware.

## 2018-10-19 NOTE — Progress Notes (Signed)
Presenting complaint;  Follow-up for ulcerative colitis.  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  She previously had been on oral mesalamine and she was switched to sulfasalazine as demanded by her insurance company. She now presents with rectal bleeding excessive mucus and lower abdominal pain which started recently.  She says she has a good appetite.  She denies nausea or vomiting.  She says she had recurrent UTI and has been treated with different antibiotics.  Last one was Macrobid which she stopped after 6 days because of dizziness.  She wonders if this caused flareup of her UC.  She states she is having formed formed stool daily.  Occasionally it may be hard. She did bring [place] a stool sample from this morning and it is guaiac positive. She states heartburn is well controlled with rabeprazole and she is quite pleased.  Current Medications: Outpatient Encounter Medications as of 10/19/2018  Medication Sig  . AZO-CRANBERRY PO Take 2 tablets by mouth daily.   . bisoprolol-hydrochlorothiazide (ZIAC) 5-6.25 MG per tablet Take 1 tablet by mouth daily.  . Calcium Carbonate-Vit D-Min (CALCIUM 600+D PLUS MINERALS) 600-400 MG-UNIT TABS Take by mouth 2 (two) times daily.   . cyanocobalamin 500 MCG tablet Take 1,000 mcg by mouth daily.   . folic acid (FOLVITE) 1 MG tablet TAKE 1 TABLET BY MOUTH EVERY DAY  . psyllium (METAMUCIL) 58.6 % packet Take 1 packet by mouth daily.  . RABEprazole (ACIPHEX) 20 MG tablet Take 1 tablet (20 mg total) by mouth daily.  . Simethicone (PHAZYME) 180 MG CAPS Take 1 capsule (180 mg total) by mouth every 6 (six) hours as needed.  . sulfaSALAzine (AZULFIDINE) 500 MG tablet TAKE 1 TABLET BY MOUTH THREE TIMES DAILY  . [DISCONTINUED] fluticasone (FLONASE) 50 MCG/ACT nasal spray Place 2 sprays into the nose daily.  . [DISCONTINUED]  omeprazole (PRILOSEC) 20 MG capsule Take 1 capsule (20 mg total) by mouth daily. (Patient not taking: Reported on 10/19/2018)  . [DISCONTINUED] sucralfate (CARAFATE) 1 GM/10ML suspension Take 10 mLs (1 g total) by mouth 4 (four) times daily.   No facility-administered encounter medications on file as of 10/19/2018.      Objective: Blood pressure 136/90, pulse 64, temperature 97.7 F (36.5 C), temperature source Oral, resp. rate 18, height 5' 5"  (1.651 m), weight 143 lb 4.8 oz (65 kg). Patient is alert and in no acute distress. She has hearing impairments. Conjunctiva is pink. Sclera is nonicteric Oropharyngeal mucosa is normal. No neck masses or thyromegaly noted. Cardiac exam with regular rhythm normal S1 and S2. No murmur or gallop noted. Lungs are clear to auscultation. Abdomen is symmetrical and soft with mild tenderness in hypogastric region.  No organomegaly or masses. No LE edema or clubbing noted.  Labs/studies Results: Hemoccult card from this morning is positive for occult blood.  Patient states her hemoglobin was greater than 13 g in August 2019.   Assessment:  #1.  Ulcerative colitis.  Last colonoscopy was in September 2013 revealing active disease in sigmoid colon and rectum.  Her symptoms suggest active disease primarily in the rectum as she is not having diarrhea.  Symptoms may have been triggered with antibiotic use for UTI.  She does not appear to be acutely ill and she also does not have constitutional symptoms.  If she does not respond to higher dose of sulfasalazine may consider  diagnostic flexible sigmoidoscopy.  #2.  Chronic GERD.  She is doing well with rabeprazole.  Plan:  Increase sulfasalazine to 1 g p.o. 3 times daily.  Take it after each meal. Patient will call with progress report in 4 weeks or earlier if symptoms worsen. If she does not respond to higher dose of sulfasalazine will consider diagnostic flexible sigmoidoscopy. Office visit in 6  months.

## 2018-10-25 ENCOUNTER — Telehealth (INDEPENDENT_AMBULATORY_CARE_PROVIDER_SITE_OTHER): Payer: Self-pay | Admitting: *Deleted

## 2018-10-25 ENCOUNTER — Encounter (INDEPENDENT_AMBULATORY_CARE_PROVIDER_SITE_OTHER): Payer: Self-pay

## 2018-10-25 NOTE — Telephone Encounter (Signed)
After beginning increased dose Sulfasalazin on Friday I was nauseated , tired and drowsy . In general "felt very tired " . On Saturday I went back to regular dose . I was still very tired Sunday morning but better later in day . Since I will be going to family through Wednesday . I thought I would try to add 500 mg each day and see how that works . It has been 40 years since I have had a high does , so age is probably the reason I am having problems with it . Haven't had any mucus in 2 days . Will keep you updated .  [facility]

## 2018-10-26 NOTE — Telephone Encounter (Signed)
Talked with patient. She feels she is getting better as she is not passing mucus of blood anymore. She will call with progress report next week.

## 2018-12-21 ENCOUNTER — Encounter (INDEPENDENT_AMBULATORY_CARE_PROVIDER_SITE_OTHER): Payer: Self-pay

## 2018-12-22 ENCOUNTER — Other Ambulatory Visit (INDEPENDENT_AMBULATORY_CARE_PROVIDER_SITE_OTHER): Payer: Self-pay | Admitting: *Deleted

## 2018-12-22 MED ORDER — OMEPRAZOLE 20 MG PO CPDR: 20.0000 mg | DELAYED_RELEASE_CAPSULE | Freq: Every day | ORAL | 5 refills | Status: DC

## 2018-12-22 NOTE — Telephone Encounter (Signed)
Patient states that the Aciphex  Is a slow release and she is having issues with the heartburn /reflux. She would like to go back on the Prilosec as it works for her.Dr.Rehman states that we may change to Prilosec 1 month and 5 refills. THis was e-scribed to [facility] in [place]. Patient was sent a message through,My Chart.

## 2019-04-13 ENCOUNTER — Other Ambulatory Visit (INDEPENDENT_AMBULATORY_CARE_PROVIDER_SITE_OTHER): Payer: Self-pay | Admitting: Internal Medicine

## 2019-04-18 ENCOUNTER — Encounter (INDEPENDENT_AMBULATORY_CARE_PROVIDER_SITE_OTHER): Payer: Self-pay

## 2019-04-19 ENCOUNTER — Encounter (INDEPENDENT_AMBULATORY_CARE_PROVIDER_SITE_OTHER): Payer: Self-pay | Admitting: Internal Medicine

## 2019-04-19 ENCOUNTER — Other Ambulatory Visit: Payer: Self-pay

## 2019-04-19 ENCOUNTER — Ambulatory Visit (INDEPENDENT_AMBULATORY_CARE_PROVIDER_SITE_OTHER): Payer: Medicare Other | Admitting: Internal Medicine

## 2019-04-19 VITALS — BP 197/82 | HR 76 | Temp 98.0°F | Resp 18 | Ht 65.0 in | Wt 138.7 lb

## 2019-04-19 DIAGNOSIS — K219 Gastro-esophageal reflux disease without esophagitis: Secondary | ICD-10-CM

## 2019-04-19 DIAGNOSIS — K5150 Left sided colitis without complications: Secondary | ICD-10-CM | POA: Diagnosis not present

## 2019-04-19 MED ORDER — OMEPRAZOLE 20 MG PO CPDR: 20.0000 mg | DELAYED_RELEASE_CAPSULE | Freq: Every day | ORAL | 3 refills | Status: DC

## 2019-04-19 NOTE — Progress Notes (Signed)
Presenting complaint;  Follow-up for ulcerative colitis and GERD.  Subjective:  Patient is 85 year old Caucasian female who has chronic left-sided ulcerative colitis as well as GERD who is here for scheduled visit.  She was last seen in December 2019.  She states she is doing well.  She is having 1-2 formed stools daily.  She does not remember the last time she saw blood in her stool.  She did have Hemoccult by Dr. Woody Seller last month and it was negative.  She is not having any side effects with sulfasalazine.  She states only time she has heartburn is if he eats too much food.  She denies dysphagia nausea or vomiting.  Her appetite is good.  She has lost 5 pounds since her last visit.  She is happy to lose this weight.  However she is not driving.  She typically has 3 meals a day. She says she had blood work by Dr. Woody Seller last month and states her hemoglobin was normal. She is requesting new prescription for omeprazole.  Current Medications: Outpatient Encounter Medications as of 04/19/2019  Medication Sig  . AZO-CRANBERRY PO Take 2 tablets by mouth daily.   . bisoprolol-hydrochlorothiazide (ZIAC) 5-6.25 MG per tablet Take 1 tablet by mouth daily.  . Calcium Carbonate-Vit D-Min (CALCIUM 600+D PLUS MINERALS) 600-400 MG-UNIT TABS Take by mouth 2 (two) times daily.   . cyanocobalamin 500 MCG tablet Take 1,000 mcg by mouth daily.   . folic acid (FOLVITE) 1 MG tablet TAKE 1 TABLET BY MOUTH DAILY  . lisinopril (ZESTRIL) 10 MG tablet Take 10 mg by mouth daily.  Marland Kitchen omeprazole (PRILOSEC) 20 MG capsule Take 1 capsule (20 mg total) by mouth daily.  . psyllium (METAMUCIL) 58.6 % packet Take 1 packet by mouth daily.  . Simethicone (PHAZYME) 180 MG CAPS Take 1 capsule (180 mg total) by mouth every 6 (six) hours as needed.  . sulfaSALAzine (AZULFIDINE) 500 MG tablet Take 2 tablets (1,000 mg total) by mouth 3 (three) times daily.  . [DISCONTINUED] fluticasone (FLONASE) 50 MCG/ACT nasal spray Place 2 sprays into the  nose daily.  . [DISCONTINUED] RABEprazole (ACIPHEX) 20 MG tablet Take 1 tablet (20 mg total) by mouth daily. (Patient not taking: Reported on 04/19/2019)  . [DISCONTINUED] sucralfate (CARAFATE) 1 GM/10ML suspension Take 10 mLs (1 g total) by mouth 4 (four) times daily.   No facility-administered encounter medications on file as of 04/19/2019.      Objective: Blood pressure (!) 197/82, pulse 76, temperature 98 F (36.7 C), temperature source Oral, resp. rate 18, height 5' 5" (1.651 m), weight 138 lb 11.2 oz (62.9 kg). Patient is alert and in no acute distress. She has hearing impairment. Conjunctiva is pink. Sclera is nonicteric Oropharyngeal mucosa is normal. No neck masses or thyromegaly noted. Cardiac exam with regular rhythm normal S1 and S2.  She has grade 3/6 systolic murmur best heard at aortic area. Lungs are clear to auscultation. Abdomen is symmetrical soft and nontender with organomegaly or masses. She has mild nonpitting pretibial edema around her ankles.  No clubbing noted.  Labs/studies Results:   CBC Latest Ref Rng & Units 04/16/2018 04/08/2016 05/08/2015  WBC 3.4 - 10.8 x10E3/uL 5.9 4.6 4.3  Hemoglobin 11.1 - 15.9 g/dL 13.2 12.3 12.6  Hematocrit 34.0 - 46.6 % 39.6 37.1 39.1  Platelets 150 - 450 x10E3/uL 291 268 273    CMP Latest Ref Rng & Units 05/08/2015 11/07/2014  Creatinine 0.50 - 1.10 mg/dL 0.96 -  Total Protein 6.0 -  8.3 g/dL - 6.5  Total Bilirubin 0.2 - 1.2 mg/dL - 0.4  Alkaline Phos 39 - 117 U/L - 105  AST 0 - 37 U/L - 21  ALT 0 - 35 U/L - 12    Hepatic Function Latest Ref Rng & Units 11/07/2014  Total Protein 6.0 - 8.3 g/dL 6.5  Albumin 3.5 - 5.2 g/dL 4.1  AST 0 - 37 U/L 21  ALT 0 - 35 U/L 12  Alk Phosphatase 39 - 117 U/L 105  Total Bilirubin 0.2 - 1.2 mg/dL 0.4  Bilirubin, Direct 0.0 - 0.3 mg/dL 0.1      Assessment:  #1.  Chronic left-sided ulcerative colitis.  She had been on oral mesalamine for years but she was switched to sulfasalazine because of  cost issues.  She is also on folic acid.  She appears to be in remission.  #2.  Chronic GERD.  She is doing well with omeprazole.  Plan:  Request copy of recent blood work from Dr. Marcial Pacas office. New prescription for omeprazole 20 mg daily sent to patient's pharmacy 90 doses with 3 refills. Office visit in 1 year.

## 2019-04-19 NOTE — Patient Instructions (Signed)
Will request copy of recent blood work from Dr. Marcial Pacas office. Please call office if omeprazole does not work but he have diarrhea and rectal bleeding.

## 2019-05-09 ENCOUNTER — Encounter (INDEPENDENT_AMBULATORY_CARE_PROVIDER_SITE_OTHER): Payer: Self-pay

## 2019-05-11 ENCOUNTER — Telehealth (INDEPENDENT_AMBULATORY_CARE_PROVIDER_SITE_OTHER): Payer: Self-pay | Admitting: Internal Medicine

## 2019-05-11 NOTE — Telephone Encounter (Signed)
Please call patient regarding her medication - ph# [phone]

## 2019-07-19 ENCOUNTER — Encounter (INDEPENDENT_AMBULATORY_CARE_PROVIDER_SITE_OTHER): Payer: Self-pay

## 2019-07-21 ENCOUNTER — Other Ambulatory Visit (INDEPENDENT_AMBULATORY_CARE_PROVIDER_SITE_OTHER): Payer: Self-pay | Admitting: *Deleted

## 2019-07-21 DIAGNOSIS — K219 Gastro-esophageal reflux disease without esophagitis: Secondary | ICD-10-CM

## 2019-07-21 MED ORDER — OMEPRAZOLE 20 MG PO CPDR: 20.0000 mg | DELAYED_RELEASE_CAPSULE | Freq: Two times a day (BID) | ORAL | 3 refills | Status: DC

## 2019-07-21 NOTE — Telephone Encounter (Signed)
Patient sent a message in My chart, asking if she could take the Omeprazole 20 mg in the morning , and then another at supper time as needed. Carl Best authorized this and a new Rx is being sent to [facility].

## 2019-10-20 ENCOUNTER — Other Ambulatory Visit (INDEPENDENT_AMBULATORY_CARE_PROVIDER_SITE_OTHER): Payer: Self-pay | Admitting: *Deleted

## 2019-10-20 DIAGNOSIS — K219 Gastro-esophageal reflux disease without esophagitis: Secondary | ICD-10-CM

## 2019-10-20 DIAGNOSIS — K51211 Ulcerative (chronic) proctitis with rectal bleeding: Secondary | ICD-10-CM

## 2019-10-20 MED ORDER — OMEPRAZOLE 20 MG PO CPDR: 20.0000 mg | DELAYED_RELEASE_CAPSULE | Freq: Every day | ORAL | 3 refills | Status: DC

## 2019-10-20 MED ORDER — SULFASALAZINE 500 MG PO TABS: 1000.0000 mg | ORAL_TABLET | Freq: Three times a day (TID) | ORAL | 2 refills | Status: DC

## 2019-11-07 ENCOUNTER — Encounter (INDEPENDENT_AMBULATORY_CARE_PROVIDER_SITE_OTHER): Payer: Self-pay

## 2019-11-07 ENCOUNTER — Telehealth (INDEPENDENT_AMBULATORY_CARE_PROVIDER_SITE_OTHER): Payer: Self-pay | Admitting: Internal Medicine

## 2019-11-07 NOTE — Telephone Encounter (Signed)
Patient was called. I left a voicemail asking that she return call.

## 2019-11-07 NOTE — Telephone Encounter (Signed)
Patient left voice mail message stating she is having some abd pain thinks it could be gastritis - please call [phone]

## 2019-11-07 NOTE — Telephone Encounter (Signed)
Autumn Dean - I called left her a voicemail to call us back.

## 2019-11-08 ENCOUNTER — Encounter (INDEPENDENT_AMBULATORY_CARE_PROVIDER_SITE_OTHER): Payer: Self-pay

## 2019-11-10 ENCOUNTER — Other Ambulatory Visit: Payer: Self-pay

## 2019-11-10 ENCOUNTER — Encounter (INDEPENDENT_AMBULATORY_CARE_PROVIDER_SITE_OTHER): Payer: Self-pay | Admitting: Gastroenterology

## 2019-11-10 ENCOUNTER — Ambulatory Visit (INDEPENDENT_AMBULATORY_CARE_PROVIDER_SITE_OTHER): Payer: Medicare Other | Admitting: Gastroenterology

## 2019-11-10 VITALS — BP 179/73 | HR 82 | Temp 98.3°F | Wt 138.1 lb

## 2019-11-10 DIAGNOSIS — K5120 Ulcerative (chronic) proctitis without complications: Secondary | ICD-10-CM

## 2019-11-10 DIAGNOSIS — K219 Gastro-esophageal reflux disease without esophagitis: Secondary | ICD-10-CM

## 2019-11-10 MED ORDER — PANTOPRAZOLE SODIUM 40 MG PO TBEC: 40.0000 mg | DELAYED_RELEASE_TABLET | Freq: Every day | ORAL | 3 refills | Status: DC

## 2019-11-10 NOTE — Patient Instructions (Addendum)
We are starting pantoprazole 66m daily - this is for reflux and replaces omeprazole 288mdaily. Stop omeprazole 2017maily.   If you are not better in 2-3 weeks please call and would recommend an upper endoscopy for evaluation.

## 2019-11-10 NOTE — Progress Notes (Signed)
Patient profile: Autumn Dean is a 86 y.o. female seen for evaluation of GERD .  History of Present Illness: Autumn Dean is seen today for worsening GERD symptoms.  She reports she has been on Prilosec for approximately 20 years, about a month ago her reflux symptoms worsened and she developed some esophageal burning.  She also has a cough at night and wonders if this is related to GERD as well.  She reports that she is having volume regurgitation, she has tried sleeping with her head of bed elevated.  She remains upright after meals.  She does not have any dysphagia.  She reports her appetite is good.  She has tried to avoid spicy foods.  She uses Gas-X as needed for symptoms  She is not having any ulcerative colitis type symptoms has been on sulfasalazine per patient abt 40 years.  She is doing well.  Denies rectal or melena.  Having a stool daily. No lower abd pain.  Wt Readings from Last 3 Encounters:  11/10/19 138 lb 1.6 oz (62.6 kg)  04/19/19 138 lb 11.2 oz (62.9 kg)  10/19/18 143 lb 4.8 oz (65 kg)     Last Colonoscopy: 2013- Impression:  Examination performed to cecum. Active colitis involving distal sigmoid colon and rectum (multiple erosions and friable mucosa). Biopsies taken. No evidence of polyps or mass   Last Endoscopy: 2013-Impression: Mild changes of candida esophagitis. Brushing obtained for KOH prep. Small sliding hiatal hernia. Few small hyperplastic polyps in gastric body. These were left alone. No evidence of peptic ulcer disease or other gastric lesions   Past Medical History:  Past Medical History:  Diagnosis Date  . GERD (gastroesophageal reflux disease)   . Hiatal hernia   . Hypertension   . Proctitis   . UC (ulcerative colitis confined [facility] rectum) [facility])     Problem List: Patient Active Problem List   Diagnosis Date Noted  . Left sided ulcerative colitis ([place]) 04/19/2019  . Acute right-sided low back pain 04/16/2017  . Tricuspid  regurgitation 08/01/2014  . Mitral regurgitation 07/08/2013  . GERD (gastroesophageal reflux disease) 12/04/2011  . UC (ulcerative colitis confined to rectum) ([place]) 12/04/2011  . Hypertension 12/04/2011    Past Surgical History: Past Surgical History:  Procedure Laterality Date  . COLONOSCOPY    . COLONOSCOPY  08/18/2012   Procedure: COLONOSCOPY;  Surgeon: Rogene Houston, MD;  [facility];  Service: Endoscopy;  Laterality: N/A;  1200  . DILATION AND CURETTAGE OF UTERUS    . ESOPHAGOGASTRODUODENOSCOPY  12/18/2011   Procedure: ESOPHAGOGASTRODUODENOSCOPY (EGD);  Surgeon: Rogene Houston, MD;  [facility];  Service: Endoscopy;  Laterality: N/A;  200  . TONSILLECTOMY      Allergies: Allergies  Allergen Reactions  . Macrobid [Nitrofurantoin Macrocrystal] Other (See Comments)    Extreme dizziness  . Ciprofloxacin Nausea And Vomiting  . Codeine Rash  . Demerol Rash  . Latex Rash    Redness      Home Medications:  Current Outpatient Medications:  .  AZO-CRANBERRY PO, Take 2 tablets by mouth daily. , Disp: , Rfl:  .  bisoprolol-hydrochlorothiazide (ZIAC) 5-6.25 MG per tablet, Take 1 tablet by mouth daily., Disp: , Rfl:  .  Calcium Carbonate-Vit D-Min (CALCIUM 600+D PLUS MINERALS) 600-400 MG-UNIT TABS, Take by mouth 2 (two) times daily. , Disp: , Rfl:  .  cyanocobalamin 500 MCG tablet, Take 1,000 mcg by mouth daily. , Disp: , Rfl:  .  folic acid (  FOLVITE) 1 MG tablet, TAKE 1 TABLET BY MOUTH DAILY, Disp: 90 tablet, Rfl: 4 .  lisinopril (ZESTRIL) 10 MG tablet, Take 20 mg by mouth daily. , Disp: , Rfl:  .  omeprazole (PRILOSEC) 20 MG capsule, Take 1 capsule (20 mg total) by mouth daily., Disp: 270 capsule, Rfl: 3 .  psyllium (METAMUCIL) 58.6 % packet, Take 1 packet by mouth daily., Disp: , Rfl:  .  Simethicone (PHAZYME) 180 MG CAPS, Take 1 capsule (180 mg total) by mouth every 6 (six) hours as needed., Disp: 120 capsule, Rfl: 2 .  sulfaSALAzine (AZULFIDINE) 500 MG  tablet, Take 2 tablets (1,000 mg total) by mouth 3 (three) times daily., Disp: 540 tablet, Rfl: 2 .  pantoprazole (PROTONIX) 40 MG tablet, Take 1 tablet (40 mg total) by mouth daily. Take 30 min before breakfast., Disp: 30 tablet, Rfl: 3   Family History: Negative family GI history of colon polyps or colon cancer.    Social History:   reports that she has never smoked. She has never used smokeless tobacco. She reports that she does not drink alcohol or use drugs.   Review of Systems: Constitutional: Denies weight loss/weight gain  Eyes: No changes in vision. ENT: No oral lesions, sore throat.  GI: see HPI.  Heme/Lymph: No easy bruising.  CV: No chest pain.  GU: No hematuria.  Integumentary: No rashes.  Neuro: No headaches.  Psych: No depression/anxiety.  Endocrine: No heat/cold intolerance.  Allergic/Immunologic: No urticaria.  Resp: No cough, SOB.  Musculoskeletal: No joint swelling.    Physical Examination: BP (!) 179/73 (BP Location: Right Arm, Patient Position: Sitting, Cuff Size: Small)   Pulse 82   Temp 98.3 F (36.8 C) (Temporal)   Wt 138 lb 1.6 oz (62.6 kg)   HC 65" (165.1 cm)   BMI 22.98 kg/m  Gen: NAD, alert and oriented x 4 HEENT: PEERLA, EOMI, Neck: supple, no JVD Chest: CTA bilaterally, no wheezes, crackles, or other adventitious sounds CV: RRR, no m/g/c/r Abd: soft, NT, ND, +BS in all four quadrants; no HSM, guarding, ridigity, or rebound tenderness Ext: no edema, well perfused with 2+ pulses, Skin: no rash or lesions noted on observed skin Lymph: no noted LAD  Data Reviewed:  Copy of labs from PCP requested today.  No recent labs available in epic   Assessment/Plan: Autumn Dean is a 86 y.o. female seen for worsening GERD.  1.  GERD-she has been on omeprazole 20 mg daily for approximately 20 years.  We will try switching her to pantoprazole 40 mg once a day to see if will improve symptoms.  We discussed if she has not improved scheduling an upper  endoscopy for evaluation but she would like to hold off at this time.  She has volume regurgitation but no dysphagia, wt loss, UGI alarm symptoms.  She is on calcium vitamin D supplement.  2.  History of ulcerative colitis-asymptomatic on sulfasalazine, she is on folic acid supplement.  To notify us with any alarm type symptoms.  *if no better 2-3 weeks she will call and schedule EGD. Otherwise she will f/up at her next yearly OV.   Kosha was seen today for follow-up.  Diagnoses and all orders for this visit:  Chronic GERD  UC (ulcerative colitis confined to rectum) ([place])  Other orders -     pantoprazole (PROTONIX) 40 MG tablet; Take 1 tablet (40 mg total) by mouth daily. Take 30 min before breakfast.        I personally performed  the service, non-incident to. (WP)  Laurine Blazer, [facility]

## 2019-11-11 NOTE — Telephone Encounter (Signed)
[name], pls forward all [place] messages to [facility]. thx

## 2019-11-14 NOTE — Telephone Encounter (Signed)
Patient has been seen in our office , 11/10/2019.

## 2019-11-21 ENCOUNTER — Other Ambulatory Visit (INDEPENDENT_AMBULATORY_CARE_PROVIDER_SITE_OTHER): Payer: Self-pay | Admitting: *Deleted

## 2019-11-21 ENCOUNTER — Encounter (INDEPENDENT_AMBULATORY_CARE_PROVIDER_SITE_OTHER): Payer: Self-pay

## 2019-11-21 DIAGNOSIS — K219 Gastro-esophageal reflux disease without esophagitis: Secondary | ICD-10-CM

## 2019-11-21 MED ORDER — OMEPRAZOLE 20 MG PO CPDR: 20.0000 mg | DELAYED_RELEASE_CAPSULE | Freq: Two times a day (BID) | ORAL | 5 refills | Status: DC

## 2019-12-25 ENCOUNTER — Ambulatory Visit: Payer: Medicare PPO | Attending: Internal Medicine

## 2019-12-25 DIAGNOSIS — Z23 Encounter for immunization: Secondary | ICD-10-CM | POA: Insufficient documentation

## 2019-12-25 NOTE — Progress Notes (Signed)
   Covid-19 Vaccination Clinic  Name:  Autumn Dean    MRN: [id number] DOB: 09-24-1933  12/25/2019  Autumn Dean was observed post Covid-19 immunization for 30 minutes based on pre-vaccination screening without incidence. She was provided with Vaccine Information Sheet and instruction to access the V-Safe system.   Autumn Dean was instructed to call 911 with any severe reactions post vaccine: Marland Kitchen Difficulty breathing  . Swelling of your face and throat  . A fast heartbeat  . A bad rash all over your body  . Dizziness and weakness    Immunizations Administered    Name Date Dose VIS Date Route   Pfizer COVID-19 Vaccine 12/25/2019  2:12 PM 0.3 mL 10/14/2019 Intramuscular   Manufacturer: [place]   Lot: [id number]   [place]: [id number]

## 2020-01-18 ENCOUNTER — Ambulatory Visit: Payer: Medicare PPO | Attending: Internal Medicine

## 2020-01-18 DIAGNOSIS — Z23 Encounter for immunization: Secondary | ICD-10-CM

## 2020-01-18 NOTE — Progress Notes (Signed)
   Covid-19 Vaccination Clinic  Name:  KATHERYN CULLITON    MRN: [id number] DOB: 1933-10-02  01/18/2020  Ms. Espinal was observed post Covid-19 immunization for 15 minutes without incident. She was provided with Vaccine Information Sheet and instruction to access the V-Safe system.   Ms. Siebers was instructed to call 911 with any severe reactions post vaccine: Marland Kitchen Difficulty breathing  . Swelling of face and throat  . A fast heartbeat  . A bad rash all over body  . Dizziness and weakness   Immunizations Administered    Name Date Dose VIS Date Route   Pfizer COVID-19 Vaccine 01/18/2020  1:51 PM 0.3 mL 10/14/2019 Intramuscular   Manufacturer: [place]   Lot: [id number]   [place]: [id number]

## 2020-02-07 DIAGNOSIS — Z789 Other specified health status: Secondary | ICD-10-CM | POA: Diagnosis not present

## 2020-02-07 DIAGNOSIS — N390 Urinary tract infection, site not specified: Secondary | ICD-10-CM | POA: Diagnosis not present

## 2020-02-07 DIAGNOSIS — I10 Essential (primary) hypertension: Secondary | ICD-10-CM | POA: Diagnosis not present

## 2020-02-07 DIAGNOSIS — R350 Frequency of micturition: Secondary | ICD-10-CM | POA: Diagnosis not present

## 2020-02-07 DIAGNOSIS — Z299 Encounter for prophylactic measures, unspecified: Secondary | ICD-10-CM | POA: Diagnosis not present

## 2020-02-09 ENCOUNTER — Encounter (INDEPENDENT_AMBULATORY_CARE_PROVIDER_SITE_OTHER): Payer: Self-pay

## 2020-02-21 DIAGNOSIS — E785 Hyperlipidemia, unspecified: Secondary | ICD-10-CM | POA: Diagnosis not present

## 2020-02-21 DIAGNOSIS — Z79899 Other long term (current) drug therapy: Secondary | ICD-10-CM | POA: Diagnosis not present

## 2020-02-21 DIAGNOSIS — Z1211 Encounter for screening for malignant neoplasm of colon: Secondary | ICD-10-CM | POA: Diagnosis not present

## 2020-02-21 DIAGNOSIS — Z6824 Body mass index (BMI) 24.0-24.9, adult: Secondary | ICD-10-CM | POA: Diagnosis not present

## 2020-02-21 DIAGNOSIS — Z1331 Encounter for screening for depression: Secondary | ICD-10-CM | POA: Diagnosis not present

## 2020-02-21 DIAGNOSIS — E039 Hypothyroidism, unspecified: Secondary | ICD-10-CM | POA: Diagnosis not present

## 2020-02-21 DIAGNOSIS — Z0000 Encounter for general adult medical examination without abnormal findings: Secondary | ICD-10-CM | POA: Diagnosis not present

## 2020-02-21 DIAGNOSIS — R5383 Other fatigue: Secondary | ICD-10-CM | POA: Diagnosis not present

## 2020-02-21 DIAGNOSIS — Z299 Encounter for prophylactic measures, unspecified: Secondary | ICD-10-CM | POA: Diagnosis not present

## 2020-02-21 DIAGNOSIS — Z7189 Other specified counseling: Secondary | ICD-10-CM | POA: Diagnosis not present

## 2020-02-21 DIAGNOSIS — I10 Essential (primary) hypertension: Secondary | ICD-10-CM | POA: Diagnosis not present

## 2020-02-21 DIAGNOSIS — Z1339 Encounter for screening examination for other mental health and behavioral disorders: Secondary | ICD-10-CM | POA: Diagnosis not present

## 2020-03-02 DIAGNOSIS — I10 Essential (primary) hypertension: Secondary | ICD-10-CM | POA: Diagnosis not present

## 2020-04-01 DIAGNOSIS — I10 Essential (primary) hypertension: Secondary | ICD-10-CM | POA: Diagnosis not present

## 2020-04-24 ENCOUNTER — Other Ambulatory Visit: Payer: Self-pay

## 2020-04-24 ENCOUNTER — Ambulatory Visit (INDEPENDENT_AMBULATORY_CARE_PROVIDER_SITE_OTHER): Payer: Medicare PPO | Admitting: Internal Medicine

## 2020-04-24 DIAGNOSIS — K51211 Ulcerative (chronic) proctitis with rectal bleeding: Secondary | ICD-10-CM | POA: Diagnosis not present

## 2020-04-24 DIAGNOSIS — K5150 Left sided colitis without complications: Secondary | ICD-10-CM

## 2020-04-24 DIAGNOSIS — K219 Gastro-esophageal reflux disease without esophagitis: Secondary | ICD-10-CM | POA: Diagnosis not present

## 2020-04-24 MED ORDER — MESALAMINE 400 MG PO CPDR: 800.0000 mg | DELAYED_RELEASE_CAPSULE | Freq: Two times a day (BID) | ORAL | 5 refills | Status: DC

## 2020-04-24 MED ORDER — SULFASALAZINE 500 MG PO TABS: 500.0000 mg | ORAL_TABLET | Freq: Three times a day (TID) | ORAL | 3 refills | Status: DC

## 2020-04-24 NOTE — Progress Notes (Signed)
Presenting complaint;  Follow-up for GERD and ulcerative colitis.  Database and subjective:  Patient is 86 year old Caucasian female with history of distal ulcerative colitis as well as chronic GERD who is here for scheduled visit.  She was last seen on 11/10/2019 for GERD symptoms not responding to omeprazole which she had been taking 20 mg twice daily.  She was switched to pantoprazole. Patient states pantoprazole is not working either.  She has gone back to omeprazole.  She burps and regurgitates after each meal.  She has daily heartburn.  She did experience drop in her appetite about 2 months ago but appetite is back to normal.  She is only lost 2 pounds since her last visit.  She feels UC is in remission she has 1-2 formed stools daily.  She denies abdominal cramping melena or rectal bleeding. Review of the systems is negative for dysphagia chronic cough sore throat or hoarseness.  Current Medications: Outpatient Encounter Medications as of 04/24/2020  Medication Sig  . AZO-CRANBERRY PO Take 2 tablets by mouth daily.   . bisoprolol-hydrochlorothiazide (ZIAC) 5-6.25 MG per tablet Take 1 tablet by mouth daily.  . Calcium Carbonate-Vit D-Min (CALCIUM 600+D PLUS MINERALS) 600-400 MG-UNIT TABS Take by mouth 2 (two) times daily.   . cyanocobalamin 500 MCG tablet Take 1,000 mcg by mouth daily.   . folic acid (FOLVITE) 1 MG tablet TAKE 1 TABLET BY MOUTH DAILY  . lisinopril (ZESTRIL) 10 MG tablet Take 20 mg by mouth daily.   Marland Kitchen omeprazole (PRILOSEC) 20 MG capsule Take 1 capsule (20 mg total) by mouth 2 (two) times daily.  . psyllium (METAMUCIL) 58.6 % packet Take 1 packet by mouth daily.  . Simethicone (PHAZYME) 180 MG CAPS Take 1 capsule (180 mg total) by mouth every 6 (six) hours as needed.  . sulfaSALAzine (AZULFIDINE) 500 MG tablet Take 2 tablets (1,000 mg total) by mouth 3 (three) times daily.  . pantoprazole (PROTONIX) 40 MG tablet Take 1 tablet (40 mg total) by mouth daily. Take 30 min before  breakfast. (Patient not taking: Reported on 04/24/2020)  . [DISCONTINUED] fluticasone (FLONASE) 50 MCG/ACT nasal spray Place 2 sprays into the nose daily.  . [DISCONTINUED] sucralfate (CARAFATE) 1 GM/10ML suspension Take 10 mLs (1 g total) by mouth 4 (four) times daily.   No facility-administered encounter medications on file as of 04/24/2020.     Objective: BP (!) 179/73  Pulse 82   Temp 98.3 F (36.8 C) (Temporal)   Wt 136 lb 1 (62 kg)   HC 65" (165.1 cm)   BMI 22.90 kg/m  Patient is alert and in no acute distress. She has hearing impairment. She is wearing a mask. Conjunctiva is pink. Sclera is nonicteric Oropharyngeal mucosa is normal. No neck masses or thyromegaly noted. Cardiac exam with regular rhythm normal S1 and S2.  She has grade 3 or 6 systolic murmur best heard at aortic area. Lungs are clear to auscultation. Abdomen is symmetrical soft and nontender with no organomegaly or masses. No LE edema or clubbing noted.   Assessment:  #1.  Ulcerative colitis.  Patient had been on oral mesalamine for years but she was switched to sulfasalazine per demand from her insurance company.  She remains in remission on this medication.  But she may be having GI side effects.  #2.  Poorly controlled GERD symptoms.  I suspect symptoms are secondary sulfasalazine which needs to be discontinued.   Plan:  Discontinue sulfasalazine and folic acid. Begin Delzicol 800 mg by mouth twice  daily. Continue omeprazole at current dose of 20 mg p.o. twice daily. Patient will call with progress report in in 1 week or so. If GERD symptoms do not improve on stopping sulfasalazine will consider esophagogastroduodenoscopy. Office visit in 6 months.

## 2020-04-24 NOTE — Patient Instructions (Signed)
Please call office with progress report in 1 week or earlier if Delzicol/mesalamine not covered by your insurance or if co-pay is too high.

## 2020-04-26 ENCOUNTER — Other Ambulatory Visit: Payer: Self-pay

## 2020-04-26 ENCOUNTER — Encounter: Payer: Self-pay | Admitting: Orthopaedic Surgery

## 2020-04-26 ENCOUNTER — Encounter (INDEPENDENT_AMBULATORY_CARE_PROVIDER_SITE_OTHER): Payer: Self-pay | Admitting: Internal Medicine

## 2020-04-26 ENCOUNTER — Ambulatory Visit (INDEPENDENT_AMBULATORY_CARE_PROVIDER_SITE_OTHER): Payer: Medicare PPO

## 2020-04-26 ENCOUNTER — Ambulatory Visit (INDEPENDENT_AMBULATORY_CARE_PROVIDER_SITE_OTHER): Payer: Medicare PPO | Admitting: Orthopaedic Surgery

## 2020-04-26 ENCOUNTER — Telehealth: Payer: Self-pay

## 2020-04-26 VITALS — BP 159/89 | HR 75 | Ht 64.0 in | Wt 138.0 lb

## 2020-04-26 DIAGNOSIS — M545 Low back pain: Secondary | ICD-10-CM | POA: Diagnosis not present

## 2020-04-26 DIAGNOSIS — M25551 Pain in right hip: Secondary | ICD-10-CM

## 2020-04-26 DIAGNOSIS — G8929 Other chronic pain: Secondary | ICD-10-CM | POA: Diagnosis not present

## 2020-04-26 NOTE — Progress Notes (Signed)
Office Visit Note   Patient: Autumn Dean           Date of Birth: May 06, 1933           MRN: [id number] Visit Date: 04/26/2020              Requested by: Glenda Chroman, MD [place],  [place] [address] PCP: Glenda Chroman, MD   Assessment & Plan: Visit Diagnoses:  1. Chronic right-sided low back pain, unspecified whether sciatica present   2. Pain in right hip     Plan: Patient has progressive anterolisthesis at L4-5 with likely some progression of right lateral recess stenosis/foraminal stenosis consistent with her symptoms.  Currently she gets some relief when she sits down.  As long as she can maintain ambulation daily we will continue following observation.  She gets progressive symptoms will obtain lumbar MRI scan.  We plan to recheck her in 6 months.  Follow-Up Instructions: No follow-ups on file.   Orders:  Orders Placed This Encounter  Procedures  . XR Lumbar Spine 2-3 Views  . XR HIP UNILAT W OR W/O PELVIS 2-3 VIEWS RIGHT   No orders of the defined types were placed in this encounter.     Procedures: No procedures performed   Clinical Data: No additional findings.   Subjective: Chief Complaint  Patient presents with  . Lower Back - Pain  . Right Hip - Pain    HPI 86 year old female returns with problems with bursitis in her right hip.  She has some pain in her buttocks radiates into her thigh is worse when she stands or when she walks.  She had been seen 2018 where x-rays showed grade 1/2 anterolisthesis at L4-5 degenerative in nature.  She had a recent injection in her buttocks with some cortisone which has helped some.  She is worn a brace she got from [place] and has been ambulatory with a cane when she goes out which she did not previously do.  She had one fall and she is concerned and wants to avoid fracturing her hip.  Does have a history of osteopia.  Review of Systems positive history of ulcerative colitis GERD mitral  regurgitation and hypertension.  Positive for osteoporosis.  Otherwise negative as pertains HPI.   Objective: Vital Signs: BP (!) 159/89   Pulse 75   Ht 5' 4"  (1.626 m)   Wt 138 lb (62.6 kg)   BMI 23.69 kg/m   Physical Exam Constitutional:      Appearance: She is well-developed.  HENT:     Head: Normocephalic.     Right Ear: External ear normal.     Left Ear: External ear normal.  Eyes:     Pupils: Pupils are equal, round, and reactive to light.  Neck:     Thyroid: No thyromegaly.     Trachea: No tracheal deviation.  Cardiovascular:     Rate and Rhythm: Normal rate.  Pulmonary:     Effort: Pulmonary effort is normal.  Abdominal:     Palpations: Abdomen is soft.  Skin:    General: Skin is warm and dry.  Neurological:     Mental Status: She is alert and oriented to person, place, and time.  Psychiatric:        Behavior: Behavior normal.     Ortho Exam patient has mild pitting edema right and left lower extremities.  She has TED hose on.  Negative logroll to the hips right and left  minimal trochanteric bursal tenderness.  She has some sciatic notch tenderness on the right negative straight leg raising 90 degrees.  Normal heel toe gait.  She can ambulate with without the cane. Specialty Comments:  No specialty comments available.  Imaging: No results found.   PMFS History: Patient Active Problem List   Diagnosis Date Noted  . Left sided ulcerative colitis ([place]) 04/19/2019  . Acute right-sided low back pain 04/16/2017  . Tricuspid regurgitation 08/01/2014  . Mitral regurgitation 07/08/2013  . GERD (gastroesophageal reflux disease) 12/04/2011  . UC (ulcerative colitis confined to rectum) ([place]) 12/04/2011  . Hypertension 12/04/2011   Past Medical History:  Diagnosis Date  . GERD (gastroesophageal reflux disease)   . Hiatal hernia   . Hypertension   . Proctitis   . UC (ulcerative colitis confined to rectum) [facility])     Family History  Problem Relation Age of  Onset  . Colon cancer Neg Hx     Past Surgical History:  Procedure Laterality Date  . COLONOSCOPY    . COLONOSCOPY  08/18/2012   Procedure: COLONOSCOPY;  Surgeon: Rogene Houston, MD;  [facility];  Service: Endoscopy;  Laterality: N/A;  1200  . DILATION AND CURETTAGE OF UTERUS    . ESOPHAGOGASTRODUODENOSCOPY  12/18/2011   Procedure: ESOPHAGOGASTRODUODENOSCOPY (EGD);  Surgeon: Rogene Houston, MD;  Location[facility];  Service: Endoscopy;  Laterality: N/A;  200  . TONSILLECTOMY     Social History   Occupational History  . Not on file  Tobacco Use  . Smoking status: Never Smoker  . Smokeless tobacco: Never Used  Vaping Use  . Vaping Use: Never used  Substance and Sexual Activity  . Alcohol use: No    Alcohol/week: 0.0 standard drinks  . Drug use: No  . Sexual activity: Not on file

## 2020-04-27 NOTE — Telephone Encounter (Signed)
Pt called and all questions concerning appt were answered.

## 2020-05-02 DIAGNOSIS — I10 Essential (primary) hypertension: Secondary | ICD-10-CM | POA: Diagnosis not present

## 2020-05-09 ENCOUNTER — Ambulatory Visit: Payer: Medicare PPO | Admitting: Urology

## 2020-05-10 ENCOUNTER — Other Ambulatory Visit (INDEPENDENT_AMBULATORY_CARE_PROVIDER_SITE_OTHER): Payer: Self-pay | Admitting: Internal Medicine

## 2020-05-10 DIAGNOSIS — K219 Gastro-esophageal reflux disease without esophagitis: Secondary | ICD-10-CM

## 2020-05-23 DIAGNOSIS — R300 Dysuria: Secondary | ICD-10-CM | POA: Diagnosis not present

## 2020-05-23 DIAGNOSIS — Z299 Encounter for prophylactic measures, unspecified: Secondary | ICD-10-CM | POA: Diagnosis not present

## 2020-05-23 DIAGNOSIS — I272 Pulmonary hypertension, unspecified: Secondary | ICD-10-CM | POA: Diagnosis not present

## 2020-05-23 DIAGNOSIS — N390 Urinary tract infection, site not specified: Secondary | ICD-10-CM | POA: Diagnosis not present

## 2020-05-23 DIAGNOSIS — I10 Essential (primary) hypertension: Secondary | ICD-10-CM | POA: Diagnosis not present

## 2020-06-01 DIAGNOSIS — I10 Essential (primary) hypertension: Secondary | ICD-10-CM | POA: Diagnosis not present

## 2020-06-04 ENCOUNTER — Other Ambulatory Visit (INDEPENDENT_AMBULATORY_CARE_PROVIDER_SITE_OTHER): Payer: Self-pay | Admitting: Internal Medicine

## 2020-06-13 ENCOUNTER — Ambulatory Visit: Payer: Medicare PPO | Admitting: Urology

## 2020-06-25 DIAGNOSIS — R011 Cardiac murmur, unspecified: Secondary | ICD-10-CM | POA: Diagnosis not present

## 2020-06-29 ENCOUNTER — Ambulatory Visit (INDEPENDENT_AMBULATORY_CARE_PROVIDER_SITE_OTHER): Payer: Medicare PPO | Admitting: Urology

## 2020-06-29 ENCOUNTER — Encounter: Payer: Self-pay | Admitting: Urology

## 2020-06-29 ENCOUNTER — Other Ambulatory Visit: Payer: Self-pay

## 2020-06-29 VITALS — BP 181/92 | HR 81 | Temp 98.3°F | Ht 64.0 in | Wt 138.0 lb

## 2020-06-29 DIAGNOSIS — N3020 Other chronic cystitis without hematuria: Secondary | ICD-10-CM

## 2020-06-29 DIAGNOSIS — N3000 Acute cystitis without hematuria: Secondary | ICD-10-CM | POA: Diagnosis not present

## 2020-06-29 LAB — MICROSCOPIC EXAMINATION: Renal Epithel, UA: NONE SEEN /hpf

## 2020-06-29 LAB — URINALYSIS, ROUTINE W REFLEX MICROSCOPIC
Bilirubin, UA: NEGATIVE
Glucose, UA: NEGATIVE
Ketones, UA: NEGATIVE
Leukocytes,UA: NEGATIVE
Nitrite, UA: NEGATIVE
Protein,UA: NEGATIVE
Specific Gravity, UA: 1.015 (ref 1.005–1.030)
Urobilinogen, Ur: 0.2 mg/dL (ref 0.2–1.0)
pH, UA: 5.5 (ref 5.0–7.5)

## 2020-06-29 LAB — BLADDER SCAN AMB NON-IMAGING: Scan Result: 48.4

## 2020-06-29 MED ORDER — METHENAMINE HIPPURATE 1 G PO TABS: 1.0000 g | ORAL_TABLET | Freq: Every day | ORAL | 11 refills | Status: DC

## 2020-06-29 NOTE — Progress Notes (Signed)
06/29/2020 1:44 PM   Bucks 08-10-1933 [id number]  Referring provider: Glenda Chroman, MD [place],   [id number]  Recurrent UTI  HPI: Autumn Dean is a 86yo here for follouwp for recurrent UTIs. She was previously seen by Dr. Tresa Moore at [place]. She currently uses estrace cream every day. Since she was last seen she has had 4 documented UTIs. She has allergy to macrobid.  She denies any hematuria or dysuria. She denies any urinary frequency or urgency. She has nocturia 2x.   Here records from [place] are as follows: 1 - Recurrent Cystitis - Few episdoes per year of simple cystitis. Symptoms irritative voidign that improve after treatment. She has h/o C.Diff. Mopst recent CX 05/2018 on eval irritative voiding. Res Amp but sens keflex, bactrim, nitro. NO febrile pyelo hospitalizations. CT 2019 w/o stones or hydro.   Now manages with self-start macrobid 05/2018.   3 - Atrophic Vaginitis - post-menaupasal not on estrogens. Severe atrophic changes by pelvic 05/2018. NO h/o estrogen sensitive malignancy. STarting twice weekly estrace 05/2018.   4 - Vaginal Candidiasis - occasional candidiasis that quickly responds to PO meds. Non-diabetic. Exam 2020 w/o current infection.   PMH sig for hard of hearing, valvular heart disease (asymptomatic), ulcerative colitis (remission x years), prior C. Diff colitis. She is retired Therapist, sports. Her PCP is Jerene Bears MD with [facility].   Today " Autumn Dean " is seen in f/u above. NO interval febrile UTI / hospitalizations. She remains on estrace. She wants to r/o yeast infection today.     PMH: Past Medical History:  Diagnosis Date  . GERD (gastroesophageal reflux disease)   . Hiatal hernia   . Hypertension   . Proctitis   . UC (ulcerative colitis confined to rectum) [facility])     Surgical History: Past Surgical History:  Procedure Laterality Date  . COLONOSCOPY    . COLONOSCOPY  08/18/2012   Procedure: COLONOSCOPY;  Surgeon: Rogene Houston, MD;  [facility];  Service: Endoscopy;  Laterality: N/A;  1200  . DILATION AND CURETTAGE OF UTERUS    . ESOPHAGOGASTRODUODENOSCOPY  12/18/2011   Procedure: ESOPHAGOGASTRODUODENOSCOPY (EGD);  Surgeon: Rogene Houston, MD;  [facility];  Service: Endoscopy;  Laterality: N/A;  200  . TONSILLECTOMY      Home Medications:  Allergies as of 06/29/2020      Reactions   Macrobid [nitrofurantoin Macrocrystal] Other (See Comments)   Extreme dizziness   Ciprofloxacin Nausea And Vomiting   Nitrofurantoin Nausea And Vomiting   Codeine Rash, Itching   Demerol Rash   Latex Rash   Redness      Medication List       Accurate as of June 29, 2020  1:44 PM. If you have any questions, ask your nurse or doctor.        AZO-CRANBERRY PO Take 2 tablets by mouth daily.   bisoprolol-hydrochlorothiazide 5-6.25 MG tablet Commonly known as: ZIAC Take 1 tablet by mouth daily.   Calcium 600+D Plus Minerals 600-400 MG-UNIT Tabs Take by mouth 2 (two) times daily.   folic acid 1 MG tablet Commonly known as: FOLVITE TAKE 1 TABLET BY MOUTH DAILY   lisinopril 10 MG tablet Commonly known as: ZESTRIL Take 20 mg by mouth daily.   Mesalamine 400 MG Cpdr DR capsule Commonly known as: ASACOL Take 2 capsules (800 mg total) by mouth 2 (two) times daily.   omeprazole 20 MG capsule Commonly known as: PRILOSEC TAKE  ONE CAPSULE BY MOUTH TWICE DAILY   psyllium 58.6 % packet Commonly known as: METAMUCIL Take 1 packet by mouth daily.   Simethicone 180 MG Caps Commonly known as: Phazyme Take 1 capsule (180 mg total) by mouth every 6 (six) hours as needed.   sulfaSALAzine 500 MG tablet Commonly known as: AZULFIDINE Take 500 mg by mouth 4 (four) times daily.   vitamin B-12 500 MCG tablet Commonly known as: CYANOCOBALAMIN Take 1,000 mcg by mouth daily.       Allergies:  Allergies  Allergen Reactions  . Macrobid [Nitrofurantoin Macrocrystal] Other (See Comments)     Extreme dizziness  . Ciprofloxacin Nausea And Vomiting  . Nitrofurantoin Nausea And Vomiting  . Codeine Rash and Itching  . Demerol Rash  . Latex Rash    Redness    Family History: Family History  Problem Relation Age of Onset  . Colon cancer Neg Hx     Social History:  reports that she has never smoked. She has never used smokeless tobacco. She reports that she does not drink alcohol and does not use drugs.  ROS: All other review of systems were reviewed and are negative except what is noted above in HPI  Physical Exam: BP (!) 181/92   Pulse 81   Temp 98.3 F (36.8 C)   Ht 5' 4"  (1.626 m)   Wt 138 lb (62.6 kg)   BMI 23.69 kg/m   Constitutional:  Alert and oriented, No acute distress. HEENT: [place] AT, moist mucus membranes.  Trachea midline, no masses. Cardiovascular: No clubbing, cyanosis, or edema. Respiratory: Normal respiratory effort, no increased work of breathing. GI: Abdomen is soft, nontender, nondistended, no abdominal masses GU: No CVA tenderness.  Lymph: No cervical or inguinal lymphadenopathy. Skin: No rashes, bruises or suspicious lesions. Neurologic: Grossly intact, no focal deficits, moving all 4 extremities. Psychiatric: Normal mood and affect.  Laboratory Data: Lab Results  Component Value Date   WBC 5.9 04/16/2018   HGB 13.2 04/16/2018   HCT 39.6 04/16/2018   MCV 88 04/16/2018   PLT 291 04/16/2018    Lab Results  Component Value Date   CREATININE 0.96 05/08/2015    No results found for: PSA  No results found for: TESTOSTERONE  No results found for: HGBA1C  Urinalysis    Component Value Date/Time   COLORURINE YELLOW 05/08/2015 1505   APPEARANCEUR CLEAR 05/08/2015 1505   LABSPEC 1.008 05/08/2015 1505   PHURINE 5.0 05/08/2015 1505   GLUCOSEU NEG 05/08/2015 1505   HGBUR TRACE (A) 05/08/2015 1505   BILIRUBINUR NEG 05/08/2015 1505   KETONESUR NEG 05/08/2015 1505   PROTEINUR NEG 05/08/2015 1505   UROBILINOGEN 0.2 05/08/2015 1505    NITRITE NEG 05/08/2015 1505   LEUKOCYTESUR NEG 05/08/2015 1505    No results found for: LABMICR, WBCUA, RBCUA, LABEPIT, MUCUS, BACTERIA  Pertinent Imaging:  No results found for this or any previous visit.  No results found for this or any previous visit.  No results found for this or any previous visit.  No results found for this or any previous visit.  No results found for this or any previous visit.  No results found for this or any previous visit.  No results found for this or any previous visit.  No results found for this or any previous visit.   Assessment & Plan:    1. Chronic cystitis -Urine for culture, will call with results -hiprex 1g daily - Urinalysis, Routine w reflex microscopic   No follow-ups on  file.  Nicolette Bang, MD  [facility] [place]

## 2020-06-29 NOTE — Patient Instructions (Signed)
Urinary Tract Infection, Adult A urinary tract infection (UTI) is an infection of any part of the urinary tract. The urinary tract includes:  The kidneys.  The ureters.  The bladder.  The urethra. These organs make, store, and get rid of pee (urine) in the body. What are the causes? This is caused by germs (bacteria) in your genital area. These germs grow and cause swelling (inflammation) of your urinary tract. What increases the risk? You are more likely to develop this condition if:  You have a small, thin tube (catheter) to drain pee.  You cannot control when you pee or poop (incontinence).  You are female, and: ? You use these methods to prevent pregnancy:  A medicine that kills sperm (spermicide).  A device that blocks sperm (diaphragm). ? You have low levels of a female hormone (estrogen). ? You are pregnant.  You have genes that add to your risk.  You are sexually active.  You take antibiotic medicines.  You have trouble peeing because of: ? A prostate that is bigger than normal, if you are female. ? A blockage in the part of your body that drains pee from the bladder (urethra). ? A kidney stone. ? A nerve condition that affects your bladder (neurogenic bladder). ? Not getting enough to drink. ? Not peeing often enough.  You have other conditions, such as: ? Diabetes. ? A weak disease-fighting system (immune system). ? Sickle cell disease. ? Gout. ? Injury of the spine. What are the signs or symptoms? Symptoms of this condition include:  Needing to pee right away (urgently).  Peeing often.  Peeing small amounts often.  Pain or burning when peeing.  Blood in the pee.  Pee that smells bad or not like normal.  Trouble peeing.  Pee that is cloudy.  Fluid coming from the vagina, if you are female.  Pain in the belly or lower back. Other symptoms include:  Throwing up (vomiting).  No urge to eat.  Feeling mixed up (confused).  Being tired  and grouchy (irritable).  A fever.  Watery poop (diarrhea). How is this treated? This condition may be treated with:  Antibiotic medicine.  Other medicines.  Drinking enough water. Follow these instructions at home:  Medicines  Take over-the-counter and prescription medicines only as told by your doctor.  If you were prescribed an antibiotic medicine, take it as told by your doctor. Do not stop taking it even if you start to feel better. General instructions  Make sure you: ? Pee until your bladder is empty. ? Do not hold pee for a long time. ? Empty your bladder after sex. ? Wipe from front to back after pooping if you are a female. Use each tissue one time when you wipe.  Drink enough fluid to keep your pee pale yellow.  Keep all follow-up visits as told by your doctor. This is important. Contact a doctor if:  You do not get better after 1-2 days.  Your symptoms go away and then come back. Get help right away if:  You have very bad back pain.  You have very bad pain in your lower belly.  You have a fever.  You are sick to your stomach (nauseous).  You are throwing up. Summary  A urinary tract infection (UTI) is an infection of any part of the urinary tract.  This condition is caused by germs in your genital area.  There are many risk factors for a UTI. These include having a small, thin   tube to drain pee and not being able to control when you pee or poop.  Treatment includes antibiotic medicines for germs.  Drink enough fluid to keep your pee pale yellow. This information is not intended to replace advice given to you by your health care provider. Make sure you discuss any questions you have with your health care provider. Document Revised: 10/07/2018 Document Reviewed: 04/29/2018 Elsevier Patient Education  2020 [employer] Inc[employer]  

## 2020-06-29 NOTE — Progress Notes (Signed)
Urological Symptom Review  Patient is experiencing the following symptoms: None   Review of Systems  Gastrointestinal (upper)  : Negative for upper GI symptoms  Gastrointestinal (lower) : Negative for lower GI symptoms  Constitutional : Negative for symptoms  Skin: Negative for skin symptoms  Eyes: Negative for eye symptoms  Ear/Nose/Throat : Negative for Ear/Nose/Throat symptoms  Hematologic/Lymphatic: Negative for Hematologic/Lymphatic symptoms  Cardiovascular : Negative for cardiovascular symptoms  Respiratory : Negative for respiratory symptoms  Endocrine: Negative for endocrine symptoms  Musculoskeletal: Negative for musculoskeletal symptoms  Neurological: Negative for neurological symptoms  Psychologic: Negative for psychiatric symptoms

## 2020-07-01 LAB — URINE CULTURE

## 2020-07-06 DIAGNOSIS — I272 Pulmonary hypertension, unspecified: Secondary | ICD-10-CM | POA: Diagnosis not present

## 2020-07-06 DIAGNOSIS — I10 Essential (primary) hypertension: Secondary | ICD-10-CM | POA: Diagnosis not present

## 2020-07-06 DIAGNOSIS — I340 Nonrheumatic mitral (valve) insufficiency: Secondary | ICD-10-CM | POA: Diagnosis not present

## 2020-07-06 DIAGNOSIS — E039 Hypothyroidism, unspecified: Secondary | ICD-10-CM | POA: Diagnosis not present

## 2020-07-06 DIAGNOSIS — Z299 Encounter for prophylactic measures, unspecified: Secondary | ICD-10-CM | POA: Diagnosis not present

## 2020-08-22 ENCOUNTER — Encounter: Payer: Self-pay | Admitting: Urology

## 2020-08-23 ENCOUNTER — Ambulatory Visit: Payer: Medicare PPO | Attending: Internal Medicine

## 2020-08-23 DIAGNOSIS — Z23 Encounter for immunization: Secondary | ICD-10-CM

## 2020-08-23 NOTE — Progress Notes (Signed)
   Covid-19 Vaccination Clinic  Name:  Autumn Dean    MRN: [id number] DOB: 1933-04-10  08/23/2020  [name] was observed post Covid-19 immunization for 15 minutes without incident. She was provided with Vaccine Information Sheet and instruction to access the V-Safe system.   [name] was instructed to call [phone] with any severe reactions post vaccine: Marland Kitchen Difficulty breathing  . Swelling of face and throat  . A fast heartbeat  . A bad rash all over body  . Dizziness and weakness

## 2020-09-04 ENCOUNTER — Telehealth: Payer: Self-pay

## 2020-09-04 DIAGNOSIS — I10 Essential (primary) hypertension: Secondary | ICD-10-CM | POA: Diagnosis not present

## 2020-09-04 DIAGNOSIS — Z299 Encounter for prophylactic measures, unspecified: Secondary | ICD-10-CM | POA: Diagnosis not present

## 2020-09-04 DIAGNOSIS — E039 Hypothyroidism, unspecified: Secondary | ICD-10-CM | POA: Diagnosis not present

## 2020-09-04 DIAGNOSIS — I272 Pulmonary hypertension, unspecified: Secondary | ICD-10-CM | POA: Diagnosis not present

## 2020-09-04 MED ORDER — TRIMETHOPRIM 100 MG PO TABS
100.0000 mg | ORAL_TABLET | Freq: Every evening | ORAL | 11 refills | Status: DC
Start: 2020-09-04 — End: 2020-10-05

## 2020-09-04 NOTE — Telephone Encounter (Signed)
Pt notified of new rx sent in and to stop taking the Hiprex.

## 2020-10-05 ENCOUNTER — Encounter: Payer: Self-pay | Admitting: Urology

## 2020-10-05 ENCOUNTER — Other Ambulatory Visit: Payer: Self-pay

## 2020-10-05 ENCOUNTER — Ambulatory Visit (INDEPENDENT_AMBULATORY_CARE_PROVIDER_SITE_OTHER): Payer: Medicare PPO | Admitting: Urology

## 2020-10-05 VITALS — BP 181/87 | HR 83 | Temp 98.6°F | Ht 64.0 in | Wt 138.0 lb

## 2020-10-05 DIAGNOSIS — N3020 Other chronic cystitis without hematuria: Secondary | ICD-10-CM | POA: Diagnosis not present

## 2020-10-05 LAB — URINALYSIS, ROUTINE W REFLEX MICROSCOPIC
Bilirubin, UA: NEGATIVE
Glucose, UA: NEGATIVE
Ketones, UA: NEGATIVE
Leukocytes,UA: NEGATIVE
Nitrite, UA: NEGATIVE
Protein,UA: NEGATIVE
Specific Gravity, UA: 1.01 (ref 1.005–1.030)
Urobilinogen, Ur: 0.2 mg/dL (ref 0.2–1.0)
pH, UA: 5.5 (ref 5.0–7.5)

## 2020-10-05 LAB — MICROSCOPIC EXAMINATION
Bacteria, UA: NONE SEEN
Renal Epithel, UA: NONE SEEN /hpf

## 2020-10-05 LAB — BLADDER SCAN AMB NON-IMAGING: Scan Result: 6

## 2020-10-05 MED ORDER — TRIMETHOPRIM 100 MG PO TABS
100.0000 mg | ORAL_TABLET | Freq: Every evening | ORAL | 3 refills | Status: DC
Start: 2020-10-05 — End: 2021-12-16

## 2020-10-05 NOTE — Progress Notes (Signed)
10/05/2020 1:53 PM   [place] 1933-07-14 [id number]  Referring provider: Glenda Chroman, MD [place],  [place] [address]  followup chronic UTI  HPI: Ms Autumn Dean is a 86yo here for followup for recurrent UTI. UA today is normal. No UTI since last visit. She is on trimethoprim 174m qhs. No significant LUTS. No dysuria or hematuria. She cannot take macrobid and hiprex did not work to prevent the UTIs.    PMH: Past Medical History:  Diagnosis Date  . GERD (gastroesophageal reflux disease)   . Hiatal hernia   . Hypertension   . Proctitis   . UC (ulcerative colitis confined to rectum) ([facility]     Surgical History: Past Surgical History:  Procedure Laterality Date  . COLONOSCOPY    . COLONOSCOPY  08/18/2012   Procedure: COLONOSCOPY;  Surgeon: NRogene Houston MD;  [facility];  Service: Endoscopy;  Laterality: N/A;  1200  . DILATION AND CURETTAGE OF UTERUS    . ESOPHAGOGASTRODUODENOSCOPY  12/18/2011   Procedure: ESOPHAGOGASTRODUODENOSCOPY (EGD);  Surgeon: NRogene Houston MD;  Location: [facility];  Service: Endoscopy;  Laterality: N/A;  200  . TONSILLECTOMY      Home Medications:  Allergies as of 10/05/2020      Reactions   Macrobid [nitrofurantoin Macrocrystal] Other (See Comments)   Extreme dizziness   Ciprofloxacin Nausea And Vomiting   Nitrofurantoin Nausea And Vomiting   Codeine Rash, Itching   Demerol Rash   Latex Rash   Redness      Medication List       Accurate as of October 05, 2020  1:53 PM. If you have any questions, ask your nurse or doctor.        AZO-CRANBERRY PO Take 2 tablets by mouth daily.   bisoprolol-hydrochlorothiazide 5-6.25 MG tablet Commonly known as: ZIAC Take 1 tablet by mouth daily.   Calcium 600+D Plus Minerals 600-400 MG-UNIT Tabs Take by mouth 2 (two) times daily.   estradiol 0.1 MG/GM vaginal cream Commonly known as: ESTRACE APPLY TO THE AFFECTED AREA(S) TWICE DAILY   fluticasone 50 MCG/ACT nasal  spray Commonly known as: FLONASE Place into the nose.   folic acid 1 MG tablet Commonly known as: FOLVITE TAKE 1 TABLET BY MOUTH DAILY   lisinopril 10 MG tablet Commonly known as: ZESTRIL Take 20 mg by mouth daily.   Mesalamine 400 MG Cpdr DR capsule Commonly known as: ASACOL Take 2 capsules (800 mg total) by mouth 2 (two) times daily.   methenamine 1 g tablet Commonly known as: Hiprex Take 1 tablet (1 g total) by mouth at bedtime.   omeprazole 20 MG capsule Commonly known as: PRILOSEC TAKE ONE CAPSULE BY MOUTH TWICE DAILY   psyllium 58.6 % packet Commonly known as: METAMUCIL Take 1 packet by mouth daily.   Simethicone 180 MG Caps Commonly known as: Phazyme Take 1 capsule (180 mg total) by mouth every 6 (six) hours as needed.   sulfaSALAzine 500 MG tablet Commonly known as: AZULFIDINE Take 500 mg by mouth 4 (four) times daily.   trimethoprim 100 MG tablet Commonly known as: TRIMPEX Take 1 tablet (100 mg total) by mouth at bedtime.   vitamin B-12 500 MCG tablet Commonly known as: CYANOCOBALAMIN Take 1,000 mcg by mouth daily.       Allergies:  Allergies  Allergen Reactions  . Macrobid [Nitrofurantoin Macrocrystal] Other (See Comments)    Extreme dizziness  . Ciprofloxacin Nausea And Vomiting  . Nitrofurantoin Nausea And  Vomiting  . Codeine Rash and Itching  . Demerol Rash  . Latex Rash    Redness    Family History: Family History  Problem Relation Age of Onset  . Colon cancer Neg Hx     Social History:  reports that she has never smoked. She has never used smokeless tobacco. She reports that she does not drink alcohol and does not use drugs.  ROS: All other review of systems were reviewed and are negative except what is noted above in HPI  Physical Exam: BP (!) 181/87   Pulse 83   Temp 98.6 F (37 C)   Ht 5' 4"  (1.626 m)   Wt 138 lb (62.6 kg)   BMI 23.69 kg/m   Constitutional:  Alert and oriented, No acute distress. HEENT: [place] AT, moist  mucus membranes.  Trachea midline, no masses. Cardiovascular: No clubbing, cyanosis, or edema. Respiratory: Normal respiratory effort, no increased work of breathing. GI: Abdomen is soft, nontender, nondistended, no abdominal masses GU: No CVA tenderness.  Lymph: No cervical or inguinal lymphadenopathy. Skin: No rashes, bruises or suspicious lesions. Neurologic: Grossly intact, no focal deficits, moving all 4 extremities. Psychiatric: Normal mood and affect.  Laboratory Data: Lab Results  Component Value Date   WBC 5.9 04/16/2018   HGB 13.2 04/16/2018   HCT 39.6 04/16/2018   MCV 88 04/16/2018   PLT 291 04/16/2018    Lab Results  Component Value Date   CREATININE 0.96 05/08/2015    No results found for: PSA  No results found for: TESTOSTERONE  No results found for: HGBA1C  Urinalysis    Component Value Date/Time   COLORURINE YELLOW 05/08/2015 1505   APPEARANCEUR Clear 06/29/2020 1334   LABSPEC 1.008 05/08/2015 1505   PHURINE 5.0 05/08/2015 1505   GLUCOSEU Negative 06/29/2020 1334   HGBUR TRACE (A) 05/08/2015 1505   BILIRUBINUR Negative 06/29/2020 1334   KETONESUR NEG 05/08/2015 1505   PROTEINUR Negative 06/29/2020 1334   PROTEINUR NEG 05/08/2015 1505   UROBILINOGEN 0.2 05/08/2015 1505   NITRITE Negative 06/29/2020 1334   NITRITE NEG 05/08/2015 1505   LEUKOCYTESUR Negative 06/29/2020 1334    Lab Results  Component Value Date   LABMICR See below: 06/29/2020   WBCUA 0-5 06/29/2020   LABEPIT 0-10 06/29/2020   BACTERIA Few (A) 06/29/2020    Pertinent Imaging:  No results found for this or any previous visit.  No results found for this or any previous visit.  No results found for this or any previous visit.  No results found for this or any previous visit.  No results found for this or any previous visit.  No results found for this or any previous visit.  No results found for this or any previous visit.  No results found for this or any previous  visit.   Assessment & Plan:    1. Chronic cystitis -continue trimethroprim 152m qhs -RTC 6 months - Urinalysis, Routine w reflex microscopic - Bladder Scan (Post Void Residual) in office   Return in about 6 months (around 04/05/2021).  PNicolette Bang MD  [facility] [place]

## 2020-10-05 NOTE — Progress Notes (Signed)
Urological Symptom Review  Patient is experiencing the following symptoms: None   Review of Systems  Gastrointestinal (upper)  : Indigestion/heartburn  Gastrointestinal (lower) : Negative for lower GI symptoms  Constitutional : Negative for symptoms  Skin: Negative for skin symptoms  Eyes: Negative for eye symptoms  Ear/Nose/Throat : Sinus problems  Hematologic/Lymphatic: Negative for Hematologic/Lymphatic symptoms  Cardiovascular : Negative for cardiovascular symptoms  Respiratory : Negative for respiratory symptoms  Endocrine: Negative for endocrine symptoms  Musculoskeletal: Joint pain  Neurological: Negative for neurological symptoms  Psychologic: Negative for psychiatric symptoms

## 2020-10-05 NOTE — Patient Instructions (Signed)
Urinary Tract Infection, Adult A urinary tract infection (UTI) is an infection of any part of the urinary tract. The urinary tract includes:  The kidneys.  The ureters.  The bladder.  The urethra. These organs make, store, and get rid of pee (urine) in the body. What are the causes? This is caused by germs (bacteria) in your genital area. These germs grow and cause swelling (inflammation) of your urinary tract. What increases the risk? You are more likely to develop this condition if:  You have a small, thin tube (catheter) to drain pee.  You cannot control when you pee or poop (incontinence).  You are female, and: ? You use these methods to prevent pregnancy:  A medicine that kills sperm (spermicide).  A device that blocks sperm (diaphragm). ? You have low levels of a female hormone (estrogen). ? You are pregnant.  You have genes that add to your risk.  You are sexually active.  You take antibiotic medicines.  You have trouble peeing because of: ? A prostate that is bigger than normal, if you are female. ? A blockage in the part of your body that drains pee from the bladder (urethra). ? A kidney stone. ? A nerve condition that affects your bladder (neurogenic bladder). ? Not getting enough to drink. ? Not peeing often enough.  You have other conditions, such as: ? Diabetes. ? A weak disease-fighting system (immune system). ? Sickle cell disease. ? Gout. ? Injury of the spine. What are the signs or symptoms? Symptoms of this condition include:  Needing to pee right away (urgently).  Peeing often.  Peeing small amounts often.  Pain or burning when peeing.  Blood in the pee.  Pee that smells bad or not like normal.  Trouble peeing.  Pee that is cloudy.  Fluid coming from the vagina, if you are female.  Pain in the belly or lower back. Other symptoms include:  Throwing up (vomiting).  No urge to eat.  Feeling mixed up (confused).  Being tired  and grouchy (irritable).  A fever.  Watery poop (diarrhea). How is this treated? This condition may be treated with:  Antibiotic medicine.  Other medicines.  Drinking enough water. Follow these instructions at home:  Medicines  Take over-the-counter and prescription medicines only as told by your doctor.  If you were prescribed an antibiotic medicine, take it as told by your doctor. Do not stop taking it even if you start to feel better. General instructions  Make sure you: ? Pee until your bladder is empty. ? Do not hold pee for a long time. ? Empty your bladder after sex. ? Wipe from front to back after pooping if you are a female. Use each tissue one time when you wipe.  Drink enough fluid to keep your pee pale yellow.  Keep all follow-up visits as told by your doctor. This is important. Contact a doctor if:  You do not get better after 1-2 days.  Your symptoms go away and then come back. Get help right away if:  You have very bad back pain.  You have very bad pain in your lower belly.  You have a fever.  You are sick to your stomach (nauseous).  You are throwing up. Summary  A urinary tract infection (UTI) is an infection of any part of the urinary tract.  This condition is caused by germs in your genital area.  There are many risk factors for a UTI. These include having a small, thin   tube to drain pee and not being able to control when you pee or poop.  Treatment includes antibiotic medicines for germs.  Drink enough fluid to keep your pee pale yellow. This information is not intended to replace advice given to you by your health care provider. Make sure you discuss any questions you have with your health care provider. Document Revised: 10/07/2018 Document Reviewed: 04/29/2018 Elsevier Patient Education  2020 [employer] Inc[employer]  

## 2020-10-30 ENCOUNTER — Other Ambulatory Visit: Payer: Self-pay

## 2020-10-30 ENCOUNTER — Ambulatory Visit (INDEPENDENT_AMBULATORY_CARE_PROVIDER_SITE_OTHER): Payer: Medicare PPO | Admitting: Internal Medicine

## 2020-10-30 ENCOUNTER — Encounter (INDEPENDENT_AMBULATORY_CARE_PROVIDER_SITE_OTHER): Payer: Self-pay | Admitting: Internal Medicine

## 2020-10-30 VITALS — BP 179/94 | HR 91 | Temp 98.6°F | Ht 65.0 in | Wt 133.4 lb

## 2020-10-30 DIAGNOSIS — K219 Gastro-esophageal reflux disease without esophagitis: Secondary | ICD-10-CM

## 2020-10-30 DIAGNOSIS — K5150 Left sided colitis without complications: Secondary | ICD-10-CM

## 2020-10-30 MED ORDER — FAMOTIDINE 20 MG PO TABS: 20.0000 mg | ORAL_TABLET | Freq: Every day | ORAL | Status: DC

## 2020-10-30 MED ORDER — ESOMEPRAZOLE MAGNESIUM 40 MG PO CPDR
40.0000 mg | DELAYED_RELEASE_CAPSULE | Freq: Every day | ORAL | 5 refills | Status: DC
Start: 2020-10-30 — End: 2021-12-16

## 2020-10-30 MED ORDER — SULFASALAZINE 500 MG PO TABS: 500.0000 mg | ORAL_TABLET | Freq: Three times a day (TID) | ORAL | 3 refills | Status: DC

## 2020-10-30 NOTE — Patient Instructions (Signed)
Discontinue omeprazole. Begin Nexium/esomeprazole 40 mg by mouth 30 minutes before breakfast daily. Take Pepcid/famotidine OTC 20 mg by mouth daily at bedtime. Please call office with progress report in 2 to 4 weeks. If GERD symptoms are not well controlled with this combination would consider changing medication for ulcerative colitis as sulfasalazine may be the reason for persistent GERD symptoms and regurgitation

## 2020-10-30 NOTE — Progress Notes (Signed)
Presenting complaint;  Follow for chronic GERD and UC.  Database and subjective:  Patient is 86 year old Caucasian female who was several year history of distal ulcerative colitis as well as chronic GERD who is here for scheduled visit. Her last colonoscopy was in September 2013 revealing mildly active disease in sigmoid colon and rectum. She also has history of erosive reflux esophagitis documented on EGD of February 2009. EGD in January 2013 revealed healed esophagitis.  He states omeprazole is not working anymore. She is having regurgitation as well as heartburn. 1 day she had regurgitation into her throat and nose and had discomfort in her throat the next day. She is not having any swallowing difficulty. She denies abdominal pain. She has 1-2 formed bowel movements per day. All of her stools are formed. She denies melena or rectal bleeding or mucus discharge. Her appetite is normal. She has lost 3 lbs since her last visit. She does not feel that she is having is any side effects with sulfasalazine.  Current Medications: Outpatient Encounter Medications as of 10/30/2020  Medication Sig  . bisoprolol-hydrochlorothiazide (ZIAC) 5-6.25 MG per tablet Take 1 tablet by mouth daily.  . Calcium Carbonate-Vit D-Min (CALCIUM 600+D PLUS MINERALS) 600-400 MG-UNIT TABS Take by mouth 2 (two) times daily.   . cyanocobalamin 500 MCG tablet Take 1,000 mcg by mouth daily.   . fluticasone (FLONASE) 50 MCG/ACT nasal spray Place into the nose.  . folic acid (FOLVITE) 1 MG tablet TAKE 1 TABLET BY MOUTH DAILY  . lisinopril (ZESTRIL) 10 MG tablet Take 20 mg by mouth daily.   . Mesalamine (ASACOL) 400 MG CPDR DR capsule Take 2 capsules (800 mg total) by mouth 2 (two) times daily.  Marland Kitchen omeprazole (PRILOSEC) 20 MG capsule TAKE ONE CAPSULE BY MOUTH TWICE DAILY  . psyllium (METAMUCIL) 58.6 % packet Take 1 packet by mouth daily.  . Simethicone (PHAZYME) 180 MG CAPS Take 1 capsule (180 mg total) by mouth every 6 (six) hours  as needed.  . sulfaSALAzine (AZULFIDINE) 500 MG tablet Take 500 mg by mouth 4 (four) times daily.  Marland Kitchen trimethoprim (TRIMPEX) 100 MG tablet Take 1 tablet (100 mg total) by mouth at bedtime.  . AZO-CRANBERRY PO Take 2 tablets by mouth daily.  (Patient not taking: Reported on 10/30/2020)  . [DISCONTINUED] estradiol (ESTRACE) 0.1 MG/GM vaginal cream APPLY TO THE AFFECTED AREA(S) TWICE DAILY (Patient not taking: Reported on 10/05/2020)  . [DISCONTINUED] methenamine (HIPREX) 1 g tablet Take 1 tablet (1 g total) by mouth at bedtime.  . [DISCONTINUED] sucralfate (CARAFATE) 1 GM/10ML suspension Take 10 mLs (1 g total) by mouth 4 (four) times daily.   No facility-administered encounter medications on file as of 10/30/2020.    Objective: Blood pressure (!) 179/94, pulse 91, temperature 98.6 F (37 C), temperature source Oral, height 5' 5"  (1.651 m), weight 133 lb 6.4 oz (60.5 kg). Patient is alert and in no acute distress. She has hearing impairment. Conjunctiva is pink. Sclera is nonicteric Oropharyngeal mucosa is normal. No neck masses or thyromegaly noted. Cardiac exam with regular rhythm normal S1 and S2. She has soft systolic murmur best heard at aortic area. Lungs are clear to auscultation. Abdomen is symmetrical soft and nontender. Percussion note is sympathetic in epigastric region. No organomegaly or masses. She has trace to 1+ edema around her ankles.  Labs/studies Results: No recent lab on file. CBC from 07/19/2019 WBC 4.9, H&H 12.6 and 37.7 and platelet count 241K  Assessment:  #1. Chronic GERD. Symptoms are not  well controlled with omeprazole. It remains to be seen whether she has developed tachyphylaxis or her GERD symptoms are made worse with sulfasalazine. If she does not improve with change in PPI she may have to go back on oral mesalamine.  #2. Distal ulcerative colitis. She had been on oral mesalamine for years but was changed to sulfasalazine for cost reasons as her co-pay  became very high. I wonder if sulfasalazine is resulting in flareup of GERD symptoms.   Plan:  Discontinue omeprazole. Begin Nexium/omeprazole 40 mg by mouth 30 minutes before breakfast. Take Pepcid/famotidine 20 mg daily at bedtime. She will continue sulfasalazine and folic acid as before. Patient will call with progress report within 4 weeks. Office visit in 6 months.

## 2020-11-01 ENCOUNTER — Ambulatory Visit (INDEPENDENT_AMBULATORY_CARE_PROVIDER_SITE_OTHER): Payer: Medicare PPO | Admitting: Orthopaedic Surgery

## 2020-11-01 ENCOUNTER — Other Ambulatory Visit: Payer: Self-pay

## 2020-11-01 ENCOUNTER — Encounter: Payer: Self-pay | Admitting: Orthopaedic Surgery

## 2020-11-01 VITALS — Ht 63.0 in | Wt 133.0 lb

## 2020-11-01 DIAGNOSIS — M545 Low back pain, unspecified: Secondary | ICD-10-CM

## 2020-11-01 NOTE — Progress Notes (Signed)
Office Visit Note   Patient: Autumn Dean           Date of Birth: 12-Dec-1932           MRN: [id number] Visit Date: 11/01/2020              Requested by: Glenda Chroman, MD [place],  [place] [address] PCP: Glenda Chroman, MD   Assessment & Plan: Visit Diagnoses:  1. Acute right-sided low back pain, unspecified whether sciatica present     Plan: Patient's had improvement in her pain she states being more active and walking more has helped.  She will call if she has increased problems.  Follow-Up Instructions: No follow-ups on file.   Orders:  No orders of the defined types were placed in this encounter.  No orders of the defined types were placed in this encounter.     Procedures: No procedures performed   Clinical Data: No additional findings.   Subjective: Chief Complaint  Patient presents with  . Lower Back - Pain, Follow-up    HPI 86 year old female returns 6 months follow-up of low back pain.  She has scoliosis has some anterolisthesis at L4-5.  She states she is actually doing better still has little bit of balance problems uses a cane when she goes out of the house but does not use it inside the house.  She does a lot of turning twisting bending with house chores or activities she notices some increased symptoms.  She still has a brace she wears which tends to help her when she does housework.  No bowel bladder symptoms no history of falling.  Review of Systems updated unchanged from 53month ago visit on 04/26/2020.   Objective: Vital Signs: Ht 5' 3"  (1.6 m)   Wt 133 lb (60.3 kg)   BMI 23.56 kg/m   Physical Exam Constitutional:      Appearance: She is well-developed.  HENT:     Head: Normocephalic.     Right Ear: External ear normal.     Left Ear: External ear normal.  Eyes:     Pupils: Pupils are equal, round, and reactive to light.  Neck:     Thyroid: No thyromegaly.     Trachea: No tracheal deviation.  Cardiovascular:     Rate and  Rhythm: Normal rate.  Pulmonary:     Effort: Pulmonary effort is normal.  Abdominal:     Palpations: Abdomen is soft.  Skin:    General: Skin is warm and dry.  Neurological:     Mental Status: She is alert and oriented to person, place, and time.  Psychiatric:        Mood and Affect: Mood and affect normal.        Behavior: Behavior normal.     Ortho Exam patient slightly unsteady when she turns changing directions.  Normal heel toe gait.  Negative straight leg raising 90 degrees negative logroll the hips no pitting edema pulses palpable.  Specialty Comments:  No specialty comments available.  Imaging: No results found.   PMFS History: Patient Active Problem List   Diagnosis Date Noted  . Chronic cystitis 06/29/2020  . Left sided ulcerative colitis ([facility] 04/19/2019  . Acute right-sided low back pain 04/16/2017  . Tricuspid regurgitation 08/01/2014  . Mitral regurgitation 07/08/2013  . GERD (gastroesophageal reflux disease) 12/04/2011  . [facility] [place]um) ([facility] 12/04/2011  . Hypertension 12/04/2011   Past Medical History:  Diagnosis Date  .  GERD (gastroesophageal reflux disease)   . Hiatal hernia   . Hypertension   . Proctitis   . UC (ulcerative colitis confined to rectum) [facility])     Family History  Problem Relation Age of Onset  . Colon cancer Neg Hx     Past Surgical History:  Procedure Laterality Date  . COLONOSCOPY    . COLONOSCOPY  08/18/2012   Procedure: COLONOSCOPY;  Surgeon: Rogene Houston, MD;  [facility];  Service: Endoscopy;  Laterality: N/A;  1200  . DILATION AND CURETTAGE OF UTERUS    . ESOPHAGOGASTRODUODENOSCOPY  12/18/2011   Procedure: ESOPHAGOGASTRODUODENOSCOPY (EGD);  Surgeon: Rogene Houston, MD;  [facility];  Service: Endoscopy;  Laterality: N/A;  200  . TONSILLECTOMY     Social History   Occupational History  . Not on file  Tobacco Use  . Smoking status: Never Smoker  . Smokeless  tobacco: Never Used  Vaping Use  . Vaping Use: Never used  Substance and Sexual Activity  . Alcohol use: No    Alcohol/week: 0.0 standard drinks  . Drug use: No  . Sexual activity: Not on file

## 2020-11-06 ENCOUNTER — Encounter (INDEPENDENT_AMBULATORY_CARE_PROVIDER_SITE_OTHER): Payer: Self-pay

## 2020-11-06 ENCOUNTER — Telehealth (INDEPENDENT_AMBULATORY_CARE_PROVIDER_SITE_OTHER): Payer: Self-pay | Admitting: *Deleted

## 2020-11-06 NOTE — Telephone Encounter (Signed)
Nexium 40 mg is to much per patient. May we change to Nexium 20 mg?  From [place]:    the Nexum  genertic 90m is too much . ( i am not able to do what I need to do . I think it  is beginning to help . I think I do better on 256m, 40 makes me very tired . [facility]

## 2020-11-07 ENCOUNTER — Encounter (INDEPENDENT_AMBULATORY_CARE_PROVIDER_SITE_OTHER): Payer: Self-pay | Admitting: *Deleted

## 2020-11-07 ENCOUNTER — Other Ambulatory Visit (INDEPENDENT_AMBULATORY_CARE_PROVIDER_SITE_OTHER): Payer: Self-pay | Admitting: *Deleted

## 2020-11-07 DIAGNOSIS — K51211 Ulcerative (chronic) proctitis with rectal bleeding: Secondary | ICD-10-CM

## 2020-11-07 MED ORDER — ESOMEPRAZOLE MAGNESIUM 20 MG PO CPDR: 20.0000 mg | DELAYED_RELEASE_CAPSULE | Freq: Every day | ORAL | 11 refills | Status: DC

## 2020-11-07 NOTE — Telephone Encounter (Signed)
Yes

## 2020-11-07 NOTE — Telephone Encounter (Signed)
A new prescription for the Nexium 20 mg has been e scribed to Autumn Dean. Patient was sent a message in My Chart.

## 2020-11-07 NOTE — Telephone Encounter (Signed)
Prescription is being changed from Nexium 40 mg to Nexium 20 mg. Please refer to the previous note. Patient was sent a My Chart Message that Dr.Rehman had made the prescription change.

## 2020-11-12 DIAGNOSIS — H40003 Preglaucoma, unspecified, bilateral: Secondary | ICD-10-CM | POA: Diagnosis not present

## 2020-12-11 DIAGNOSIS — Z299 Encounter for prophylactic measures, unspecified: Secondary | ICD-10-CM | POA: Diagnosis not present

## 2020-12-11 DIAGNOSIS — E039 Hypothyroidism, unspecified: Secondary | ICD-10-CM | POA: Diagnosis not present

## 2020-12-11 DIAGNOSIS — I10 Essential (primary) hypertension: Secondary | ICD-10-CM | POA: Diagnosis not present

## 2021-02-22 ENCOUNTER — Other Ambulatory Visit: Payer: Self-pay

## 2021-02-22 ENCOUNTER — Emergency Department (HOSPITAL_COMMUNITY): Payer: Medicare PPO

## 2021-02-22 ENCOUNTER — Emergency Department (HOSPITAL_COMMUNITY)
Admission: EM | Admit: 2021-02-22 | Discharge: 2021-02-23 | Disposition: A | Discharge status: Home / Self Care | Payer: Medicare PPO | Attending: Emergency Medicine | Admitting: Emergency Medicine

## 2021-02-22 ENCOUNTER — Encounter (HOSPITAL_COMMUNITY): Payer: Self-pay

## 2021-02-22 DIAGNOSIS — R600 Localized edema: Secondary | ICD-10-CM | POA: Insufficient documentation

## 2021-02-22 DIAGNOSIS — Z79899 Other long term (current) drug therapy: Secondary | ICD-10-CM | POA: Insufficient documentation

## 2021-02-22 DIAGNOSIS — R102 Pelvic and perineal pain: Secondary | ICD-10-CM | POA: Diagnosis not present

## 2021-02-22 DIAGNOSIS — W19XXXA Unspecified fall, initial encounter: Secondary | ICD-10-CM | POA: Diagnosis not present

## 2021-02-22 DIAGNOSIS — M76892 Other specified enthesopathies of left lower limb, excluding foot: Secondary | ICD-10-CM | POA: Diagnosis not present

## 2021-02-22 DIAGNOSIS — Y9289 Other specified places as the place of occurrence of the external cause: Secondary | ICD-10-CM | POA: Insufficient documentation

## 2021-02-22 DIAGNOSIS — S7002XA Contusion of left hip, initial encounter: Secondary | ICD-10-CM | POA: Diagnosis not present

## 2021-02-22 DIAGNOSIS — D259 Leiomyoma of uterus, unspecified: Secondary | ICD-10-CM | POA: Diagnosis not present

## 2021-02-22 DIAGNOSIS — I10 Essential (primary) hypertension: Secondary | ICD-10-CM | POA: Diagnosis not present

## 2021-02-22 DIAGNOSIS — M76891 Other specified enthesopathies of right lower limb, excluding foot: Secondary | ICD-10-CM | POA: Diagnosis not present

## 2021-02-22 DIAGNOSIS — M545 Low back pain, unspecified: Secondary | ICD-10-CM | POA: Insufficient documentation

## 2021-02-22 DIAGNOSIS — M1612 Unilateral primary osteoarthritis, left hip: Secondary | ICD-10-CM | POA: Diagnosis not present

## 2021-02-22 DIAGNOSIS — R262 Difficulty in walking, not elsewhere classified: Secondary | ICD-10-CM | POA: Diagnosis not present

## 2021-02-22 DIAGNOSIS — Z91040 Latex allergy status: Secondary | ICD-10-CM | POA: Diagnosis not present

## 2021-02-22 DIAGNOSIS — M4319 Spondylolisthesis, multiple sites in spine: Secondary | ICD-10-CM | POA: Diagnosis not present

## 2021-02-22 DIAGNOSIS — M2578 Osteophyte, vertebrae: Secondary | ICD-10-CM | POA: Diagnosis not present

## 2021-02-22 DIAGNOSIS — Y9301 Activity, walking, marching and hiking: Secondary | ICD-10-CM | POA: Diagnosis not present

## 2021-02-22 DIAGNOSIS — S79912A Unspecified injury of left hip, initial encounter: Secondary | ICD-10-CM | POA: Diagnosis present

## 2021-02-22 DIAGNOSIS — S3993XA Unspecified injury of pelvis, initial encounter: Secondary | ICD-10-CM | POA: Diagnosis not present

## 2021-02-22 DIAGNOSIS — M533 Sacrococcygeal disorders, not elsewhere classified: Secondary | ICD-10-CM | POA: Diagnosis not present

## 2021-02-22 MED ORDER — HYDROCODONE-ACETAMINOPHEN 5-325 MG PO TABS
1.0000 | ORAL_TABLET | Freq: Once | ORAL | Status: AC
  Administered 2021-02-22: 1 via ORAL
  Filled 2021-02-22: qty 1

## 2021-02-22 NOTE — ED Notes (Signed)
Pt able to take a couple of steps with cane, but c/o pain - Dr Wyvonnia Dusky aware.

## 2021-02-22 NOTE — ED Provider Notes (Signed)
Care assumed from PA-C-Triplett.  Patient here with low back and buttock pain secondary to fall.  States she slipped and fell on her buttocks but not hit her head.  Complains of pain to her low back and bilateral groin and hips.  X-rays are negative.  Awaiting CT scan.  CT is negative for acute fracture or dislocation  Pain medication to be given.  Walking to be attempted.  Patient still with difficulty walking after Vicodin.  She is given a dose of Toradol as well as Robaxin.  Able to ambulate with walker and bear weight without difficulty.  Complains of most pain to her left gluteal fold.  There is a small area of ecchymosis and hematoma overlying her left lateral hip.  X-rays and CT scan are negative for acute fracture.  Able to ambulate with cane and walker.  States her neighbor will be able to take her home and watch her for the next day or 2. Return precautions discussed   Ezequiel Essex, MD 02/23/21 [phone]

## 2021-02-22 NOTE — ED Provider Notes (Signed)
[facility] EMERGENCY DEPARTMENT Provider Note   CSN: [id number] Arrival date & time: 02/22/21  2032     History Chief Complaint  Patient presents with  . Fall    Autumn Dean is a 87 y.o. female.  HPI      Autumn Dean is a 87 y.o. female who presents to the Emergency Department complaining of pain of her lower back and both buttocks secondary to mechanical fall that occurred earlier today.  She states that she was walking outside with her cane on a wooden deck and fell landing on her her buttocks.  She complains of pain along her lower buttock area with standing or walking.  She has some discomfort of her lower back as well.  Uses a cane or walker at baseline.  She denies any head injury, LOC, pain numbness or weakness of her lower extremities, urine or bowel changes and abdominal pain.  Lives at home alone and was able to get up without assistance.    Past Medical History:  Diagnosis Date  . GERD (gastroesophageal reflux disease)   . Hiatal hernia   . Hypertension   . Proctitis   . UC (ulcerative colitis confined to rectum) [facility])     Patient Active Problem List   Diagnosis Date Noted  . Chronic cystitis 06/29/2020  . Left sided ulcerative colitis ([place]) 04/19/2019  . Acute right-sided low back pain 04/16/2017  . Tricuspid regurgitation 08/01/2014  . Mitral regurgitation 07/08/2013  . GERD (gastroesophageal reflux disease) 12/04/2011  . UC (ulcerative colitis confined to rectum) ([place]) 12/04/2011  . Hypertension 12/04/2011    Past Surgical History:  Procedure Laterality Date  . COLONOSCOPY    . COLONOSCOPY  08/18/2012   Procedure: COLONOSCOPY;  Surgeon: Rogene Houston, MD;  Location: [facility] ENDO [facility];  Service: Endoscopy;  Laterality: N/A;  1200  . DILATION AND CURETTAGE OF UTERUS    . ESOPHAGOGASTRODUODENOSCOPY  12/18/2011   Procedure: ESOPHAGOGASTRODUODENOSCOPY (EGD);  Surgeon: Rogene Houston, MD;  Location: [facility] ENDO SUITE;  Service: Endoscopy;  Laterality:  N/A;  200  . TONSILLECTOMY       OB History   No obstetric history on file.     Family History  Problem Relation Age of Onset  . Colon cancer Neg Hx     Social History   Tobacco Use  . Smoking status: Never Smoker  . Smokeless tobacco: Never Used  Vaping Use  . Vaping Use: Never used  Substance Use Topics  . Alcohol use: No    Alcohol/week: 0.0 standard drinks  . Drug use: No    Home Medications Prior to Admission medications   Medication Sig Start Date End Date Taking? Authorizing Provider  bisoprolol-hydrochlorothiazide (ZIAC) 5-6.25 MG per tablet Take 1 tablet by mouth daily.    [provider]  Calcium Carbonate-Vit D-Min (CALCIUM 600+D PLUS MINERALS) 600-400 MG-UNIT TABS Take by mouth 2 (two) times daily.     [provider]  cyanocobalamin 500 MCG tablet Take 1,000 mcg by mouth daily.     [provider]  esomeprazole (NEXIUM) 20 MG capsule Take 1 capsule (20 mg total) by mouth daily at 12 noon. 11/07/20   Rogene Houston, MD  esomeprazole (NEXIUM) 40 MG capsule Take 1 capsule (40 mg total) by mouth daily before breakfast. 10/30/20   Rehman, Mechele Dawley, MD  famotidine (PEPCID) 20 MG tablet Take 1 tablet (20 mg total) by mouth at bedtime. 10/30/20   Rogene Houston, MD  fluticasone (  FLONASE) 50 MCG/ACT nasal spray Place into the nose.    [provider]  folic acid (FOLVITE) 1 MG tablet TAKE 1 TABLET BY MOUTH DAILY 06/04/20   Laurine Blazer B, PA-C  lisinopril (ZESTRIL) 10 MG tablet Take 20 mg by mouth daily.     [provider]  psyllium (METAMUCIL) 58.6 % packet Take 1 packet by mouth daily.    [provider]  Simethicone (PHAZYME) 180 MG CAPS Take 1 capsule (180 mg total) by mouth every 6 (six) hours as needed. 12/02/17   Rogene Houston, MD  sulfaSALAzine (AZULFIDINE) 500 MG tablet Take 1 tablet (500 mg total) by mouth 3 (three) times daily. 10/30/20   Rehman, Mechele Dawley, MD  trimethoprim (TRIMPEX) 100 MG tablet Take  1 tablet (100 mg total) by mouth at bedtime. 10/05/20   McKenzie, Candee Furbish, MD  sucralfate (CARAFATE) 1 GM/10ML suspension Take 10 mLs (1 g total) by mouth 4 (four) times daily. 12/04/11 01/28/12  Setzer, Rona Ravens, NP    Allergies    Macrobid [nitrofurantoin macrocrystal], Ciprofloxacin, Nitrofurantoin, Codeine, Demerol, and Latex  Review of Systems   Review of Systems  Constitutional: Negative for chills and fever.  Respiratory: Negative for chest tightness and shortness of breath.   Cardiovascular: Negative for chest pain.  Gastrointestinal: Negative for abdominal pain, nausea and vomiting.  Genitourinary: Negative for difficulty urinating and dysuria.  Musculoskeletal: Positive for back pain and myalgias (bilateral buttock pain). Negative for arthralgias and joint swelling.  Skin: Negative for color change and wound.  Neurological: Negative for syncope, weakness, numbness and headaches.  Psychiatric/Behavioral: Negative for confusion.    Physical Exam Updated Vital Signs BP (!) 151/79 (BP Location: Left Arm)   Pulse 100   Temp 97.9 F (36.6 C) (Oral)   Resp 18   Ht 5' 3"  (1.6 m)   Wt 60.3 kg   SpO2 96%   BMI 23.55 kg/m   Physical Exam Vitals and nursing note reviewed.  Constitutional:      Appearance: Normal appearance. She is not ill-appearing or toxic-appearing.  HENT:     Head: Atraumatic.     Mouth/Throat:     Mouth: Mucous membranes are moist.  Cardiovascular:     Rate and Rhythm: Regular rhythm.     Pulses: Normal pulses.  Pulmonary:     Effort: Pulmonary effort is normal.     Breath sounds: Normal breath sounds.  Chest:     Chest wall: No tenderness.  Abdominal:     Palpations: Abdomen is soft.     Tenderness: There is no abdominal tenderness.  Musculoskeletal:        General: Tenderness and signs of injury present.     Cervical back: Normal range of motion. No tenderness.     Right lower leg: Edema present.     Left lower leg: Edema present.      Comments: Tenderness palpation of the lower lumbar spine and bilateral SI joints.  Pain reproduced with straight leg raise bilaterally.  Skin:    General: Skin is warm.     Capillary Refill: Capillary refill takes less than 2 seconds.  Neurological:     General: No focal deficit present.     Mental Status: She is alert.     Sensory: No sensory deficit.     Motor: No weakness.     ED Results / Procedures / Treatments   Labs (all labs ordered are listed, but only abnormal results are displayed) Labs Reviewed -  No data to display  EKG None  Radiology DG Lumbar Spine Complete  Result Date: 02/22/2021 CLINICAL DATA:  Fall today with lumbosacral back pain. EXAM: LUMBAR SPINE - COMPLETE 4+ VIEW COMPARISON:  Reformats from abdominopelvic CT 05/11/2015 FINDINGS: Broad-based levo scoliotic curvature of the lumbar spine. Vertebral body heights are normal. No evidence of acute fracture. Mild diffuse degenerative disc disease. Moderate lower lumbar facet hypertrophy. The bones are diffusely under mineralized. Sacroiliac joints are congruent with left greater than right degenerative change. Aortic atherosclerosis. Calcified uterine fibroids in the pelvis. IMPRESSION: 1. No evidence of acute fracture of the lumbar spine. 2. Scoliosis with multilevel degenerative change. Electronically Signed   By: Keith Rake M.D.   On: 02/22/2021 22:00   DG Pelvis 1-2 Views  Result Date: 02/22/2021 CLINICAL DATA:  Fall today. EXAM: PELVIS - 1-2 VIEW COMPARISON:  None. FINDINGS: The cortical margins of the bony pelvis are intact. No fracture. Pubic symphysis and sacroiliac joints are congruent. Both femoral heads are well-seated in the respective acetabula. Mild bilateral acetabular spurring. IMPRESSION: No pelvic fracture. Electronically Signed   By: Keith Rake M.D.   On: 02/22/2021 22:01    Procedures Procedures   Medications Ordered in ED Medications - No data to display  ED Course  I have  reviewed the triage vital signs and the nursing notes.  Pertinent labs & imaging results that were available during my care of the patient were reviewed by me and considered in my medical decision making (see chart for details).    MDM Rules/Calculators/A&P                          Patient here for evaluation of low back pain and pelvic pain secondary to mechanical fall that occurred earlier today.  She describes pain with standing or attempted walking.  Pain resolves when at rest.  [place] on a wooden deck landing on her buttocks.  Pain does not radiate into her lower extremities.  No head injury or LOC.  Patient walks with a cane or walker at baseline.  We will get plain film imaging of the L-spine and pelvis.  On recheck, patient resting comfortably.  Declines offer of pain medication here.  Plain film imaging without evidence of acute osseous injury.  I have attempted to ambulate patient and she describes pain to the bilateral groin area and lower back with standing.  I am concerned that she may have an occult pelvic fracture.  Will order CT pelvis for further evaluation  Pt findings discussed with overnight provider, Dr. Wyvonnia Dusky who agrees to review imaging results and arrange dispo.  If CT pelvis negative, she will likely be discharged home with ortho f/u  Final Clinical Impression(s) / ED Diagnoses Final diagnoses:  None    Rx / DC Orders ED Discharge Orders    None       Bufford Lope 02/22/21 2333    Ezequiel Essex, MD 02/23/21 [phone]

## 2021-02-22 NOTE — ED Notes (Signed)
Family called Niece Shirlean Mylar   # [phone]

## 2021-02-22 NOTE — ED Triage Notes (Signed)
Pt reports falling at home today, landing on buttocks. Pt c/o pain in both buttocks and upper thighs. Pt was able to stand per EMS but unable to walk due to pain. Pt uses cain. Pt denies LOC. No obvious deformity noted.

## 2021-02-23 ENCOUNTER — Emergency Department (HOSPITAL_COMMUNITY): Payer: Medicare PPO

## 2021-02-23 DIAGNOSIS — M1612 Unilateral primary osteoarthritis, left hip: Secondary | ICD-10-CM | POA: Diagnosis not present

## 2021-02-23 MED ORDER — NAPROXEN 250 MG PO TABS: 250.0000 mg | ORAL_TABLET | Freq: Two times a day (BID) | ORAL | 0 refills | Status: DC | PRN

## 2021-02-23 MED ORDER — METHOCARBAMOL 500 MG PO TABS
500.0000 mg | ORAL_TABLET | Freq: Once | ORAL | Status: AC
  Administered 2021-02-23: 500 mg via ORAL
  Filled 2021-02-23: qty 1

## 2021-02-23 MED ORDER — KETOROLAC TROMETHAMINE 30 MG/ML IJ SOLN
30.0000 mg | Freq: Once | INTRAMUSCULAR | Status: AC
  Administered 2021-02-23: 30 mg via INTRAMUSCULAR
  Filled 2021-02-23: qty 1

## 2021-02-23 NOTE — ED Notes (Signed)
Pt able to walk using walker with standby assist. Dr Wyvonnia Dusky aware.

## 2021-02-23 NOTE — ED Notes (Signed)
Pt able to take a couple of steps with cane, but c/o pain - Dr Wyvonnia Dusky aware.

## 2021-02-23 NOTE — ED Notes (Signed)
Pt neighbor called for transport- reports she will bring walker for pt to use- ETA around 30 minutes

## 2021-02-23 NOTE — Discharge Instructions (Signed)
Your x-rays and CT scans are negative for any fractures.  You likely have a strain or sprain as well as a contusion and bruise.  You may use ice and anti-inflammatories as prescribed.  Follow-up with your doctor.  Return to the ED with worsening pain, weakness, numbness, any other concerns.

## 2021-02-25 ENCOUNTER — Encounter (HOSPITAL_COMMUNITY): Payer: Self-pay | Admitting: Emergency Medicine

## 2021-02-25 ENCOUNTER — Other Ambulatory Visit: Payer: Self-pay

## 2021-02-25 ENCOUNTER — Emergency Department (HOSPITAL_COMMUNITY)
Admission: EM | Admit: 2021-02-25 | Discharge: 2021-02-27 | Disposition: A | Discharge status: Home / Self Care | Payer: Medicare PPO | Attending: Emergency Medicine | Admitting: Emergency Medicine

## 2021-02-25 DIAGNOSIS — Z20822 Contact with and (suspected) exposure to covid-19: Secondary | ICD-10-CM | POA: Diagnosis not present

## 2021-02-25 DIAGNOSIS — M79605 Pain in left leg: Secondary | ICD-10-CM | POA: Diagnosis not present

## 2021-02-25 DIAGNOSIS — E871 Hypo-osmolality and hyponatremia: Secondary | ICD-10-CM

## 2021-02-25 DIAGNOSIS — M25552 Pain in left hip: Secondary | ICD-10-CM | POA: Diagnosis not present

## 2021-02-25 DIAGNOSIS — W19XXXD Unspecified fall, subsequent encounter: Secondary | ICD-10-CM | POA: Insufficient documentation

## 2021-02-25 DIAGNOSIS — S8992XA Unspecified injury of left lower leg, initial encounter: Secondary | ICD-10-CM | POA: Diagnosis not present

## 2021-02-25 DIAGNOSIS — R410 Disorientation, unspecified: Secondary | ICD-10-CM | POA: Diagnosis not present

## 2021-02-25 DIAGNOSIS — W19XXXA Unspecified fall, initial encounter: Secondary | ICD-10-CM | POA: Diagnosis not present

## 2021-02-25 LAB — CBC
HCT: 36.1 % (ref 36.0–46.0)
Hemoglobin: 12.1 g/dL (ref 12.0–15.0)
MCH: 31 pg (ref 26.0–34.0)
MCHC: 33.5 g/dL (ref 30.0–36.0)
MCV: 92.6 fL (ref 80.0–100.0)
Platelets: 195 10*3/uL (ref 150–400)
RBC: 3.9 MIL/uL (ref 3.87–5.11)
RDW: 12 % (ref 11.5–15.5)
WBC: 6 10*3/uL (ref 4.0–10.5)
nRBC: 0 % (ref 0.0–0.2)

## 2021-02-25 LAB — BASIC METABOLIC PANEL
Anion gap: 13 (ref 5–15)
BUN: 26 mg/dL — ABNORMAL HIGH (ref 8–23)
CO2: 20 mmol/L — ABNORMAL LOW (ref 22–32)
Calcium: 9.3 mg/dL (ref 8.9–10.3)
Chloride: 89 mmol/L — ABNORMAL LOW (ref 98–111)
Creatinine, Ser: 1.25 mg/dL — ABNORMAL HIGH (ref 0.44–1.00)
GFR, Estimated: 42 mL/min — ABNORMAL LOW (ref >60)
Glucose, Bld: 83 mg/dL (ref 70–99)
Potassium: 3.9 mmol/L (ref 3.5–5.1)
Sodium: 122 mmol/L — ABNORMAL LOW (ref 135–145)

## 2021-02-25 MED ORDER — METHOCARBAMOL 500 MG PO TABS: 500.0000 mg | ORAL_TABLET | Freq: Every evening | ORAL | 0 refills | Status: DC | PRN

## 2021-02-25 MED ORDER — PREDNISONE 50 MG PO TABS
60.0000 mg | ORAL_TABLET | Freq: Once | ORAL | Status: AC
  Administered 2021-02-26: 60 mg via ORAL
  Filled 2021-02-25: qty 1

## 2021-02-25 MED ORDER — METHOCARBAMOL 500 MG PO TABS
500.0000 mg | ORAL_TABLET | Freq: Once | ORAL | Status: AC
  Administered 2021-02-25: 500 mg via ORAL
  Filled 2021-02-25: qty 1

## 2021-02-25 MED ORDER — KETOROLAC TROMETHAMINE 30 MG/ML IJ SOLN
15.0000 mg | Freq: Once | INTRAMUSCULAR | Status: AC
  Administered 2021-02-25: 15 mg via INTRAMUSCULAR
  Filled 2021-02-25: qty 1

## 2021-02-25 NOTE — Discharge Instructions (Addendum)
Continue taking home medications as prescribed. Continue taking the naproxen that was prescribed to you last time. Use ice/heat to help with your pain. It is important to continue to move as this will help your pain. Follow-up with your primary care doctor as needed for recheck. Your primary care doctor should be the one helping arrange SNF placement as needed.  Return to the emergency room with any new, worsening, concerning symptoms.

## 2021-02-25 NOTE — ED Notes (Signed)
Went to ambulate Pt and she states that she already tried to walk with a walker earlier and almost fell. I assisted pt to stand by the bedside in the hallway 4 and she states that her left leg hurts to bad to walk. Also states that she does not have to use the bathroom at this time. RN notified.

## 2021-02-25 NOTE — ED Triage Notes (Signed)
Pt to the ED [facility] with complaints of soreness from a fall on 02/22/21.  Pt was seen here on the 22nd for the same. Pt states she has pain in the Left hip more than normal. She was able to bare weight with EMS, but refused to walk. Patients family is in the process of finding SNF placement.

## 2021-02-25 NOTE — ED Notes (Addendum)
[facility] called ED for update on pt and asks when he should pick pt up. Nephew advises that they are trying to get pt in [place] but pt needs a TB test and an evaluation by them prior to admission. Nephew states Autumn Dean is questioning as to whether pt needs SNF, AL, or LTC. Logan Vegh can be contacted at [phone].

## 2021-02-25 NOTE — ED Provider Notes (Signed)
[facility] EMERGENCY DEPARTMENT Provider Note   CSN: [id number] Arrival date & time: 02/25/21  1354     History Chief Complaint  Patient presents with  . Autumn Dean is a 87 y.o. female presenting for evaluation of left hip pain.  Patient states she fell a few days ago.  Since then, she has continued to have pain of her left hip.  She was able to walk when she was discharged a few days ago, but states pain worsened today.  She took Tylenol without improvement of symptoms.  She has not tried anything else.  She does not know what she was sent home with.  She denies new fall, trauma, or injury.  Pain does not radiate.  She denies headache, back pain, chest pain, loss of bowel or bladder control.  Additional history obtained from chart review.  I reviewed patient's recent ER visit including negative CT and x-rays.  HPI     Past Medical History:  Diagnosis Date  . GERD (gastroesophageal reflux disease)   . Hiatal hernia   . Hypertension   . Proctitis   . UC (ulcerative colitis confined to rectum) [facility])     Patient Active Problem List   Diagnosis Date Noted  . Chronic cystitis 06/29/2020  . Left sided ulcerative colitis ([place]) 04/19/2019  . Acute right-sided low back pain 04/16/2017  . Tricuspid regurgitation 08/01/2014  . Mitral regurgitation 07/08/2013  . GERD (gastroesophageal reflux disease) 12/04/2011  . UC (ulcerative colitis confined to rectum) ([place]) 12/04/2011  . Hypertension 12/04/2011    Past Surgical History:  Procedure Laterality Date  . COLONOSCOPY    . COLONOSCOPY  08/18/2012   Procedure: COLONOSCOPY;  Surgeon: Rogene Houston, MD;  Location: [facility];  Service: Endoscopy;  Laterality: N/A;  1200  . DILATION AND CURETTAGE OF UTERUS    . ESOPHAGOGASTRODUODENOSCOPY  12/18/2011   Procedure: ESOPHAGOGASTRODUODENOSCOPY (EGD);  Surgeon: Rogene Houston, MD;  Location: [facility];  Service: Endoscopy;  Laterality: N/A;  200  . TONSILLECTOMY        OB History   No obstetric history on file.     Family History  Problem Relation Age of Onset  . Colon cancer Neg Hx     Social History   Tobacco Use  . Smoking status: Never Smoker  . Smokeless tobacco: Never Used  Vaping Use  . Vaping Use: Never used  Substance Use Topics  . Alcohol use: No    Alcohol/week: 0.0 standard drinks  . Drug use: No    Home Medications Prior to Admission medications   Medication Sig Start Date End Date Taking? Authorizing Provider  bisoprolol-hydrochlorothiazide (ZIAC) 5-6.25 MG per tablet Take 1 tablet by mouth daily.    [provider]  Calcium Carbonate-Vit D-Min (CALCIUM 600+D PLUS MINERALS) 600-400 MG-UNIT TABS Take by mouth 2 (two) times daily.     [provider]  cyanocobalamin 500 MCG tablet Take 1,000 mcg by mouth daily.     [provider]  esomeprazole (NEXIUM) 20 MG capsule Take 1 capsule (20 mg total) by mouth daily at 12 noon. 11/07/20   Rogene Houston, MD  esomeprazole (NEXIUM) 40 MG capsule Take 1 capsule (40 mg total) by mouth daily before breakfast. 10/30/20   Rehman, Mechele Dawley, MD  famotidine (PEPCID) 20 MG tablet Take 1 tablet (20 mg total) by mouth at bedtime. 10/30/20   Rogene Houston, MD  fluticasone (FLONASE) 50 MCG/ACT nasal spray Place  into the nose.    [provider]  folic acid (FOLVITE) 1 MG tablet TAKE 1 TABLET BY MOUTH DAILY 06/04/20   Laurine Blazer B, PA-C  lisinopril (ZESTRIL) 10 MG tablet Take 20 mg by mouth daily.     [provider]  lisinopril (ZESTRIL) 20 MG tablet Take 1 tablet by mouth daily. 02/08/21   [provider]  naproxen (NAPROSYN) 250 MG tablet Take 1 tablet (250 mg total) by mouth 2 (two) times daily as needed for moderate pain. 02/23/21   Rancour, Annie Main, MD  psyllium (METAMUCIL) 58.6 % packet Take 1 packet by mouth daily.    [provider]  Simethicone (PHAZYME) 180 MG CAPS Take 1 capsule (180 mg total) by mouth every 6 (six)  hours as needed. 12/02/17   Rogene Houston, MD  sulfaSALAzine (AZULFIDINE) 500 MG tablet Take 1 tablet (500 mg total) by mouth 3 (three) times daily. 10/30/20   Rehman, Mechele Dawley, MD  trimethoprim (TRIMPEX) 100 MG tablet Take 1 tablet (100 mg total) by mouth at bedtime. 10/05/20   McKenzie, Candee Furbish, MD  sucralfate (CARAFATE) 1 GM/10ML suspension Take 10 mLs (1 g total) by mouth 4 (four) times daily. 12/04/11 01/28/12  Setzer, Rona Ravens, NP    Allergies    Macrobid [nitrofurantoin macrocrystal], Ciprofloxacin, Nitrofurantoin, Codeine, Demerol, and Latex  Review of Systems   Review of Systems  Musculoskeletal: Positive for arthralgias.  All other systems reviewed and are negative.   Physical Exam Updated Vital Signs BP (!) 152/77 (BP Location: Left Arm)   Pulse 89   Temp 98.3 F (36.8 C) (Oral)   Resp 18   Ht 5' 3"  (1.6 m)   Wt 60.3 kg   SpO2 97%   BMI 23.55 kg/m   Physical Exam Vitals and nursing note reviewed.  Constitutional:      General: She is not in acute distress.    Appearance: She is well-developed.     Comments: Resting in the bed in NAD  HENT:     Head: Normocephalic and atraumatic.  Eyes:     Conjunctiva/sclera: Conjunctivae normal.     Pupils: Pupils are equal, round, and reactive to light.  Cardiovascular:     Rate and Rhythm: Normal rate and regular rhythm.     Pulses: Normal pulses.  Pulmonary:     Effort: Pulmonary effort is normal. No respiratory distress.     Breath sounds: Normal breath sounds. No wheezing.  Abdominal:     General: There is no distension.     Palpations: Abdomen is soft. There is no mass.     Tenderness: There is no abdominal tenderness. There is no guarding or rebound.  Musculoskeletal:        General: Tenderness present. Normal range of motion.     Cervical back: Normal range of motion and neck supple.     Right lower leg: No edema.     Left lower leg: No edema.     Comments: Tenderness palpation of the left hip.  No obvious  deformity.  No erythema or warmth.  No saddle anesthesia.  Pedal pulses 2+ bilaterally. No ttp of back or midline spine  Skin:    General: Skin is warm and dry.     Capillary Refill: Capillary refill takes less than 2 seconds.  Neurological:     Mental Status: She is alert and oriented to person, place, and time.     ED Results / Procedures / Treatments   Labs (  all labs ordered are listed, but only abnormal results are displayed) Labs Reviewed  URINALYSIS, ROUTINE W REFLEX MICROSCOPIC    EKG None  Radiology No results found.  Procedures Procedures   Medications Ordered in ED Medications  ketorolac (TORADOL) 30 MG/ML injection 15 mg (15 mg Intramuscular Given 02/25/21 1716)  methocarbamol (ROBAXIN) tablet 500 mg (500 mg Oral Given 02/25/21 1716)    ED Course  I have reviewed the triage vital signs and the nursing notes.  Pertinent labs & imaging results that were available during my care of the patient were reviewed by me and considered in my medical decision making (see chart for details).    MDM Rules/Calculators/A&P                          Patient presenting for evaluation of left hip pain after fall several days ago.  On exam, patient is nontoxic.  She is neurovascular intact.  Per chart review, she had negative CT and x-ray imaging.  I do not believe repeat imaging would be beneficial as she did not have recurrent fall.  Will treat symptomatically for pain.  She is also reporting urinary symptoms, obtain a UA.  Patient reports improved symptoms after Toradol and Robaxin.  Will attempt to ambulate.  Per RN, there are concerns from family about patient ability to stay at home tonight.  Patient lives at home by herself, she confirms this is her normal.  Family is not in town tonight.  Patient is currently in the process of being placed in the SNF.  Pt signed out to Marval Regal, PA-C for f/u on labs, UA, and ambulation status.   Final Clinical Impression(s) / ED  Diagnoses Final diagnoses:  None    Rx / DC Orders ED Discharge Orders    None       Franchot Heidelberg, PA-C 02/25/21 1911    Milton Ferguson, MD 02/26/21 1213

## 2021-02-25 NOTE — ED Notes (Signed)
Pt tolerating po fluids and food well.

## 2021-02-25 NOTE — ED Provider Notes (Addendum)
Autumn Dean is a 87 y.o. female, presenting to the ED with persistent left hip pain.   HPI from [place], PA-C: "Autumn Dean is a 87 y.o. female presenting for evaluation of left hip pain.  Patient states she fell a few days ago.  Since then, she has continued to have pain of her left hip.  She was able to walk when she was discharged a few days ago, but states pain worsened today.  She took Tylenol without improvement of symptoms.  She has not tried anything else.  She does not know what she was sent home with.  She denies new fall, trauma, or injury.  Pain does not radiate.  She denies headache, back pain, chest pain, loss of bowel or bladder control.  Additional history obtained from chart review.  I reviewed patient's recent ER visit including negative CT and x-rays."      Physical Exam  BP (!) 152/77 (BP Location: Left Arm)   Pulse 89   Temp 98.3 F (36.8 C) (Oral)   Resp 18   Ht 5' 3"  (1.6 m)   Wt 60.3 kg   SpO2 97%   BMI 23.55 kg/m   Physical Exam Vitals and nursing note reviewed.  Constitutional:      General: She is not in acute distress.    Appearance: She is well-developed. She is not diaphoretic.  HENT:     Head: Normocephalic and atraumatic.  Eyes:     Conjunctiva/sclera: Conjunctivae normal.  Cardiovascular:     Rate and Rhythm: Normal rate and regular rhythm.     Pulses:          Dorsalis pedis pulses are 2+ on the right side and 2+ on the left side.  Pulmonary:     Effort: Pulmonary effort is normal.  Musculoskeletal:     Cervical back: Neck supple.  Skin:    General: Skin is warm and dry.     Capillary Refill: Capillary refill takes less than 2 seconds.     Coloration: Skin is not pale.  Neurological:     Mental Status: She is alert.  Psychiatric:        Behavior: Behavior normal.     ED Course/Procedures     Procedures   Abnormal Labs Reviewed  BASIC METABOLIC PANEL - Abnormal; Notable for the following components:       Result Value   Sodium 122 (*)    Chloride 89 (*)    CO2 20 (*)    BUN 26 (*)    Creatinine, Ser 1.25 (*)    GFR, Estimated 42 (*)    All other components within normal limits    MDM   Patient care handoff report received from [facility], PA-C. Plan: Follow-up on lab work.  Patient has been considering facility placement and may need this sooner than later.  Patient was evaluated on April 22 following a fall.  Negative x-ray and CT. Pain seems to be worse yesterday and today. Patient would not walk while under my care.  She demonstrated no neurologic deficits, but would state her hip hurt too much for her to walk.  A TOC consult has been placed, which will likely happen in the morning. PT consult also placed.  Another consideration would be to perform MRI on patient's left hip for more detailed evaluation.  At the end of my shift, the proposed plan was communicated to oncoming EDP, Dr. Florina Ou.  Vitals:   02/25/21 1415 02/25/21 1423  02/25/21 1939 02/26/21 0000  BP:  (!) 152/77 (!) 156/89 (!) 155/85  Pulse:  89 89 90  Resp:  18 16 20   Temp:  98.3 F (36.8 C) 97.8 F (36.6 C)   TempSrc:  Oral Oral   SpO2:  97% 97% 98%  Weight: 60.3 kg     Height: 5' 3"  (1.6 m)           Lorayne Bender, PA-C 02/26/21 0051    Lorayne Bender, PA-C 02/26/21 9977    Milton Ferguson, MD 02/26/21 1213

## 2021-02-26 LAB — URINALYSIS, ROUTINE W REFLEX MICROSCOPIC
Bilirubin Urine: NEGATIVE
Glucose, UA: NEGATIVE mg/dL
Ketones, ur: 20 mg/dL — AB
Nitrite: NEGATIVE
Protein, ur: NEGATIVE mg/dL
Specific Gravity, Urine: 1.013 (ref 1.005–1.030)
pH: 5 (ref 5.0–8.0)

## 2021-02-26 NOTE — NC FL2 (Signed)
[place] MEDICAID FL2 LEVEL OF CARE SCREENING TOOL     IDENTIFICATION  Patient Name: Autumn Dean Birthdate: 1933/09/09 Sex: female Admission Date (Current Location): 02/25/2021  [facility] and [place] Number:  [employer] and Address:  [facility] [address] [place]      Provider Number: [id number]  Attending Physician Name and Address:  Default, Provider, MD  Relative Name and Phone Number:  Illona Bulman [phone]    Current Level of Care: Hospital Recommended Level of Care: [place] Prior Approval Number:    Date Approved/Denied:   PASRR Number: [id number]  Discharge Plan: SNF    Current Diagnoses: Patient Active Problem List   Diagnosis Date Noted  . Chronic cystitis 06/29/2020  . Left sided ulcerative colitis ([place]) 04/19/2019  . Acute right-sided low back pain 04/16/2017  . Tricuspid regurgitation 08/01/2014  . Mitral regurgitation 07/08/2013  . GERD (gastroesophageal reflux disease) 12/04/2011  . UC (ulcerative colitis confined to rectum) ([place]) 12/04/2011  . Hypertension 12/04/2011    Orientation RESPIRATION BLADDER Height & Weight     Self,Time,Situation,Place  Normal Continent Weight: 132 lb 15 oz (60.3 kg) Height:  5' 3"  (160 cm)  BEHAVIORAL SYMPTOMS/MOOD NEUROLOGICAL BOWEL NUTRITION STATUS      Continent Diet (Heart healthy)  AMBULATORY STATUS COMMUNICATION OF NEEDS Skin   Extensive Assist Verbally Normal                       Personal Care Assistance Level of Assistance  Bathing,Feeding,Dressing,Total care Bathing Assistance: Maximum assistance Feeding assistance: Independent Dressing Assistance: Maximum assistance Total Care Assistance: Maximum assistance   Functional Limitations Info  Sight,Hearing,Speech Sight Info: Adequate Hearing Info: Adequate Speech Info: Adequate    SPECIAL CARE FACTORS FREQUENCY  PT (By licensed PT),OT (By licensed OT)     PT Frequency: 5  times weekly OT Frequency: 5 times weekly            Contractures Contractures Info: Not present    Additional Factors Info  Allergies   Allergies Info: Macrobid (nitrofurantoin Macrocrystal), Ciprofloxacin, Nirtofurantoin, Codenine, Demerol, Latex           Current Medications (02/26/2021):  This is the current hospital active medication list No current facility-administered medications for this encounter.   Current Outpatient Medications  Medication Sig Dispense Refill  . acetaminophen (TYLENOL) 325 MG tablet Take 650 mg by mouth every 6 (six) hours as needed for mild pain.    . bisoprolol-hydrochlorothiazide (ZIAC) 5-6.25 MG per tablet Take 1 tablet by mouth daily.    . cyanocobalamin 500 MCG tablet Take 1,000 mcg by mouth daily.     Marland Kitchen esomeprazole (NEXIUM) 20 MG capsule Take 1 capsule (20 mg total) by mouth daily at 12 noon. 30 capsule 11  . fluticasone (FLONASE) 50 MCG/ACT nasal spray Place 1 spray into both nostrils daily as needed for allergies.    . folic acid (FOLVITE) 1 MG tablet TAKE 1 TABLET BY MOUTH DAILY (Patient taking differently: Take 1 mg by mouth daily.) 90 tablet 4  . lisinopril (ZESTRIL) 20 MG tablet Take 20 mg by mouth daily.    . methocarbamol (ROBAXIN) 500 MG tablet Take 1 tablet (500 mg total) by mouth at bedtime as needed for muscle spasms. 8 tablet 0  . naproxen (NAPROSYN) 250 MG tablet Take 1 tablet (250 mg total) by mouth 2 (two) times daily as needed for moderate pain. 30 tablet 0  . sulfaSALAzine (AZULFIDINE) 500  MG tablet Take 1 tablet (500 mg total) by mouth 3 (three) times daily. 270 tablet 3  . trimethoprim (TRIMPEX) 100 MG tablet Take 1 tablet (100 mg total) by mouth at bedtime. 90 tablet 3  . esomeprazole (NEXIUM) 40 MG capsule Take 1 capsule (40 mg total) by mouth daily before breakfast. (Patient not taking: No sig reported) 30 capsule 5  . famotidine (PEPCID) 20 MG tablet Take 1 tablet (20 mg total) by mouth at bedtime. (Patient not taking: No  sig reported)    . Simethicone (PHAZYME) 180 MG CAPS Take 1 capsule (180 mg total) by mouth every 6 (six) hours as needed. (Patient not taking: No sig reported) 120 capsule 2     Discharge Medications: Please see discharge summary for a list of discharge medications.  Relevant Imaging Results:  Relevant Lab Results:   Additional Information SSN: [id number], [place]

## 2021-02-26 NOTE — TOC Progression Note (Addendum)
Pts family has agreed on pt going to [facility]. CSW spoke to [place] with [facility] who states they have a bed for pt. They can accept pt in the morning (4/26). TOC to send needed discharge paperwork to [facility] in the morning.  Pts insurance Josem Kaufmann has been approved with a start date of 4/26, the next review date is 4/28. The care coordinator is Clemmie Krill. The [employer] health reference ID is [id number]. Pts auth ID has not generated at this time but Josem Kaufmann has been approved.   Addendum 4:20pm: CSW met pts nephew in ED to speak with pt. Pts nephew brought pt a bag with a cell phone, chargers, her hearing aid chargers, and some clothes. Bag was placed on the bed with the pt. Pts nephew now has pts wallet and checkbook, these will be taken back to pt when she arrives at [facility] tomorrow morning.

## 2021-02-26 NOTE — ED Notes (Signed)
Physical therapy in to eval pt and verbally stated he recommends SNF placement.

## 2021-02-26 NOTE — Evaluation (Signed)
Physical Therapy Evaluation Patient Details Name: Autumn Dean MRN: [id number] DOB: 06-May-1933 Today's Date: 02/26/2021   History of Present Illness  Autumn Dean is a 87 y.o. female who presents to the Emergency Department complaining of pain of her lower back and both buttocks secondary to mechanical fall that occurred earlier today.  She states that she was walking outside with her cane on a wooden deck and fell landing on her her buttocks.  She complains of pain along her lower buttock area with standing or walking.  She has some discomfort of her lower back as well.  Uses a cane or walker at baseline.  She denies any head injury, LOC, pain numbness or weakness of her lower extremities, urine or bowel changes and abdominal pain.  Lives at home alone and was able to get up without assistance.    Clinical Impression  Patient demonstrates slow labored movement for sitting up at bedside with c/o increased pain right hip/buttock area, unsteady on feet, at high risk for falls with antalgic gait on RLE, decreased step/stride length RLE and mostly limited for ambulation due to increasing right buttock/hip pain.  Patient able to transfer to commode in bathroom with slow labored movement and put back to bed with Min/mod assist to move BLE.  Patient will benefit from continued physical therapy in hospital and recommended venue below to increase strength, balance, endurance for safe ADLs and gait.     Follow Up Recommendations SNF    Equipment Recommendations  Rolling walker with 5" wheels    Recommendations for Other Services       Precautions / Restrictions Precautions Precautions: Fall Restrictions Weight Bearing Restrictions: No      Mobility  Bed Mobility Overal bed mobility: Needs Assistance Bed Mobility: Supine to Sit;Sit to Supine     Supine to sit: Min guard Sit to supine: Min assist   General bed mobility comments: increased time, labored movement     Transfers Overall transfer level: Needs assistance Equipment used: Rolling walker (2 wheeled) Transfers: Sit to/from [facility] Sit to Stand: Min assist Stand pivot transfers: Min assist;Mod assist       General transfer comment: slow labored movement transferring to commode in bathroom  Ambulation/Gait Ambulation/Gait assistance: Min assist;Mod assist Gait Distance (Feet): 25 Feet Assistive device: Rolling walker (2 wheeled) Gait Pattern/deviations: Decreased step length - right;Decreased step length - left;Decreased stance time - right;Decreased stride length;Antalgic Gait velocity: decreased   General Gait Details: slow labored cadence with difficulty advancing RLE due to increased hip/buttock pain  Stairs            Wheelchair Mobility    Modified Rankin (Stroke Patients Only)       Balance Overall balance assessment: Needs assistance Sitting-balance support: Feet supported;No upper extremity supported Sitting balance-Leahy Scale: Fair Sitting balance - Comments: seated at EOB   Standing balance support: During functional activity;Bilateral upper extremity supported Standing balance-Leahy Scale: Fair Standing balance comment: using RW                             Pertinent Vitals/Pain Pain Assessment: Faces Faces Pain Scale: Hurts even more Pain Location: left hip with movement Pain Descriptors / Indicators: Sore;Grimacing;Guarding Pain Intervention(s): Limited activity within patient's tolerance;Monitored during session;Repositioned    Home Living Family/patient expects to be discharged to:: Private residence Living Arrangements: Alone Available Help at Discharge: Family;Available PRN/intermittently Type of Home: House Home Access: Stairs to enter  Entrance Stairs-Rails: Right;Left;Can reach both Entrance Stairs-Number of Steps: 3 Home Layout: Two level;Able to live on main level with bedroom/bathroom;Full bath on main  level Home Equipment: Cane - single point      Prior Function Level of Independence: Independent with assistive device(s)         Comments: Hydrographic surveyor using SPC PRN, drives     Hand Dominance        Extremity/Trunk Assessment   Upper Extremity Assessment Upper Extremity Assessment: Generalized weakness    Lower Extremity Assessment Lower Extremity Assessment: Generalized weakness    Cervical / Trunk Assessment Cervical / Trunk Assessment: Normal  Communication   Communication: HOH  Cognition Arousal/Alertness: Awake/alert Behavior During Therapy: WFL for tasks assessed/performed Overall Cognitive Status: Within Functional Limits for tasks assessed                                        General Comments      Exercises     Assessment/Plan    PT Assessment Patient needs continued PT services  PT Problem List Decreased strength;Decreased activity tolerance;Decreased balance;Decreased mobility;Pain       PT Treatment Interventions DME instruction;Gait training;Stair training;Functional mobility training;Therapeutic activities;Therapeutic exercise;Balance training;Patient/family education    PT Goals (Current goals can be found in the Care Plan section)  Acute Rehab PT Goals Patient Stated Goal: return home after rehab PT Goal Formulation: With patient Time For Goal Achievement: 03/12/21 Potential to Achieve Goals: Good    Frequency Min 2X/week   Barriers to discharge        Co-evaluation               AM-PAC PT "6 Clicks" Mobility  Outcome Measure Help needed turning from your back to your side while in a flat bed without using bedrails?: A Lot Help needed moving from lying on your back to sitting on the side of a flat bed without using bedrails?: A Little Help needed moving to and from a bed to a chair (including a wheelchair)?: A Lot Help needed standing up from a chair using your arms (e.g., wheelchair or bedside  chair)?: A Little Help needed to walk in hospital room?: A Lot Help needed climbing 3-5 steps with a railing? : A Lot 6 Click Score: 14    End of Session   Activity Tolerance: Patient tolerated treatment well;Patient limited by fatigue;Patient limited by pain Patient left: in bed Nurse Communication: Mobility status PT Visit Diagnosis: Unsteadiness on feet (R26.81);Other abnormalities of gait and mobility (R26.89);History of falling (Z91.81);Muscle weakness (generalized) (M62.81)    Time: 5397-6734 PT Time Calculation (min) (ACUTE ONLY): 28 min   Charges:   PT Evaluation $PT Eval Moderate Complexity: 1 Mod PT Treatments $Therapeutic Activity: 23-37 mins        12:26 PM, 02/26/21 Lonell Grandchild, MPT Physical Therapist with [facility] [phone] office [phone] mobile phone

## 2021-02-26 NOTE — ED Notes (Signed)
Dinner tray given to pt

## 2021-02-26 NOTE — ED Notes (Signed)
Pt has been attempting to get out of the bed all night. Pt redirected and told that she was at the ED and that she could not get out the of bed. Pt moved to a visible part of the hall where she can be monitored more effectively. Will continue to monitor pt.

## 2021-02-26 NOTE — ED Notes (Signed)
Breakfast tray given to pt 

## 2021-02-26 NOTE — TOC Initial Note (Addendum)
Transition of Care [facility]) - Initial/Assessment Note    Patient Details  Name: Autumn Dean MRN: [id number] Date of Birth: 20-Sep-1933  Transition of Care [facility]) CM/SW Contact:    Iona Beard, [place] Phone Number: 02/26/2021, 9:04 AM  Clinical Narrative:                 [facility] consulted as pts family has been working on having pt placed at [facility]. However, now they are worried pt will need SNF as she is having trouble ambulating. CSW spoke with pts nephew/POA [name] to complete assessment. He states that pt has been living alone up until 4 days ago. He feels she may now need higher care than an ALF. CSW explained that PT would be seeing pt this morning and CSW will send out referral based on what PT recommends. [name] asked that referral for SNF be sent out in [place]. TOC to follow.   Addendum 11:40: CSW spoke with pt and she is agreeable to SNF referral, she is also agreeable to her nephew making bed decision. CSW spoke with [facility] to provide him with current bed offers. He is going to review them with family and will update CSW as soon as they make a decision. CSW start auth with [employer], clinicals will be faxed when PT notes have been completed. TOC to follow.   Expected Discharge Plan: Skilled Nursing Facility Barriers to Discharge: Continued Medical Work up,ED SNF auth   Patient Goals and CMS Choice Patient states their goals for this hospitalization and ongoing recovery are:: Go to SNF CMS Medicare.gov Compare Post Acute Care list provided to:: Patient Choice offered to / list presented to : [place] / Guardian  Expected Discharge Plan and Services Expected Discharge Plan: [place] In-house Referral: Clinical Social Work Discharge Planning Services: CM Consult Post Acute Care Choice: [place] arrangements for the past 2 months: [place]                                      Prior Living  Arrangements/Services Living arrangements for the past 2 months: Single Family Home Lives with:: Self Patient language and need for interpreter reviewed:: Yes Do you feel safe going back to the place where you live?: Yes      Need for Family Participation in Patient Care: Yes (Comment) Care giver support system in place?: Yes (comment)   Criminal Activity/Legal Involvement Pertinent to Current Situation/Hospitalization: No - Comment as needed  Activities of Daily Living      Permission Sought/Granted                  Emotional Assessment Appearance:: Appears stated age Attitude/Demeanor/Rapport: Other (comment) (Spoke with POA) Affect (typically observed): Other (comment) (Spoke with POA) Orientation: : Oriented to Self,Oriented to Place,Oriented to  Time,Oriented to Situation Alcohol / Substance Use: Not Applicable Psych Involvement: No (comment)  Admission diagnosis:  EMS Patient Active Problem List   Diagnosis Date Noted  . Chronic cystitis 06/29/2020  . Left sided ulcerative colitis ([place]) 04/19/2019  . Acute right-sided low back pain 04/16/2017  . Tricuspid regurgitation 08/01/2014  . Mitral regurgitation 07/08/2013  . GERD (gastroesophageal reflux disease) 12/04/2011  . UC (ulcerative colitis confined to rectum) ([place]) 12/04/2011  . Hypertension 12/04/2011   PCP:  Glenda Chroman, MD Pharmacy:   [facility] [place]  [address] [place] [address] Phone: [phone] Fax: [phone]     Social Determinants of Health (SDOH) Interventions    Readmission Risk Interventions No flowsheet data found.

## 2021-02-26 NOTE — Plan of Care (Signed)
  Problem: Acute Rehab PT Goals(only PT should resolve) Goal: Pt Will Go Supine/Side To Sit Outcome: Progressing Flowsheets (Taken 02/26/2021 1229) Pt will go Supine/Side to Sit: with min guard assist Goal: Patient Will Transfer Sit To/From Stand Outcome: Progressing Flowsheets (Taken 02/26/2021 1229) Patient will transfer sit to/from stand: with min guard assist Goal: Pt Will Transfer Bed To Chair/Chair To Bed Outcome: Progressing Flowsheets (Taken 02/26/2021 1229) Pt will Transfer Bed to Chair/Chair to Bed:  min guard assist  with min assist Goal: Pt Will Ambulate Outcome: Progressing Flowsheets (Taken 02/26/2021 1229) Pt will Ambulate:  50 feet  with minimal assist  with min guard assist  with rolling walker   12:30 PM, 02/26/21 Lonell Grandchild, MPT Physical Therapist with [facility] [phone] office [phone] mobile phone

## 2021-02-26 NOTE — ED Notes (Signed)
Lunch try given to pt. Pt able to feed self.

## 2021-02-27 DIAGNOSIS — R262 Difficulty in walking, not elsewhere classified: Secondary | ICD-10-CM | POA: Diagnosis not present

## 2021-02-27 DIAGNOSIS — R2681 Unsteadiness on feet: Secondary | ICD-10-CM | POA: Diagnosis not present

## 2021-02-27 DIAGNOSIS — M6281 Muscle weakness (generalized): Secondary | ICD-10-CM | POA: Diagnosis not present

## 2021-02-27 DIAGNOSIS — Z9181 History of falling: Secondary | ICD-10-CM | POA: Diagnosis not present

## 2021-02-27 DIAGNOSIS — Z7401 Bed confinement status: Secondary | ICD-10-CM | POA: Diagnosis not present

## 2021-02-27 DIAGNOSIS — R1312 Dysphagia, oropharyngeal phase: Secondary | ICD-10-CM | POA: Diagnosis not present

## 2021-02-27 DIAGNOSIS — I480 Paroxysmal atrial fibrillation: Secondary | ICD-10-CM | POA: Diagnosis not present

## 2021-02-27 DIAGNOSIS — M79609 Pain in unspecified limb: Secondary | ICD-10-CM | POA: Diagnosis not present

## 2021-02-27 DIAGNOSIS — Z5181 Encounter for therapeutic drug level monitoring: Secondary | ICD-10-CM | POA: Diagnosis not present

## 2021-02-27 DIAGNOSIS — I129 Hypertensive chronic kidney disease with stage 1 through stage 4 chronic kidney disease, or unspecified chronic kidney disease: Secondary | ICD-10-CM | POA: Diagnosis not present

## 2021-02-27 DIAGNOSIS — Z20822 Contact with and (suspected) exposure to covid-19: Secondary | ICD-10-CM | POA: Diagnosis not present

## 2021-02-27 DIAGNOSIS — S329XXA Fracture of unspecified parts of lumbosacral spine and pelvis, initial encounter for closed fracture: Secondary | ICD-10-CM | POA: Diagnosis not present

## 2021-02-27 DIAGNOSIS — R4182 Altered mental status, unspecified: Secondary | ICD-10-CM | POA: Diagnosis not present

## 2021-02-27 DIAGNOSIS — K449 Diaphragmatic hernia without obstruction or gangrene: Secondary | ICD-10-CM | POA: Diagnosis not present

## 2021-02-27 DIAGNOSIS — M25552 Pain in left hip: Secondary | ICD-10-CM | POA: Diagnosis not present

## 2021-02-27 DIAGNOSIS — F4320 Adjustment disorder, unspecified: Secondary | ICD-10-CM | POA: Diagnosis not present

## 2021-02-27 DIAGNOSIS — F0280 Dementia in other diseases classified elsewhere without behavioral disturbance: Secondary | ICD-10-CM | POA: Diagnosis not present

## 2021-02-27 DIAGNOSIS — I10 Essential (primary) hypertension: Secondary | ICD-10-CM | POA: Diagnosis not present

## 2021-02-27 DIAGNOSIS — R279 Unspecified lack of coordination: Secondary | ICD-10-CM | POA: Diagnosis not present

## 2021-02-27 DIAGNOSIS — E871 Hypo-osmolality and hyponatremia: Secondary | ICD-10-CM | POA: Diagnosis not present

## 2021-02-27 DIAGNOSIS — E870 Hyperosmolality and hypernatremia: Secondary | ICD-10-CM | POA: Diagnosis not present

## 2021-02-27 DIAGNOSIS — K219 Gastro-esophageal reflux disease without esophagitis: Secondary | ICD-10-CM | POA: Diagnosis not present

## 2021-02-27 DIAGNOSIS — F411 Generalized anxiety disorder: Secondary | ICD-10-CM | POA: Diagnosis not present

## 2021-02-27 DIAGNOSIS — R41841 Cognitive communication deficit: Secondary | ICD-10-CM | POA: Diagnosis not present

## 2021-02-27 DIAGNOSIS — F0390 Unspecified dementia without behavioral disturbance: Secondary | ICD-10-CM | POA: Diagnosis not present

## 2021-02-27 DIAGNOSIS — N183 Chronic kidney disease, stage 3 unspecified: Secondary | ICD-10-CM | POA: Diagnosis not present

## 2021-02-27 DIAGNOSIS — K5190 Ulcerative colitis, unspecified, without complications: Secondary | ICD-10-CM | POA: Diagnosis not present

## 2021-02-27 DIAGNOSIS — F419 Anxiety disorder, unspecified: Secondary | ICD-10-CM | POA: Diagnosis not present

## 2021-02-27 DIAGNOSIS — L89312 Pressure ulcer of right buttock, stage 2: Secondary | ICD-10-CM | POA: Diagnosis not present

## 2021-02-27 DIAGNOSIS — M6258 Muscle wasting and atrophy, not elsewhere classified, other site: Secondary | ICD-10-CM | POA: Diagnosis not present

## 2021-02-27 DIAGNOSIS — N3020 Other chronic cystitis without hematuria: Secondary | ICD-10-CM | POA: Diagnosis not present

## 2021-02-27 LAB — BASIC METABOLIC PANEL
Anion gap: 10 (ref 5–15)
BUN: 24 mg/dL — ABNORMAL HIGH (ref 8–23)
CO2: 26 mmol/L (ref 22–32)
Calcium: 9.4 mg/dL (ref 8.9–10.3)
Chloride: 91 mmol/L — ABNORMAL LOW (ref 98–111)
Creatinine, Ser: 0.85 mg/dL (ref 0.44–1.00)
GFR, Estimated: >60 mL/min (ref >60)
Glucose, Bld: 93 mg/dL (ref 70–99)
Potassium: 3.7 mmol/L (ref 3.5–5.1)
Sodium: 127 mmol/L — ABNORMAL LOW (ref 135–145)

## 2021-02-27 LAB — RESP PANEL BY RT-PCR (FLU A&B, COVID) ARPGX2
Influenza A by PCR: NEGATIVE
Influenza B by PCR: NEGATIVE
SARS Coronavirus 2 by RT PCR: NEGATIVE

## 2021-02-27 MED ORDER — TRIMETHOPRIM 100 MG PO TABS
100.0000 mg | ORAL_TABLET | Freq: Every day | ORAL | Status: DC
  Filled 2021-02-27 (×2): qty 1

## 2021-02-27 MED ORDER — LISINOPRIL 10 MG PO TABS
20.0000 mg | ORAL_TABLET | Freq: Every day | ORAL | Status: DC
  Administered 2021-02-27: 20 mg via ORAL
  Filled 2021-02-27: qty 2

## 2021-02-27 MED ORDER — PANTOPRAZOLE SODIUM 40 MG PO TBEC
40.0000 mg | DELAYED_RELEASE_TABLET | Freq: Every day | ORAL | Status: DC
  Administered 2021-02-27: 40 mg via ORAL
  Filled 2021-02-27: qty 1

## 2021-02-27 MED ORDER — FOLIC ACID 1 MG PO TABS
1.0000 mg | ORAL_TABLET | Freq: Every day | ORAL | Status: DC
  Administered 2021-02-27: 1 mg via ORAL
  Filled 2021-02-27: qty 1

## 2021-02-27 MED ORDER — VITAMIN B-12 1000 MCG PO TABS
1000.0000 ug | ORAL_TABLET | Freq: Every day | ORAL | Status: DC
  Administered 2021-02-27: 1000 ug via ORAL
  Filled 2021-02-27: qty 1

## 2021-02-27 MED ORDER — SULFASALAZINE 500 MG PO TABS
500.0000 mg | ORAL_TABLET | Freq: Three times a day (TID) | ORAL | Status: DC
  Administered 2021-02-27: 500 mg via ORAL
  Filled 2021-02-27 (×7): qty 1

## 2021-02-27 NOTE — ED Notes (Signed)
Attempted call report twice, no answer with transfer.

## 2021-02-27 NOTE — ED Notes (Signed)
Spoke with [facility] Scheduler to arrange transport to [facility] in [place]

## 2021-02-27 NOTE — ED Notes (Signed)
Pt sit up for breakfast.  Pt able to feed herself.  Took medications with any issues.

## 2021-02-27 NOTE — TOC Transition Note (Signed)
Transition of Care [facility]) - CM/SW Discharge Note   Patient Details  Name: Autumn Dean MRN: [id number] Date of Birth: 11-Apr-1933  Transition of Care [facility]) CM/SW Contact:  Iona Beard, [place] Phone Number: 02/27/2021, 10:44 AM   Clinical Narrative:    CSW requested pt have COVID test completed before d/c to [facility]. Pts COVID results came back negative. CSW updated Lorenza Chick with [facility], CSW sent AVS to [place]. CSW updated pts family that pt will be transferring to [facility] soon as everything has been set up. Family is understanding of transfer and will visit pt at facility. Pt will be going to room 1202p TOC signing off.   Final next level of care: Skilled Nursing Facility Barriers to Discharge: ED Barriers Resolved   Patient Goals and CMS Choice Patient states their goals for this hospitalization and ongoing recovery are:: Go to SNF CMS Medicare.gov Compare Post Acute Care list provided to:: Patient Choice offered to / list presented to : [facility] POA / Guardian,Patient  Discharge Placement                Patient to be transferred to facility by: EMS Name of family member notified: Emilio Aspen Patient and family notified of of transfer: 02/27/21  Discharge Plan and Services In-house Referral: Clinical Social Work Discharge Planning Services: [employer] Consult Post Acute Care Choice: [place]          DME Arranged: N/A DME Agency: NA       HH Arranged: NA HH Agency: NA        Social Determinants of Health (SDOH) Interventions     Readmission Risk Interventions No flowsheet data found.

## 2021-02-27 NOTE — ED Notes (Signed)
Pt transferred to [facility] for sitter to watch.  Pt attempting to get OOB.

## 2021-02-27 NOTE — ED Provider Notes (Addendum)
  Physical Exam  BP (!) 158/80   Pulse 83   Temp 98.4 F (36.9 C) (Oral)   Resp 18   Ht 5' 3"  (1.6 m)   Wt 60.3 kg   SpO2 100%   BMI 23.55 kg/m   Physical Exam  ED Course/Procedures     Procedures  MDM  Patient with reported hip pain.  Appears to been accepted at [facility] and appears to be transferring there this morning.  Somewhat difficulty keeping patient in bed last night.       Davonna Belling, MD 02/27/21 [phone]  COVID test requested.  I have ordered.  However also reviewed sodium of 122.  Will recheck this morning.  Reportedly pending transfer to [facility] this morning.    Davonna Belling, MD 02/27/21 [phone]  Patient is COVID-negative.  Sodium is improved to 127.  Still needs following but this can be done at the nursing home.    Davonna Belling, MD 02/27/21 [phone]

## 2021-02-28 DIAGNOSIS — M6281 Muscle weakness (generalized): Secondary | ICD-10-CM | POA: Diagnosis not present

## 2021-02-28 DIAGNOSIS — E871 Hypo-osmolality and hyponatremia: Secondary | ICD-10-CM | POA: Diagnosis not present

## 2021-02-28 DIAGNOSIS — K219 Gastro-esophageal reflux disease without esophagitis: Secondary | ICD-10-CM | POA: Diagnosis not present

## 2021-02-28 DIAGNOSIS — N3020 Other chronic cystitis without hematuria: Secondary | ICD-10-CM | POA: Diagnosis not present

## 2021-02-28 DIAGNOSIS — R262 Difficulty in walking, not elsewhere classified: Secondary | ICD-10-CM | POA: Diagnosis not present

## 2021-02-28 DIAGNOSIS — M79609 Pain in unspecified limb: Secondary | ICD-10-CM | POA: Diagnosis not present

## 2021-02-28 DIAGNOSIS — K449 Diaphragmatic hernia without obstruction or gangrene: Secondary | ICD-10-CM | POA: Diagnosis not present

## 2021-02-28 DIAGNOSIS — Z9181 History of falling: Secondary | ICD-10-CM | POA: Diagnosis not present

## 2021-02-28 DIAGNOSIS — K5190 Ulcerative colitis, unspecified, without complications: Secondary | ICD-10-CM | POA: Diagnosis not present

## 2021-02-28 DIAGNOSIS — I10 Essential (primary) hypertension: Secondary | ICD-10-CM | POA: Diagnosis not present

## 2021-02-28 DIAGNOSIS — R2681 Unsteadiness on feet: Secondary | ICD-10-CM | POA: Diagnosis not present

## 2021-03-01 DIAGNOSIS — E871 Hypo-osmolality and hyponatremia: Secondary | ICD-10-CM | POA: Diagnosis not present

## 2021-03-01 DIAGNOSIS — N183 Chronic kidney disease, stage 3 unspecified: Secondary | ICD-10-CM | POA: Diagnosis not present

## 2021-03-01 DIAGNOSIS — M25552 Pain in left hip: Secondary | ICD-10-CM | POA: Diagnosis not present

## 2021-03-01 DIAGNOSIS — F0390 Unspecified dementia without behavioral disturbance: Secondary | ICD-10-CM | POA: Diagnosis not present

## 2021-03-04 DIAGNOSIS — K5190 Ulcerative colitis, unspecified, without complications: Secondary | ICD-10-CM | POA: Diagnosis not present

## 2021-03-04 DIAGNOSIS — M25552 Pain in left hip: Secondary | ICD-10-CM | POA: Diagnosis not present

## 2021-03-04 DIAGNOSIS — I480 Paroxysmal atrial fibrillation: Secondary | ICD-10-CM | POA: Diagnosis not present

## 2021-03-07 DIAGNOSIS — I129 Hypertensive chronic kidney disease with stage 1 through stage 4 chronic kidney disease, or unspecified chronic kidney disease: Secondary | ICD-10-CM | POA: Diagnosis not present

## 2021-03-07 DIAGNOSIS — E871 Hypo-osmolality and hyponatremia: Secondary | ICD-10-CM | POA: Diagnosis not present

## 2021-03-07 DIAGNOSIS — R2681 Unsteadiness on feet: Secondary | ICD-10-CM | POA: Diagnosis not present

## 2021-03-07 DIAGNOSIS — Z9181 History of falling: Secondary | ICD-10-CM | POA: Diagnosis not present

## 2021-03-07 DIAGNOSIS — K449 Diaphragmatic hernia without obstruction or gangrene: Secondary | ICD-10-CM | POA: Diagnosis not present

## 2021-03-07 DIAGNOSIS — K5190 Ulcerative colitis, unspecified, without complications: Secondary | ICD-10-CM | POA: Diagnosis not present

## 2021-03-07 DIAGNOSIS — N3020 Other chronic cystitis without hematuria: Secondary | ICD-10-CM | POA: Diagnosis not present

## 2021-03-07 DIAGNOSIS — F419 Anxiety disorder, unspecified: Secondary | ICD-10-CM | POA: Diagnosis not present

## 2021-03-07 DIAGNOSIS — K219 Gastro-esophageal reflux disease without esophagitis: Secondary | ICD-10-CM | POA: Diagnosis not present

## 2021-03-07 DIAGNOSIS — M6281 Muscle weakness (generalized): Secondary | ICD-10-CM | POA: Diagnosis not present

## 2021-03-07 DIAGNOSIS — R262 Difficulty in walking, not elsewhere classified: Secondary | ICD-10-CM | POA: Diagnosis not present

## 2021-03-07 DIAGNOSIS — I10 Essential (primary) hypertension: Secondary | ICD-10-CM | POA: Diagnosis not present

## 2021-03-07 DIAGNOSIS — M79609 Pain in unspecified limb: Secondary | ICD-10-CM | POA: Diagnosis not present

## 2021-03-08 DIAGNOSIS — M25552 Pain in left hip: Secondary | ICD-10-CM | POA: Diagnosis not present

## 2021-03-08 DIAGNOSIS — S329XXA Fracture of unspecified parts of lumbosacral spine and pelvis, initial encounter for closed fracture: Secondary | ICD-10-CM | POA: Diagnosis not present

## 2021-03-08 DIAGNOSIS — F0280 Dementia in other diseases classified elsewhere without behavioral disturbance: Secondary | ICD-10-CM | POA: Diagnosis not present

## 2021-03-08 DIAGNOSIS — R262 Difficulty in walking, not elsewhere classified: Secondary | ICD-10-CM | POA: Diagnosis not present

## 2021-03-11 DIAGNOSIS — N3020 Other chronic cystitis without hematuria: Secondary | ICD-10-CM | POA: Diagnosis not present

## 2021-03-11 DIAGNOSIS — I10 Essential (primary) hypertension: Secondary | ICD-10-CM | POA: Diagnosis not present

## 2021-03-11 DIAGNOSIS — K219 Gastro-esophageal reflux disease without esophagitis: Secondary | ICD-10-CM | POA: Diagnosis not present

## 2021-03-11 DIAGNOSIS — K449 Diaphragmatic hernia without obstruction or gangrene: Secondary | ICD-10-CM | POA: Diagnosis not present

## 2021-03-11 DIAGNOSIS — M79609 Pain in unspecified limb: Secondary | ICD-10-CM | POA: Diagnosis not present

## 2021-03-11 DIAGNOSIS — M6281 Muscle weakness (generalized): Secondary | ICD-10-CM | POA: Diagnosis not present

## 2021-03-11 DIAGNOSIS — K5190 Ulcerative colitis, unspecified, without complications: Secondary | ICD-10-CM | POA: Diagnosis not present

## 2021-03-11 DIAGNOSIS — R2681 Unsteadiness on feet: Secondary | ICD-10-CM | POA: Diagnosis not present

## 2021-03-11 DIAGNOSIS — Z9181 History of falling: Secondary | ICD-10-CM | POA: Diagnosis not present

## 2021-03-14 DIAGNOSIS — K219 Gastro-esophageal reflux disease without esophagitis: Secondary | ICD-10-CM | POA: Diagnosis not present

## 2021-03-14 DIAGNOSIS — I10 Essential (primary) hypertension: Secondary | ICD-10-CM | POA: Diagnosis not present

## 2021-03-14 DIAGNOSIS — K449 Diaphragmatic hernia without obstruction or gangrene: Secondary | ICD-10-CM | POA: Diagnosis not present

## 2021-03-14 DIAGNOSIS — M79609 Pain in unspecified limb: Secondary | ICD-10-CM | POA: Diagnosis not present

## 2021-03-14 DIAGNOSIS — R262 Difficulty in walking, not elsewhere classified: Secondary | ICD-10-CM | POA: Diagnosis not present

## 2021-03-14 DIAGNOSIS — I129 Hypertensive chronic kidney disease with stage 1 through stage 4 chronic kidney disease, or unspecified chronic kidney disease: Secondary | ICD-10-CM | POA: Diagnosis not present

## 2021-03-14 DIAGNOSIS — R2681 Unsteadiness on feet: Secondary | ICD-10-CM | POA: Diagnosis not present

## 2021-03-14 DIAGNOSIS — K5190 Ulcerative colitis, unspecified, without complications: Secondary | ICD-10-CM | POA: Diagnosis not present

## 2021-03-14 DIAGNOSIS — M6281 Muscle weakness (generalized): Secondary | ICD-10-CM | POA: Diagnosis not present

## 2021-03-14 DIAGNOSIS — Z9181 History of falling: Secondary | ICD-10-CM | POA: Diagnosis not present

## 2021-03-14 DIAGNOSIS — F0390 Unspecified dementia without behavioral disturbance: Secondary | ICD-10-CM | POA: Diagnosis not present

## 2021-03-14 DIAGNOSIS — N3020 Other chronic cystitis without hematuria: Secondary | ICD-10-CM | POA: Diagnosis not present

## 2021-03-14 DIAGNOSIS — E871 Hypo-osmolality and hyponatremia: Secondary | ICD-10-CM | POA: Diagnosis not present

## 2021-03-16 DIAGNOSIS — L89322 Pressure ulcer of left buttock, stage 2: Secondary | ICD-10-CM | POA: Diagnosis not present

## 2021-03-16 DIAGNOSIS — L89621 Pressure ulcer of left heel, stage 1: Secondary | ICD-10-CM | POA: Diagnosis not present

## 2021-03-16 DIAGNOSIS — L89312 Pressure ulcer of right buttock, stage 2: Secondary | ICD-10-CM | POA: Diagnosis not present

## 2021-03-16 DIAGNOSIS — I129 Hypertensive chronic kidney disease with stage 1 through stage 4 chronic kidney disease, or unspecified chronic kidney disease: Secondary | ICD-10-CM | POA: Diagnosis not present

## 2021-03-16 DIAGNOSIS — I081 Rheumatic disorders of both mitral and tricuspid valves: Secondary | ICD-10-CM | POA: Diagnosis not present

## 2021-03-16 DIAGNOSIS — E871 Hypo-osmolality and hyponatremia: Secondary | ICD-10-CM | POA: Diagnosis not present

## 2021-03-16 DIAGNOSIS — I499 Cardiac arrhythmia, unspecified: Secondary | ICD-10-CM | POA: Diagnosis not present

## 2021-03-16 DIAGNOSIS — L89611 Pressure ulcer of right heel, stage 1: Secondary | ICD-10-CM | POA: Diagnosis not present

## 2021-03-16 DIAGNOSIS — N189 Chronic kidney disease, unspecified: Secondary | ICD-10-CM | POA: Diagnosis not present

## 2021-03-19 DIAGNOSIS — E871 Hypo-osmolality and hyponatremia: Secondary | ICD-10-CM | POA: Diagnosis not present

## 2021-03-19 DIAGNOSIS — I10 Essential (primary) hypertension: Secondary | ICD-10-CM | POA: Diagnosis not present

## 2021-03-19 DIAGNOSIS — L89621 Pressure ulcer of left heel, stage 1: Secondary | ICD-10-CM | POA: Diagnosis not present

## 2021-03-19 DIAGNOSIS — L89611 Pressure ulcer of right heel, stage 1: Secondary | ICD-10-CM | POA: Diagnosis not present

## 2021-03-19 DIAGNOSIS — L89322 Pressure ulcer of left buttock, stage 2: Secondary | ICD-10-CM | POA: Diagnosis not present

## 2021-03-19 DIAGNOSIS — L89312 Pressure ulcer of right buttock, stage 2: Secondary | ICD-10-CM | POA: Diagnosis not present

## 2021-03-19 DIAGNOSIS — I499 Cardiac arrhythmia, unspecified: Secondary | ICD-10-CM | POA: Diagnosis not present

## 2021-03-19 DIAGNOSIS — K219 Gastro-esophageal reflux disease without esophagitis: Secondary | ICD-10-CM | POA: Diagnosis not present

## 2021-03-19 DIAGNOSIS — I081 Rheumatic disorders of both mitral and tricuspid valves: Secondary | ICD-10-CM | POA: Diagnosis not present

## 2021-03-19 DIAGNOSIS — F0390 Unspecified dementia without behavioral disturbance: Secondary | ICD-10-CM | POA: Diagnosis not present

## 2021-03-19 DIAGNOSIS — I129 Hypertensive chronic kidney disease with stage 1 through stage 4 chronic kidney disease, or unspecified chronic kidney disease: Secondary | ICD-10-CM | POA: Diagnosis not present

## 2021-03-19 DIAGNOSIS — N189 Chronic kidney disease, unspecified: Secondary | ICD-10-CM | POA: Diagnosis not present

## 2021-03-21 DIAGNOSIS — E871 Hypo-osmolality and hyponatremia: Secondary | ICD-10-CM | POA: Diagnosis not present

## 2021-03-21 DIAGNOSIS — L89312 Pressure ulcer of right buttock, stage 2: Secondary | ICD-10-CM | POA: Diagnosis not present

## 2021-03-21 DIAGNOSIS — I499 Cardiac arrhythmia, unspecified: Secondary | ICD-10-CM | POA: Diagnosis not present

## 2021-03-21 DIAGNOSIS — I129 Hypertensive chronic kidney disease with stage 1 through stage 4 chronic kidney disease, or unspecified chronic kidney disease: Secondary | ICD-10-CM | POA: Diagnosis not present

## 2021-03-21 DIAGNOSIS — L89611 Pressure ulcer of right heel, stage 1: Secondary | ICD-10-CM | POA: Diagnosis not present

## 2021-03-21 DIAGNOSIS — L89322 Pressure ulcer of left buttock, stage 2: Secondary | ICD-10-CM | POA: Diagnosis not present

## 2021-03-21 DIAGNOSIS — I081 Rheumatic disorders of both mitral and tricuspid valves: Secondary | ICD-10-CM | POA: Diagnosis not present

## 2021-03-21 DIAGNOSIS — L89621 Pressure ulcer of left heel, stage 1: Secondary | ICD-10-CM | POA: Diagnosis not present

## 2021-03-21 DIAGNOSIS — N189 Chronic kidney disease, unspecified: Secondary | ICD-10-CM | POA: Diagnosis not present

## 2021-03-23 DIAGNOSIS — L89312 Pressure ulcer of right buttock, stage 2: Secondary | ICD-10-CM | POA: Diagnosis not present

## 2021-03-23 DIAGNOSIS — I499 Cardiac arrhythmia, unspecified: Secondary | ICD-10-CM | POA: Diagnosis not present

## 2021-03-23 DIAGNOSIS — I081 Rheumatic disorders of both mitral and tricuspid valves: Secondary | ICD-10-CM | POA: Diagnosis not present

## 2021-03-23 DIAGNOSIS — L89611 Pressure ulcer of right heel, stage 1: Secondary | ICD-10-CM | POA: Diagnosis not present

## 2021-03-23 DIAGNOSIS — L89621 Pressure ulcer of left heel, stage 1: Secondary | ICD-10-CM | POA: Diagnosis not present

## 2021-03-23 DIAGNOSIS — E871 Hypo-osmolality and hyponatremia: Secondary | ICD-10-CM | POA: Diagnosis not present

## 2021-03-23 DIAGNOSIS — N189 Chronic kidney disease, unspecified: Secondary | ICD-10-CM | POA: Diagnosis not present

## 2021-03-23 DIAGNOSIS — I129 Hypertensive chronic kidney disease with stage 1 through stage 4 chronic kidney disease, or unspecified chronic kidney disease: Secondary | ICD-10-CM | POA: Diagnosis not present

## 2021-03-23 DIAGNOSIS — L89322 Pressure ulcer of left buttock, stage 2: Secondary | ICD-10-CM | POA: Diagnosis not present

## 2021-03-25 DIAGNOSIS — L89312 Pressure ulcer of right buttock, stage 2: Secondary | ICD-10-CM | POA: Diagnosis not present

## 2021-03-25 DIAGNOSIS — L89611 Pressure ulcer of right heel, stage 1: Secondary | ICD-10-CM | POA: Diagnosis not present

## 2021-03-25 DIAGNOSIS — L89322 Pressure ulcer of left buttock, stage 2: Secondary | ICD-10-CM | POA: Diagnosis not present

## 2021-03-25 DIAGNOSIS — I081 Rheumatic disorders of both mitral and tricuspid valves: Secondary | ICD-10-CM | POA: Diagnosis not present

## 2021-03-25 DIAGNOSIS — I499 Cardiac arrhythmia, unspecified: Secondary | ICD-10-CM | POA: Diagnosis not present

## 2021-03-25 DIAGNOSIS — L89621 Pressure ulcer of left heel, stage 1: Secondary | ICD-10-CM | POA: Diagnosis not present

## 2021-03-25 DIAGNOSIS — N189 Chronic kidney disease, unspecified: Secondary | ICD-10-CM | POA: Diagnosis not present

## 2021-03-25 DIAGNOSIS — I129 Hypertensive chronic kidney disease with stage 1 through stage 4 chronic kidney disease, or unspecified chronic kidney disease: Secondary | ICD-10-CM | POA: Diagnosis not present

## 2021-03-25 DIAGNOSIS — E871 Hypo-osmolality and hyponatremia: Secondary | ICD-10-CM | POA: Diagnosis not present

## 2021-03-27 DIAGNOSIS — I081 Rheumatic disorders of both mitral and tricuspid valves: Secondary | ICD-10-CM | POA: Diagnosis not present

## 2021-03-27 DIAGNOSIS — N189 Chronic kidney disease, unspecified: Secondary | ICD-10-CM | POA: Diagnosis not present

## 2021-03-27 DIAGNOSIS — E871 Hypo-osmolality and hyponatremia: Secondary | ICD-10-CM | POA: Diagnosis not present

## 2021-03-27 DIAGNOSIS — L89312 Pressure ulcer of right buttock, stage 2: Secondary | ICD-10-CM | POA: Diagnosis not present

## 2021-03-27 DIAGNOSIS — L89611 Pressure ulcer of right heel, stage 1: Secondary | ICD-10-CM | POA: Diagnosis not present

## 2021-03-27 DIAGNOSIS — L89621 Pressure ulcer of left heel, stage 1: Secondary | ICD-10-CM | POA: Diagnosis not present

## 2021-03-27 DIAGNOSIS — I129 Hypertensive chronic kidney disease with stage 1 through stage 4 chronic kidney disease, or unspecified chronic kidney disease: Secondary | ICD-10-CM | POA: Diagnosis not present

## 2021-03-27 DIAGNOSIS — L89322 Pressure ulcer of left buttock, stage 2: Secondary | ICD-10-CM | POA: Diagnosis not present

## 2021-03-27 DIAGNOSIS — I499 Cardiac arrhythmia, unspecified: Secondary | ICD-10-CM | POA: Diagnosis not present

## 2021-03-28 DIAGNOSIS — I499 Cardiac arrhythmia, unspecified: Secondary | ICD-10-CM | POA: Diagnosis not present

## 2021-03-28 DIAGNOSIS — L89611 Pressure ulcer of right heel, stage 1: Secondary | ICD-10-CM | POA: Diagnosis not present

## 2021-03-28 DIAGNOSIS — L89322 Pressure ulcer of left buttock, stage 2: Secondary | ICD-10-CM | POA: Diagnosis not present

## 2021-03-28 DIAGNOSIS — N189 Chronic kidney disease, unspecified: Secondary | ICD-10-CM | POA: Diagnosis not present

## 2021-03-28 DIAGNOSIS — L89621 Pressure ulcer of left heel, stage 1: Secondary | ICD-10-CM | POA: Diagnosis not present

## 2021-03-28 DIAGNOSIS — I129 Hypertensive chronic kidney disease with stage 1 through stage 4 chronic kidney disease, or unspecified chronic kidney disease: Secondary | ICD-10-CM | POA: Diagnosis not present

## 2021-03-28 DIAGNOSIS — I081 Rheumatic disorders of both mitral and tricuspid valves: Secondary | ICD-10-CM | POA: Diagnosis not present

## 2021-03-28 DIAGNOSIS — E871 Hypo-osmolality and hyponatremia: Secondary | ICD-10-CM | POA: Diagnosis not present

## 2021-03-28 DIAGNOSIS — L89312 Pressure ulcer of right buttock, stage 2: Secondary | ICD-10-CM | POA: Diagnosis not present

## 2021-03-29 DIAGNOSIS — L89611 Pressure ulcer of right heel, stage 1: Secondary | ICD-10-CM | POA: Diagnosis not present

## 2021-03-29 DIAGNOSIS — L89621 Pressure ulcer of left heel, stage 1: Secondary | ICD-10-CM | POA: Diagnosis not present

## 2021-03-29 DIAGNOSIS — I081 Rheumatic disorders of both mitral and tricuspid valves: Secondary | ICD-10-CM | POA: Diagnosis not present

## 2021-03-29 DIAGNOSIS — E871 Hypo-osmolality and hyponatremia: Secondary | ICD-10-CM | POA: Diagnosis not present

## 2021-03-29 DIAGNOSIS — I129 Hypertensive chronic kidney disease with stage 1 through stage 4 chronic kidney disease, or unspecified chronic kidney disease: Secondary | ICD-10-CM | POA: Diagnosis not present

## 2021-03-29 DIAGNOSIS — L89312 Pressure ulcer of right buttock, stage 2: Secondary | ICD-10-CM | POA: Diagnosis not present

## 2021-03-29 DIAGNOSIS — I499 Cardiac arrhythmia, unspecified: Secondary | ICD-10-CM | POA: Diagnosis not present

## 2021-03-29 DIAGNOSIS — L89322 Pressure ulcer of left buttock, stage 2: Secondary | ICD-10-CM | POA: Diagnosis not present

## 2021-03-29 DIAGNOSIS — N189 Chronic kidney disease, unspecified: Secondary | ICD-10-CM | POA: Diagnosis not present

## 2021-04-02 DIAGNOSIS — I129 Hypertensive chronic kidney disease with stage 1 through stage 4 chronic kidney disease, or unspecified chronic kidney disease: Secondary | ICD-10-CM | POA: Diagnosis not present

## 2021-04-02 DIAGNOSIS — I081 Rheumatic disorders of both mitral and tricuspid valves: Secondary | ICD-10-CM | POA: Diagnosis not present

## 2021-04-02 DIAGNOSIS — I499 Cardiac arrhythmia, unspecified: Secondary | ICD-10-CM | POA: Diagnosis not present

## 2021-04-02 DIAGNOSIS — L89322 Pressure ulcer of left buttock, stage 2: Secondary | ICD-10-CM | POA: Diagnosis not present

## 2021-04-02 DIAGNOSIS — E871 Hypo-osmolality and hyponatremia: Secondary | ICD-10-CM | POA: Diagnosis not present

## 2021-04-02 DIAGNOSIS — N189 Chronic kidney disease, unspecified: Secondary | ICD-10-CM | POA: Diagnosis not present

## 2021-04-02 DIAGNOSIS — L89621 Pressure ulcer of left heel, stage 1: Secondary | ICD-10-CM | POA: Diagnosis not present

## 2021-04-02 DIAGNOSIS — I10 Essential (primary) hypertension: Secondary | ICD-10-CM | POA: Diagnosis not present

## 2021-04-02 DIAGNOSIS — L89611 Pressure ulcer of right heel, stage 1: Secondary | ICD-10-CM | POA: Diagnosis not present

## 2021-04-02 DIAGNOSIS — L89312 Pressure ulcer of right buttock, stage 2: Secondary | ICD-10-CM | POA: Diagnosis not present

## 2021-04-02 DIAGNOSIS — F0390 Unspecified dementia without behavioral disturbance: Secondary | ICD-10-CM | POA: Diagnosis not present

## 2021-04-03 DIAGNOSIS — I499 Cardiac arrhythmia, unspecified: Secondary | ICD-10-CM | POA: Diagnosis not present

## 2021-04-03 DIAGNOSIS — L89621 Pressure ulcer of left heel, stage 1: Secondary | ICD-10-CM | POA: Diagnosis not present

## 2021-04-03 DIAGNOSIS — N189 Chronic kidney disease, unspecified: Secondary | ICD-10-CM | POA: Diagnosis not present

## 2021-04-03 DIAGNOSIS — E871 Hypo-osmolality and hyponatremia: Secondary | ICD-10-CM | POA: Diagnosis not present

## 2021-04-03 DIAGNOSIS — F0390 Unspecified dementia without behavioral disturbance: Secondary | ICD-10-CM | POA: Diagnosis not present

## 2021-04-03 DIAGNOSIS — L89312 Pressure ulcer of right buttock, stage 2: Secondary | ICD-10-CM | POA: Diagnosis not present

## 2021-04-03 DIAGNOSIS — I081 Rheumatic disorders of both mitral and tricuspid valves: Secondary | ICD-10-CM | POA: Diagnosis not present

## 2021-04-03 DIAGNOSIS — L89611 Pressure ulcer of right heel, stage 1: Secondary | ICD-10-CM | POA: Diagnosis not present

## 2021-04-03 DIAGNOSIS — L89322 Pressure ulcer of left buttock, stage 2: Secondary | ICD-10-CM | POA: Diagnosis not present

## 2021-04-03 DIAGNOSIS — I129 Hypertensive chronic kidney disease with stage 1 through stage 4 chronic kidney disease, or unspecified chronic kidney disease: Secondary | ICD-10-CM | POA: Diagnosis not present

## 2021-04-04 ENCOUNTER — Telehealth: Payer: Self-pay | Admitting: Gastroenterology

## 2021-04-04 NOTE — Telephone Encounter (Signed)
Hey Dr. Fuller Plan,  [place] call from [place] at [facility]. We received a referral for transfer of care for colitis. Patient was previous seen by a GI in [place], records are in [place]. Patient is now a resident in [place] so they want to be local. Can you please advise on scheduling?  Thank you

## 2021-04-05 ENCOUNTER — Ambulatory Visit: Payer: Medicare PPO | Admitting: Urology

## 2021-04-05 DIAGNOSIS — L89611 Pressure ulcer of right heel, stage 1: Secondary | ICD-10-CM | POA: Diagnosis not present

## 2021-04-05 DIAGNOSIS — I499 Cardiac arrhythmia, unspecified: Secondary | ICD-10-CM | POA: Diagnosis not present

## 2021-04-05 DIAGNOSIS — N3020 Other chronic cystitis without hematuria: Secondary | ICD-10-CM

## 2021-04-05 DIAGNOSIS — E871 Hypo-osmolality and hyponatremia: Secondary | ICD-10-CM | POA: Diagnosis not present

## 2021-04-05 DIAGNOSIS — N189 Chronic kidney disease, unspecified: Secondary | ICD-10-CM | POA: Diagnosis not present

## 2021-04-05 DIAGNOSIS — L89312 Pressure ulcer of right buttock, stage 2: Secondary | ICD-10-CM | POA: Diagnosis not present

## 2021-04-05 DIAGNOSIS — L89322 Pressure ulcer of left buttock, stage 2: Secondary | ICD-10-CM | POA: Diagnosis not present

## 2021-04-05 DIAGNOSIS — I129 Hypertensive chronic kidney disease with stage 1 through stage 4 chronic kidney disease, or unspecified chronic kidney disease: Secondary | ICD-10-CM | POA: Diagnosis not present

## 2021-04-05 DIAGNOSIS — I081 Rheumatic disorders of both mitral and tricuspid valves: Secondary | ICD-10-CM | POA: Diagnosis not present

## 2021-04-05 DIAGNOSIS — L89621 Pressure ulcer of left heel, stage 1: Secondary | ICD-10-CM | POA: Diagnosis not present

## 2021-04-05 NOTE — Telephone Encounter (Signed)
I am unable to accommodate a transfer of care.

## 2021-04-08 DIAGNOSIS — L89322 Pressure ulcer of left buttock, stage 2: Secondary | ICD-10-CM | POA: Diagnosis not present

## 2021-04-08 DIAGNOSIS — E871 Hypo-osmolality and hyponatremia: Secondary | ICD-10-CM | POA: Diagnosis not present

## 2021-04-08 DIAGNOSIS — L89621 Pressure ulcer of left heel, stage 1: Secondary | ICD-10-CM | POA: Diagnosis not present

## 2021-04-08 DIAGNOSIS — L89312 Pressure ulcer of right buttock, stage 2: Secondary | ICD-10-CM | POA: Diagnosis not present

## 2021-04-08 DIAGNOSIS — I499 Cardiac arrhythmia, unspecified: Secondary | ICD-10-CM | POA: Diagnosis not present

## 2021-04-08 DIAGNOSIS — I081 Rheumatic disorders of both mitral and tricuspid valves: Secondary | ICD-10-CM | POA: Diagnosis not present

## 2021-04-08 DIAGNOSIS — N189 Chronic kidney disease, unspecified: Secondary | ICD-10-CM | POA: Diagnosis not present

## 2021-04-08 DIAGNOSIS — I129 Hypertensive chronic kidney disease with stage 1 through stage 4 chronic kidney disease, or unspecified chronic kidney disease: Secondary | ICD-10-CM | POA: Diagnosis not present

## 2021-04-08 DIAGNOSIS — L89611 Pressure ulcer of right heel, stage 1: Secondary | ICD-10-CM | POA: Diagnosis not present

## 2021-04-09 DIAGNOSIS — I081 Rheumatic disorders of both mitral and tricuspid valves: Secondary | ICD-10-CM | POA: Diagnosis not present

## 2021-04-09 DIAGNOSIS — I129 Hypertensive chronic kidney disease with stage 1 through stage 4 chronic kidney disease, or unspecified chronic kidney disease: Secondary | ICD-10-CM | POA: Diagnosis not present

## 2021-04-09 DIAGNOSIS — I10 Essential (primary) hypertension: Secondary | ICD-10-CM | POA: Diagnosis not present

## 2021-04-09 DIAGNOSIS — L89312 Pressure ulcer of right buttock, stage 2: Secondary | ICD-10-CM | POA: Diagnosis not present

## 2021-04-09 DIAGNOSIS — L89322 Pressure ulcer of left buttock, stage 2: Secondary | ICD-10-CM | POA: Diagnosis not present

## 2021-04-09 DIAGNOSIS — F0390 Unspecified dementia without behavioral disturbance: Secondary | ICD-10-CM | POA: Diagnosis not present

## 2021-04-09 DIAGNOSIS — I499 Cardiac arrhythmia, unspecified: Secondary | ICD-10-CM | POA: Diagnosis not present

## 2021-04-09 DIAGNOSIS — E871 Hypo-osmolality and hyponatremia: Secondary | ICD-10-CM | POA: Diagnosis not present

## 2021-04-09 DIAGNOSIS — L89621 Pressure ulcer of left heel, stage 1: Secondary | ICD-10-CM | POA: Diagnosis not present

## 2021-04-09 DIAGNOSIS — L89611 Pressure ulcer of right heel, stage 1: Secondary | ICD-10-CM | POA: Diagnosis not present

## 2021-04-09 DIAGNOSIS — N189 Chronic kidney disease, unspecified: Secondary | ICD-10-CM | POA: Diagnosis not present

## 2021-04-09 DIAGNOSIS — M25552 Pain in left hip: Secondary | ICD-10-CM | POA: Diagnosis not present

## 2021-04-09 NOTE — Telephone Encounter (Signed)
Spoke with patient caregiver. Advised him that doctor could not accommodate request at this time. He understood and stated would try to find another.

## 2021-04-12 DIAGNOSIS — L89322 Pressure ulcer of left buttock, stage 2: Secondary | ICD-10-CM | POA: Diagnosis not present

## 2021-04-12 DIAGNOSIS — I499 Cardiac arrhythmia, unspecified: Secondary | ICD-10-CM | POA: Diagnosis not present

## 2021-04-12 DIAGNOSIS — E871 Hypo-osmolality and hyponatremia: Secondary | ICD-10-CM | POA: Diagnosis not present

## 2021-04-12 DIAGNOSIS — L89621 Pressure ulcer of left heel, stage 1: Secondary | ICD-10-CM | POA: Diagnosis not present

## 2021-04-12 DIAGNOSIS — I081 Rheumatic disorders of both mitral and tricuspid valves: Secondary | ICD-10-CM | POA: Diagnosis not present

## 2021-04-12 DIAGNOSIS — L89611 Pressure ulcer of right heel, stage 1: Secondary | ICD-10-CM | POA: Diagnosis not present

## 2021-04-12 DIAGNOSIS — I129 Hypertensive chronic kidney disease with stage 1 through stage 4 chronic kidney disease, or unspecified chronic kidney disease: Secondary | ICD-10-CM | POA: Diagnosis not present

## 2021-04-12 DIAGNOSIS — N189 Chronic kidney disease, unspecified: Secondary | ICD-10-CM | POA: Diagnosis not present

## 2021-04-12 DIAGNOSIS — L89312 Pressure ulcer of right buttock, stage 2: Secondary | ICD-10-CM | POA: Diagnosis not present

## 2021-04-14 DIAGNOSIS — Z9181 History of falling: Secondary | ICD-10-CM | POA: Diagnosis not present

## 2021-04-14 DIAGNOSIS — L89312 Pressure ulcer of right buttock, stage 2: Secondary | ICD-10-CM | POA: Diagnosis not present

## 2021-04-14 DIAGNOSIS — M6258 Muscle wasting and atrophy, not elsewhere classified, other site: Secondary | ICD-10-CM | POA: Diagnosis not present

## 2021-04-14 DIAGNOSIS — R262 Difficulty in walking, not elsewhere classified: Secondary | ICD-10-CM | POA: Diagnosis not present

## 2021-04-15 DIAGNOSIS — E871 Hypo-osmolality and hyponatremia: Secondary | ICD-10-CM | POA: Diagnosis not present

## 2021-04-15 DIAGNOSIS — L89322 Pressure ulcer of left buttock, stage 2: Secondary | ICD-10-CM | POA: Diagnosis not present

## 2021-04-15 DIAGNOSIS — I129 Hypertensive chronic kidney disease with stage 1 through stage 4 chronic kidney disease, or unspecified chronic kidney disease: Secondary | ICD-10-CM | POA: Diagnosis not present

## 2021-04-15 DIAGNOSIS — I499 Cardiac arrhythmia, unspecified: Secondary | ICD-10-CM | POA: Diagnosis not present

## 2021-04-15 DIAGNOSIS — I081 Rheumatic disorders of both mitral and tricuspid valves: Secondary | ICD-10-CM | POA: Diagnosis not present

## 2021-04-15 DIAGNOSIS — L89611 Pressure ulcer of right heel, stage 1: Secondary | ICD-10-CM | POA: Diagnosis not present

## 2021-04-15 DIAGNOSIS — N189 Chronic kidney disease, unspecified: Secondary | ICD-10-CM | POA: Diagnosis not present

## 2021-04-15 DIAGNOSIS — L89312 Pressure ulcer of right buttock, stage 2: Secondary | ICD-10-CM | POA: Diagnosis not present

## 2021-04-15 DIAGNOSIS — L89621 Pressure ulcer of left heel, stage 1: Secondary | ICD-10-CM | POA: Diagnosis not present

## 2021-04-17 DIAGNOSIS — L89621 Pressure ulcer of left heel, stage 1: Secondary | ICD-10-CM | POA: Diagnosis not present

## 2021-04-17 DIAGNOSIS — I081 Rheumatic disorders of both mitral and tricuspid valves: Secondary | ICD-10-CM | POA: Diagnosis not present

## 2021-04-17 DIAGNOSIS — N189 Chronic kidney disease, unspecified: Secondary | ICD-10-CM | POA: Diagnosis not present

## 2021-04-17 DIAGNOSIS — E871 Hypo-osmolality and hyponatremia: Secondary | ICD-10-CM | POA: Diagnosis not present

## 2021-04-17 DIAGNOSIS — L89322 Pressure ulcer of left buttock, stage 2: Secondary | ICD-10-CM | POA: Diagnosis not present

## 2021-04-17 DIAGNOSIS — L89611 Pressure ulcer of right heel, stage 1: Secondary | ICD-10-CM | POA: Diagnosis not present

## 2021-04-17 DIAGNOSIS — I499 Cardiac arrhythmia, unspecified: Secondary | ICD-10-CM | POA: Diagnosis not present

## 2021-04-17 DIAGNOSIS — L89312 Pressure ulcer of right buttock, stage 2: Secondary | ICD-10-CM | POA: Diagnosis not present

## 2021-04-17 DIAGNOSIS — I129 Hypertensive chronic kidney disease with stage 1 through stage 4 chronic kidney disease, or unspecified chronic kidney disease: Secondary | ICD-10-CM | POA: Diagnosis not present

## 2021-04-29 DIAGNOSIS — L89322 Pressure ulcer of left buttock, stage 2: Secondary | ICD-10-CM | POA: Diagnosis not present

## 2021-04-29 DIAGNOSIS — L89312 Pressure ulcer of right buttock, stage 2: Secondary | ICD-10-CM | POA: Diagnosis not present

## 2021-04-29 DIAGNOSIS — I499 Cardiac arrhythmia, unspecified: Secondary | ICD-10-CM | POA: Diagnosis not present

## 2021-04-29 DIAGNOSIS — I129 Hypertensive chronic kidney disease with stage 1 through stage 4 chronic kidney disease, or unspecified chronic kidney disease: Secondary | ICD-10-CM | POA: Diagnosis not present

## 2021-04-29 DIAGNOSIS — I081 Rheumatic disorders of both mitral and tricuspid valves: Secondary | ICD-10-CM | POA: Diagnosis not present

## 2021-04-29 DIAGNOSIS — N189 Chronic kidney disease, unspecified: Secondary | ICD-10-CM | POA: Diagnosis not present

## 2021-04-29 DIAGNOSIS — L89621 Pressure ulcer of left heel, stage 1: Secondary | ICD-10-CM | POA: Diagnosis not present

## 2021-04-29 DIAGNOSIS — E871 Hypo-osmolality and hyponatremia: Secondary | ICD-10-CM | POA: Diagnosis not present

## 2021-04-29 DIAGNOSIS — L89611 Pressure ulcer of right heel, stage 1: Secondary | ICD-10-CM | POA: Diagnosis not present

## 2021-04-30 ENCOUNTER — Ambulatory Visit (INDEPENDENT_AMBULATORY_CARE_PROVIDER_SITE_OTHER): Payer: Medicare PPO | Admitting: Internal Medicine

## 2021-05-01 ENCOUNTER — Encounter (INDEPENDENT_AMBULATORY_CARE_PROVIDER_SITE_OTHER): Payer: Self-pay | Admitting: Internal Medicine

## 2021-05-07 DIAGNOSIS — F0390 Unspecified dementia without behavioral disturbance: Secondary | ICD-10-CM | POA: Diagnosis not present

## 2021-05-07 DIAGNOSIS — I10 Essential (primary) hypertension: Secondary | ICD-10-CM | POA: Diagnosis not present

## 2021-05-07 DIAGNOSIS — K219 Gastro-esophageal reflux disease without esophagitis: Secondary | ICD-10-CM | POA: Diagnosis not present

## 2021-05-13 DIAGNOSIS — L89611 Pressure ulcer of right heel, stage 1: Secondary | ICD-10-CM | POA: Diagnosis not present

## 2021-05-13 DIAGNOSIS — L89621 Pressure ulcer of left heel, stage 1: Secondary | ICD-10-CM | POA: Diagnosis not present

## 2021-05-13 DIAGNOSIS — I499 Cardiac arrhythmia, unspecified: Secondary | ICD-10-CM | POA: Diagnosis not present

## 2021-05-13 DIAGNOSIS — N189 Chronic kidney disease, unspecified: Secondary | ICD-10-CM | POA: Diagnosis not present

## 2021-05-13 DIAGNOSIS — L89322 Pressure ulcer of left buttock, stage 2: Secondary | ICD-10-CM | POA: Diagnosis not present

## 2021-05-13 DIAGNOSIS — I129 Hypertensive chronic kidney disease with stage 1 through stage 4 chronic kidney disease, or unspecified chronic kidney disease: Secondary | ICD-10-CM | POA: Diagnosis not present

## 2021-05-13 DIAGNOSIS — E871 Hypo-osmolality and hyponatremia: Secondary | ICD-10-CM | POA: Diagnosis not present

## 2021-05-13 DIAGNOSIS — I081 Rheumatic disorders of both mitral and tricuspid valves: Secondary | ICD-10-CM | POA: Diagnosis not present

## 2021-05-13 DIAGNOSIS — L89312 Pressure ulcer of right buttock, stage 2: Secondary | ICD-10-CM | POA: Diagnosis not present

## 2021-05-14 DIAGNOSIS — L89312 Pressure ulcer of right buttock, stage 2: Secondary | ICD-10-CM | POA: Diagnosis not present

## 2021-05-14 DIAGNOSIS — M6258 Muscle wasting and atrophy, not elsewhere classified, other site: Secondary | ICD-10-CM | POA: Diagnosis not present

## 2021-05-14 DIAGNOSIS — R262 Difficulty in walking, not elsewhere classified: Secondary | ICD-10-CM | POA: Diagnosis not present

## 2021-05-14 DIAGNOSIS — Z9181 History of falling: Secondary | ICD-10-CM | POA: Diagnosis not present

## 2021-05-21 DIAGNOSIS — L89322 Pressure ulcer of left buttock, stage 2: Secondary | ICD-10-CM | POA: Diagnosis not present

## 2021-05-21 DIAGNOSIS — Z7901 Long term (current) use of anticoagulants: Secondary | ICD-10-CM | POA: Diagnosis not present

## 2021-05-21 DIAGNOSIS — F0390 Unspecified dementia without behavioral disturbance: Secondary | ICD-10-CM | POA: Diagnosis not present

## 2021-05-21 DIAGNOSIS — Z9181 History of falling: Secondary | ICD-10-CM | POA: Diagnosis not present

## 2021-05-21 DIAGNOSIS — L89312 Pressure ulcer of right buttock, stage 2: Secondary | ICD-10-CM | POA: Diagnosis not present

## 2021-05-21 DIAGNOSIS — K219 Gastro-esophageal reflux disease without esophagitis: Secondary | ICD-10-CM | POA: Diagnosis not present

## 2021-05-21 DIAGNOSIS — N3020 Other chronic cystitis without hematuria: Secondary | ICD-10-CM | POA: Diagnosis not present

## 2021-05-21 DIAGNOSIS — R32 Unspecified urinary incontinence: Secondary | ICD-10-CM | POA: Diagnosis not present

## 2021-05-21 DIAGNOSIS — K5100 Ulcerative (chronic) pancolitis without complications: Secondary | ICD-10-CM | POA: Diagnosis not present

## 2021-05-23 DIAGNOSIS — L89152 Pressure ulcer of sacral region, stage 2: Secondary | ICD-10-CM | POA: Diagnosis not present

## 2021-05-23 DIAGNOSIS — R2681 Unsteadiness on feet: Secondary | ICD-10-CM | POA: Diagnosis not present

## 2021-05-23 DIAGNOSIS — I10 Essential (primary) hypertension: Secondary | ICD-10-CM | POA: Diagnosis not present

## 2021-05-23 DIAGNOSIS — F0390 Unspecified dementia without behavioral disturbance: Secondary | ICD-10-CM | POA: Diagnosis not present

## 2021-05-23 DIAGNOSIS — M79672 Pain in left foot: Secondary | ICD-10-CM | POA: Diagnosis not present

## 2021-05-24 DIAGNOSIS — Z7901 Long term (current) use of anticoagulants: Secondary | ICD-10-CM | POA: Diagnosis not present

## 2021-05-24 DIAGNOSIS — N3020 Other chronic cystitis without hematuria: Secondary | ICD-10-CM | POA: Diagnosis not present

## 2021-05-24 DIAGNOSIS — Z9181 History of falling: Secondary | ICD-10-CM | POA: Diagnosis not present

## 2021-05-24 DIAGNOSIS — R32 Unspecified urinary incontinence: Secondary | ICD-10-CM | POA: Diagnosis not present

## 2021-05-24 DIAGNOSIS — F0390 Unspecified dementia without behavioral disturbance: Secondary | ICD-10-CM | POA: Diagnosis not present

## 2021-05-24 DIAGNOSIS — K219 Gastro-esophageal reflux disease without esophagitis: Secondary | ICD-10-CM | POA: Diagnosis not present

## 2021-05-24 DIAGNOSIS — L89322 Pressure ulcer of left buttock, stage 2: Secondary | ICD-10-CM | POA: Diagnosis not present

## 2021-05-24 DIAGNOSIS — K5100 Ulcerative (chronic) pancolitis without complications: Secondary | ICD-10-CM | POA: Diagnosis not present

## 2021-05-24 DIAGNOSIS — L89312 Pressure ulcer of right buttock, stage 2: Secondary | ICD-10-CM | POA: Diagnosis not present

## 2021-05-28 DIAGNOSIS — L89322 Pressure ulcer of left buttock, stage 2: Secondary | ICD-10-CM | POA: Diagnosis not present

## 2021-05-28 DIAGNOSIS — K5100 Ulcerative (chronic) pancolitis without complications: Secondary | ICD-10-CM | POA: Diagnosis not present

## 2021-05-28 DIAGNOSIS — N3020 Other chronic cystitis without hematuria: Secondary | ICD-10-CM | POA: Diagnosis not present

## 2021-05-28 DIAGNOSIS — R32 Unspecified urinary incontinence: Secondary | ICD-10-CM | POA: Diagnosis not present

## 2021-05-28 DIAGNOSIS — Z9181 History of falling: Secondary | ICD-10-CM | POA: Diagnosis not present

## 2021-05-28 DIAGNOSIS — F0390 Unspecified dementia without behavioral disturbance: Secondary | ICD-10-CM | POA: Diagnosis not present

## 2021-05-28 DIAGNOSIS — Z7901 Long term (current) use of anticoagulants: Secondary | ICD-10-CM | POA: Diagnosis not present

## 2021-05-28 DIAGNOSIS — K219 Gastro-esophageal reflux disease without esophagitis: Secondary | ICD-10-CM | POA: Diagnosis not present

## 2021-05-28 DIAGNOSIS — L89312 Pressure ulcer of right buttock, stage 2: Secondary | ICD-10-CM | POA: Diagnosis not present

## 2021-05-31 DIAGNOSIS — K219 Gastro-esophageal reflux disease without esophagitis: Secondary | ICD-10-CM | POA: Diagnosis not present

## 2021-05-31 DIAGNOSIS — L89312 Pressure ulcer of right buttock, stage 2: Secondary | ICD-10-CM | POA: Diagnosis not present

## 2021-05-31 DIAGNOSIS — Z7901 Long term (current) use of anticoagulants: Secondary | ICD-10-CM | POA: Diagnosis not present

## 2021-05-31 DIAGNOSIS — F0390 Unspecified dementia without behavioral disturbance: Secondary | ICD-10-CM | POA: Diagnosis not present

## 2021-05-31 DIAGNOSIS — N3020 Other chronic cystitis without hematuria: Secondary | ICD-10-CM | POA: Diagnosis not present

## 2021-05-31 DIAGNOSIS — L89322 Pressure ulcer of left buttock, stage 2: Secondary | ICD-10-CM | POA: Diagnosis not present

## 2021-05-31 DIAGNOSIS — R32 Unspecified urinary incontinence: Secondary | ICD-10-CM | POA: Diagnosis not present

## 2021-05-31 DIAGNOSIS — K5100 Ulcerative (chronic) pancolitis without complications: Secondary | ICD-10-CM | POA: Diagnosis not present

## 2021-05-31 DIAGNOSIS — Z9181 History of falling: Secondary | ICD-10-CM | POA: Diagnosis not present

## 2021-06-03 DIAGNOSIS — L89312 Pressure ulcer of right buttock, stage 2: Secondary | ICD-10-CM | POA: Diagnosis not present

## 2021-06-03 DIAGNOSIS — F0390 Unspecified dementia without behavioral disturbance: Secondary | ICD-10-CM | POA: Diagnosis not present

## 2021-06-03 DIAGNOSIS — L89322 Pressure ulcer of left buttock, stage 2: Secondary | ICD-10-CM | POA: Diagnosis not present

## 2021-06-04 DIAGNOSIS — K5100 Ulcerative (chronic) pancolitis without complications: Secondary | ICD-10-CM | POA: Diagnosis not present

## 2021-06-04 DIAGNOSIS — F0390 Unspecified dementia without behavioral disturbance: Secondary | ICD-10-CM | POA: Diagnosis not present

## 2021-06-04 DIAGNOSIS — L89312 Pressure ulcer of right buttock, stage 2: Secondary | ICD-10-CM | POA: Diagnosis not present

## 2021-06-04 DIAGNOSIS — L89322 Pressure ulcer of left buttock, stage 2: Secondary | ICD-10-CM | POA: Diagnosis not present

## 2021-06-04 DIAGNOSIS — Z9181 History of falling: Secondary | ICD-10-CM | POA: Diagnosis not present

## 2021-06-04 DIAGNOSIS — N3020 Other chronic cystitis without hematuria: Secondary | ICD-10-CM | POA: Diagnosis not present

## 2021-06-04 DIAGNOSIS — Z7901 Long term (current) use of anticoagulants: Secondary | ICD-10-CM | POA: Diagnosis not present

## 2021-06-04 DIAGNOSIS — K219 Gastro-esophageal reflux disease without esophagitis: Secondary | ICD-10-CM | POA: Diagnosis not present

## 2021-06-04 DIAGNOSIS — R32 Unspecified urinary incontinence: Secondary | ICD-10-CM | POA: Diagnosis not present

## 2021-06-07 DIAGNOSIS — N3020 Other chronic cystitis without hematuria: Secondary | ICD-10-CM | POA: Diagnosis not present

## 2021-06-07 DIAGNOSIS — K219 Gastro-esophageal reflux disease without esophagitis: Secondary | ICD-10-CM | POA: Diagnosis not present

## 2021-06-07 DIAGNOSIS — Z7901 Long term (current) use of anticoagulants: Secondary | ICD-10-CM | POA: Diagnosis not present

## 2021-06-07 DIAGNOSIS — L89322 Pressure ulcer of left buttock, stage 2: Secondary | ICD-10-CM | POA: Diagnosis not present

## 2021-06-07 DIAGNOSIS — Z9181 History of falling: Secondary | ICD-10-CM | POA: Diagnosis not present

## 2021-06-07 DIAGNOSIS — L89312 Pressure ulcer of right buttock, stage 2: Secondary | ICD-10-CM | POA: Diagnosis not present

## 2021-06-07 DIAGNOSIS — F0390 Unspecified dementia without behavioral disturbance: Secondary | ICD-10-CM | POA: Diagnosis not present

## 2021-06-07 DIAGNOSIS — R32 Unspecified urinary incontinence: Secondary | ICD-10-CM | POA: Diagnosis not present

## 2021-06-07 DIAGNOSIS — K5100 Ulcerative (chronic) pancolitis without complications: Secondary | ICD-10-CM | POA: Diagnosis not present

## 2021-06-12 DIAGNOSIS — L89312 Pressure ulcer of right buttock, stage 2: Secondary | ICD-10-CM | POA: Diagnosis not present

## 2021-06-12 DIAGNOSIS — R32 Unspecified urinary incontinence: Secondary | ICD-10-CM | POA: Diagnosis not present

## 2021-06-12 DIAGNOSIS — K219 Gastro-esophageal reflux disease without esophagitis: Secondary | ICD-10-CM | POA: Diagnosis not present

## 2021-06-12 DIAGNOSIS — Z9181 History of falling: Secondary | ICD-10-CM | POA: Diagnosis not present

## 2021-06-12 DIAGNOSIS — K5100 Ulcerative (chronic) pancolitis without complications: Secondary | ICD-10-CM | POA: Diagnosis not present

## 2021-06-12 DIAGNOSIS — L89322 Pressure ulcer of left buttock, stage 2: Secondary | ICD-10-CM | POA: Diagnosis not present

## 2021-06-12 DIAGNOSIS — Z7901 Long term (current) use of anticoagulants: Secondary | ICD-10-CM | POA: Diagnosis not present

## 2021-06-12 DIAGNOSIS — N3020 Other chronic cystitis without hematuria: Secondary | ICD-10-CM | POA: Diagnosis not present

## 2021-06-12 DIAGNOSIS — F0390 Unspecified dementia without behavioral disturbance: Secondary | ICD-10-CM | POA: Diagnosis not present

## 2021-06-14 DIAGNOSIS — L89322 Pressure ulcer of left buttock, stage 2: Secondary | ICD-10-CM | POA: Diagnosis not present

## 2021-06-14 DIAGNOSIS — N3020 Other chronic cystitis without hematuria: Secondary | ICD-10-CM | POA: Diagnosis not present

## 2021-06-14 DIAGNOSIS — Z7901 Long term (current) use of anticoagulants: Secondary | ICD-10-CM | POA: Diagnosis not present

## 2021-06-14 DIAGNOSIS — K219 Gastro-esophageal reflux disease without esophagitis: Secondary | ICD-10-CM | POA: Diagnosis not present

## 2021-06-14 DIAGNOSIS — K5100 Ulcerative (chronic) pancolitis without complications: Secondary | ICD-10-CM | POA: Diagnosis not present

## 2021-06-14 DIAGNOSIS — F0390 Unspecified dementia without behavioral disturbance: Secondary | ICD-10-CM | POA: Diagnosis not present

## 2021-06-14 DIAGNOSIS — L89312 Pressure ulcer of right buttock, stage 2: Secondary | ICD-10-CM | POA: Diagnosis not present

## 2021-06-14 DIAGNOSIS — Z9181 History of falling: Secondary | ICD-10-CM | POA: Diagnosis not present

## 2021-06-14 DIAGNOSIS — R32 Unspecified urinary incontinence: Secondary | ICD-10-CM | POA: Diagnosis not present

## 2021-06-18 ENCOUNTER — Ambulatory Visit: Payer: Medicare PPO | Admitting: Neurology

## 2021-06-18 DIAGNOSIS — K5100 Ulcerative (chronic) pancolitis without complications: Secondary | ICD-10-CM | POA: Diagnosis not present

## 2021-06-18 DIAGNOSIS — K219 Gastro-esophageal reflux disease without esophagitis: Secondary | ICD-10-CM | POA: Diagnosis not present

## 2021-06-18 DIAGNOSIS — L89312 Pressure ulcer of right buttock, stage 2: Secondary | ICD-10-CM | POA: Diagnosis not present

## 2021-06-18 DIAGNOSIS — Z7901 Long term (current) use of anticoagulants: Secondary | ICD-10-CM | POA: Diagnosis not present

## 2021-06-18 DIAGNOSIS — Z9181 History of falling: Secondary | ICD-10-CM | POA: Diagnosis not present

## 2021-06-18 DIAGNOSIS — L89322 Pressure ulcer of left buttock, stage 2: Secondary | ICD-10-CM | POA: Diagnosis not present

## 2021-06-18 DIAGNOSIS — F0390 Unspecified dementia without behavioral disturbance: Secondary | ICD-10-CM | POA: Diagnosis not present

## 2021-06-18 DIAGNOSIS — R32 Unspecified urinary incontinence: Secondary | ICD-10-CM | POA: Diagnosis not present

## 2021-06-18 DIAGNOSIS — N3020 Other chronic cystitis without hematuria: Secondary | ICD-10-CM | POA: Diagnosis not present

## 2021-06-20 DIAGNOSIS — F0390 Unspecified dementia without behavioral disturbance: Secondary | ICD-10-CM | POA: Diagnosis not present

## 2021-06-20 DIAGNOSIS — Z7901 Long term (current) use of anticoagulants: Secondary | ICD-10-CM | POA: Diagnosis not present

## 2021-06-20 DIAGNOSIS — R32 Unspecified urinary incontinence: Secondary | ICD-10-CM | POA: Diagnosis not present

## 2021-06-20 DIAGNOSIS — K219 Gastro-esophageal reflux disease without esophagitis: Secondary | ICD-10-CM | POA: Diagnosis not present

## 2021-06-20 DIAGNOSIS — L89322 Pressure ulcer of left buttock, stage 2: Secondary | ICD-10-CM | POA: Diagnosis not present

## 2021-06-20 DIAGNOSIS — Z9181 History of falling: Secondary | ICD-10-CM | POA: Diagnosis not present

## 2021-06-20 DIAGNOSIS — N3020 Other chronic cystitis without hematuria: Secondary | ICD-10-CM | POA: Diagnosis not present

## 2021-06-20 DIAGNOSIS — K5100 Ulcerative (chronic) pancolitis without complications: Secondary | ICD-10-CM | POA: Diagnosis not present

## 2021-06-20 DIAGNOSIS — L89312 Pressure ulcer of right buttock, stage 2: Secondary | ICD-10-CM | POA: Diagnosis not present

## 2021-06-21 DIAGNOSIS — N3020 Other chronic cystitis without hematuria: Secondary | ICD-10-CM | POA: Diagnosis not present

## 2021-06-21 DIAGNOSIS — L89312 Pressure ulcer of right buttock, stage 2: Secondary | ICD-10-CM | POA: Diagnosis not present

## 2021-06-21 DIAGNOSIS — L89322 Pressure ulcer of left buttock, stage 2: Secondary | ICD-10-CM | POA: Diagnosis not present

## 2021-06-21 DIAGNOSIS — Z7901 Long term (current) use of anticoagulants: Secondary | ICD-10-CM | POA: Diagnosis not present

## 2021-06-21 DIAGNOSIS — K5100 Ulcerative (chronic) pancolitis without complications: Secondary | ICD-10-CM | POA: Diagnosis not present

## 2021-06-21 DIAGNOSIS — R32 Unspecified urinary incontinence: Secondary | ICD-10-CM | POA: Diagnosis not present

## 2021-06-21 DIAGNOSIS — F0390 Unspecified dementia without behavioral disturbance: Secondary | ICD-10-CM | POA: Diagnosis not present

## 2021-06-21 DIAGNOSIS — Z9181 History of falling: Secondary | ICD-10-CM | POA: Diagnosis not present

## 2021-06-21 DIAGNOSIS — K219 Gastro-esophageal reflux disease without esophagitis: Secondary | ICD-10-CM | POA: Diagnosis not present

## 2021-06-24 DIAGNOSIS — L89322 Pressure ulcer of left buttock, stage 2: Secondary | ICD-10-CM | POA: Diagnosis not present

## 2021-06-24 DIAGNOSIS — N3020 Other chronic cystitis without hematuria: Secondary | ICD-10-CM | POA: Diagnosis not present

## 2021-06-24 DIAGNOSIS — R32 Unspecified urinary incontinence: Secondary | ICD-10-CM | POA: Diagnosis not present

## 2021-06-24 DIAGNOSIS — K219 Gastro-esophageal reflux disease without esophagitis: Secondary | ICD-10-CM | POA: Diagnosis not present

## 2021-06-24 DIAGNOSIS — L89312 Pressure ulcer of right buttock, stage 2: Secondary | ICD-10-CM | POA: Diagnosis not present

## 2021-06-24 DIAGNOSIS — Z7901 Long term (current) use of anticoagulants: Secondary | ICD-10-CM | POA: Diagnosis not present

## 2021-06-24 DIAGNOSIS — F0390 Unspecified dementia without behavioral disturbance: Secondary | ICD-10-CM | POA: Diagnosis not present

## 2021-06-24 DIAGNOSIS — Z9181 History of falling: Secondary | ICD-10-CM | POA: Diagnosis not present

## 2021-06-24 DIAGNOSIS — K5100 Ulcerative (chronic) pancolitis without complications: Secondary | ICD-10-CM | POA: Diagnosis not present

## 2021-06-28 DIAGNOSIS — L89312 Pressure ulcer of right buttock, stage 2: Secondary | ICD-10-CM | POA: Diagnosis not present

## 2021-06-28 DIAGNOSIS — L89322 Pressure ulcer of left buttock, stage 2: Secondary | ICD-10-CM | POA: Diagnosis not present

## 2021-06-28 DIAGNOSIS — N3020 Other chronic cystitis without hematuria: Secondary | ICD-10-CM | POA: Diagnosis not present

## 2021-06-28 DIAGNOSIS — F0390 Unspecified dementia without behavioral disturbance: Secondary | ICD-10-CM | POA: Diagnosis not present

## 2021-06-28 DIAGNOSIS — R32 Unspecified urinary incontinence: Secondary | ICD-10-CM | POA: Diagnosis not present

## 2021-06-28 DIAGNOSIS — K219 Gastro-esophageal reflux disease without esophagitis: Secondary | ICD-10-CM | POA: Diagnosis not present

## 2021-06-28 DIAGNOSIS — Z9181 History of falling: Secondary | ICD-10-CM | POA: Diagnosis not present

## 2021-06-28 DIAGNOSIS — Z7901 Long term (current) use of anticoagulants: Secondary | ICD-10-CM | POA: Diagnosis not present

## 2021-06-28 DIAGNOSIS — K5100 Ulcerative (chronic) pancolitis without complications: Secondary | ICD-10-CM | POA: Diagnosis not present

## 2021-07-02 DIAGNOSIS — K5100 Ulcerative (chronic) pancolitis without complications: Secondary | ICD-10-CM | POA: Diagnosis not present

## 2021-07-02 DIAGNOSIS — Z9181 History of falling: Secondary | ICD-10-CM | POA: Diagnosis not present

## 2021-07-02 DIAGNOSIS — N3020 Other chronic cystitis without hematuria: Secondary | ICD-10-CM | POA: Diagnosis not present

## 2021-07-02 DIAGNOSIS — F0390 Unspecified dementia without behavioral disturbance: Secondary | ICD-10-CM | POA: Diagnosis not present

## 2021-07-02 DIAGNOSIS — K219 Gastro-esophageal reflux disease without esophagitis: Secondary | ICD-10-CM | POA: Diagnosis not present

## 2021-07-02 DIAGNOSIS — Z7901 Long term (current) use of anticoagulants: Secondary | ICD-10-CM | POA: Diagnosis not present

## 2021-07-02 DIAGNOSIS — L89322 Pressure ulcer of left buttock, stage 2: Secondary | ICD-10-CM | POA: Diagnosis not present

## 2021-07-02 DIAGNOSIS — L89312 Pressure ulcer of right buttock, stage 2: Secondary | ICD-10-CM | POA: Diagnosis not present

## 2021-07-02 DIAGNOSIS — R32 Unspecified urinary incontinence: Secondary | ICD-10-CM | POA: Diagnosis not present

## 2021-07-09 DIAGNOSIS — Z9181 History of falling: Secondary | ICD-10-CM | POA: Diagnosis not present

## 2021-07-09 DIAGNOSIS — K5100 Ulcerative (chronic) pancolitis without complications: Secondary | ICD-10-CM | POA: Diagnosis not present

## 2021-07-09 DIAGNOSIS — L89322 Pressure ulcer of left buttock, stage 2: Secondary | ICD-10-CM | POA: Diagnosis not present

## 2021-07-09 DIAGNOSIS — N3020 Other chronic cystitis without hematuria: Secondary | ICD-10-CM | POA: Diagnosis not present

## 2021-07-09 DIAGNOSIS — L89312 Pressure ulcer of right buttock, stage 2: Secondary | ICD-10-CM | POA: Diagnosis not present

## 2021-07-09 DIAGNOSIS — R32 Unspecified urinary incontinence: Secondary | ICD-10-CM | POA: Diagnosis not present

## 2021-07-09 DIAGNOSIS — Z7901 Long term (current) use of anticoagulants: Secondary | ICD-10-CM | POA: Diagnosis not present

## 2021-07-09 DIAGNOSIS — K219 Gastro-esophageal reflux disease without esophagitis: Secondary | ICD-10-CM | POA: Diagnosis not present

## 2021-07-09 DIAGNOSIS — F0390 Unspecified dementia without behavioral disturbance: Secondary | ICD-10-CM | POA: Diagnosis not present

## 2021-07-16 DIAGNOSIS — R32 Unspecified urinary incontinence: Secondary | ICD-10-CM | POA: Diagnosis not present

## 2021-07-16 DIAGNOSIS — N3020 Other chronic cystitis without hematuria: Secondary | ICD-10-CM | POA: Diagnosis not present

## 2021-07-16 DIAGNOSIS — K5100 Ulcerative (chronic) pancolitis without complications: Secondary | ICD-10-CM | POA: Diagnosis not present

## 2021-07-16 DIAGNOSIS — K219 Gastro-esophageal reflux disease without esophagitis: Secondary | ICD-10-CM | POA: Diagnosis not present

## 2021-07-16 DIAGNOSIS — L89322 Pressure ulcer of left buttock, stage 2: Secondary | ICD-10-CM | POA: Diagnosis not present

## 2021-07-16 DIAGNOSIS — Z9181 History of falling: Secondary | ICD-10-CM | POA: Diagnosis not present

## 2021-07-16 DIAGNOSIS — L89312 Pressure ulcer of right buttock, stage 2: Secondary | ICD-10-CM | POA: Diagnosis not present

## 2021-07-16 DIAGNOSIS — Z7901 Long term (current) use of anticoagulants: Secondary | ICD-10-CM | POA: Diagnosis not present

## 2021-07-16 DIAGNOSIS — F0390 Unspecified dementia without behavioral disturbance: Secondary | ICD-10-CM | POA: Diagnosis not present

## 2021-07-19 DIAGNOSIS — L89322 Pressure ulcer of left buttock, stage 2: Secondary | ICD-10-CM | POA: Diagnosis not present

## 2021-07-19 DIAGNOSIS — Z9181 History of falling: Secondary | ICD-10-CM | POA: Diagnosis not present

## 2021-07-19 DIAGNOSIS — K219 Gastro-esophageal reflux disease without esophagitis: Secondary | ICD-10-CM | POA: Diagnosis not present

## 2021-07-19 DIAGNOSIS — N3020 Other chronic cystitis without hematuria: Secondary | ICD-10-CM | POA: Diagnosis not present

## 2021-07-19 DIAGNOSIS — Z7901 Long term (current) use of anticoagulants: Secondary | ICD-10-CM | POA: Diagnosis not present

## 2021-07-19 DIAGNOSIS — K5100 Ulcerative (chronic) pancolitis without complications: Secondary | ICD-10-CM | POA: Diagnosis not present

## 2021-07-19 DIAGNOSIS — R32 Unspecified urinary incontinence: Secondary | ICD-10-CM | POA: Diagnosis not present

## 2021-07-19 DIAGNOSIS — F0390 Unspecified dementia without behavioral disturbance: Secondary | ICD-10-CM | POA: Diagnosis not present

## 2021-07-19 DIAGNOSIS — L89312 Pressure ulcer of right buttock, stage 2: Secondary | ICD-10-CM | POA: Diagnosis not present

## 2021-07-28 ENCOUNTER — Other Ambulatory Visit (INDEPENDENT_AMBULATORY_CARE_PROVIDER_SITE_OTHER): Payer: Self-pay | Admitting: Internal Medicine

## 2021-07-29 NOTE — Telephone Encounter (Signed)
Last seen by Dr. Laural Golden 10/30/2020 for Jerrye Bushy.

## 2021-07-30 ENCOUNTER — Telehealth (INDEPENDENT_AMBULATORY_CARE_PROVIDER_SITE_OTHER): Payer: Self-pay

## 2021-07-30 NOTE — Telephone Encounter (Signed)
Regarding the refill request from [facility] drug for Folic acid on 74/73/4037.  I called the numbers listed in the chart one was from [facility], they state the patient is no longer there she was to be transferred some where to a memory care unit, but they have no idea where this was. I also call the 623 # in chart it was discontinued. I called [facility] since they are the one that asked for the refill they state they have the same numbers listed in our records and they do not know where the request for the Folic acid had originated. Since we do not know where the patient is or if she is under another GI doctors care we have declined the rx.  Request came in from [place] for this patient per [facility] NP call and see if the patient is now under another GI office care as there was notation that the patient had moved to [place], [place].

## 2021-07-30 NOTE — Telephone Encounter (Signed)
I called the numbers listed in the chart one was from [facility], they state the patient is no longer there she was to be transferred some where to a memory care unit, but they have no idea where this was. I also call the 623 # in chart it was discontinued. I called [facility] since they are the one that asked for the refill they state they have the same numbers listed in our records and they do not know where the request for the Folic acid had originated. Since we do not know where the patient is or if she is under another GI doctors care we have declined the rx.

## 2021-08-15 DIAGNOSIS — I10 Essential (primary) hypertension: Secondary | ICD-10-CM | POA: Diagnosis not present

## 2021-08-15 DIAGNOSIS — F0390 Unspecified dementia without behavioral disturbance: Secondary | ICD-10-CM | POA: Diagnosis not present

## 2021-08-15 DIAGNOSIS — I739 Peripheral vascular disease, unspecified: Secondary | ICD-10-CM | POA: Diagnosis not present

## 2021-08-15 DIAGNOSIS — K219 Gastro-esophageal reflux disease without esophagitis: Secondary | ICD-10-CM | POA: Diagnosis not present

## 2021-08-15 DIAGNOSIS — R2681 Unsteadiness on feet: Secondary | ICD-10-CM | POA: Diagnosis not present

## 2021-08-20 DIAGNOSIS — M6281 Muscle weakness (generalized): Secondary | ICD-10-CM | POA: Diagnosis not present

## 2021-08-20 DIAGNOSIS — R2681 Unsteadiness on feet: Secondary | ICD-10-CM | POA: Diagnosis not present

## 2021-08-22 DIAGNOSIS — R2681 Unsteadiness on feet: Secondary | ICD-10-CM | POA: Diagnosis not present

## 2021-08-22 DIAGNOSIS — M6281 Muscle weakness (generalized): Secondary | ICD-10-CM | POA: Diagnosis not present

## 2021-08-23 DIAGNOSIS — R2681 Unsteadiness on feet: Secondary | ICD-10-CM | POA: Diagnosis not present

## 2021-08-23 DIAGNOSIS — M6281 Muscle weakness (generalized): Secondary | ICD-10-CM | POA: Diagnosis not present

## 2021-08-26 DIAGNOSIS — I10 Essential (primary) hypertension: Secondary | ICD-10-CM | POA: Diagnosis not present

## 2021-08-26 DIAGNOSIS — M6281 Muscle weakness (generalized): Secondary | ICD-10-CM | POA: Diagnosis not present

## 2021-08-26 DIAGNOSIS — I739 Peripheral vascular disease, unspecified: Secondary | ICD-10-CM | POA: Diagnosis not present

## 2021-08-26 DIAGNOSIS — R2681 Unsteadiness on feet: Secondary | ICD-10-CM | POA: Diagnosis not present

## 2021-08-26 DIAGNOSIS — F0390 Unspecified dementia without behavioral disturbance: Secondary | ICD-10-CM | POA: Diagnosis not present

## 2021-08-27 DIAGNOSIS — R2681 Unsteadiness on feet: Secondary | ICD-10-CM | POA: Diagnosis not present

## 2021-08-27 DIAGNOSIS — M6281 Muscle weakness (generalized): Secondary | ICD-10-CM | POA: Diagnosis not present

## 2021-08-28 DIAGNOSIS — M6281 Muscle weakness (generalized): Secondary | ICD-10-CM | POA: Diagnosis not present

## 2021-08-28 DIAGNOSIS — R2681 Unsteadiness on feet: Secondary | ICD-10-CM | POA: Diagnosis not present

## 2021-08-29 DIAGNOSIS — R300 Dysuria: Secondary | ICD-10-CM | POA: Diagnosis not present

## 2021-08-29 DIAGNOSIS — R2681 Unsteadiness on feet: Secondary | ICD-10-CM | POA: Diagnosis not present

## 2021-08-29 DIAGNOSIS — M6281 Muscle weakness (generalized): Secondary | ICD-10-CM | POA: Diagnosis not present

## 2021-08-30 DIAGNOSIS — R2681 Unsteadiness on feet: Secondary | ICD-10-CM | POA: Diagnosis not present

## 2021-08-30 DIAGNOSIS — M6281 Muscle weakness (generalized): Secondary | ICD-10-CM | POA: Diagnosis not present

## 2021-09-02 DIAGNOSIS — N390 Urinary tract infection, site not specified: Secondary | ICD-10-CM | POA: Diagnosis not present

## 2021-09-02 DIAGNOSIS — R2681 Unsteadiness on feet: Secondary | ICD-10-CM | POA: Diagnosis not present

## 2021-09-02 DIAGNOSIS — Z79899 Other long term (current) drug therapy: Secondary | ICD-10-CM | POA: Diagnosis not present

## 2021-09-02 DIAGNOSIS — M6281 Muscle weakness (generalized): Secondary | ICD-10-CM | POA: Diagnosis not present

## 2021-09-03 DIAGNOSIS — I739 Peripheral vascular disease, unspecified: Secondary | ICD-10-CM | POA: Diagnosis not present

## 2021-09-03 DIAGNOSIS — M6281 Muscle weakness (generalized): Secondary | ICD-10-CM | POA: Diagnosis not present

## 2021-09-03 DIAGNOSIS — R300 Dysuria: Secondary | ICD-10-CM | POA: Diagnosis not present

## 2021-09-03 DIAGNOSIS — R2681 Unsteadiness on feet: Secondary | ICD-10-CM | POA: Diagnosis not present

## 2021-09-03 DIAGNOSIS — I10 Essential (primary) hypertension: Secondary | ICD-10-CM | POA: Diagnosis not present

## 2021-09-03 DIAGNOSIS — F0390 Unspecified dementia without behavioral disturbance: Secondary | ICD-10-CM | POA: Diagnosis not present

## 2021-09-05 DIAGNOSIS — M6281 Muscle weakness (generalized): Secondary | ICD-10-CM | POA: Diagnosis not present

## 2021-09-05 DIAGNOSIS — N3001 Acute cystitis with hematuria: Secondary | ICD-10-CM | POA: Diagnosis not present

## 2021-09-06 DIAGNOSIS — M6281 Muscle weakness (generalized): Secondary | ICD-10-CM | POA: Diagnosis not present

## 2021-09-10 DIAGNOSIS — M6281 Muscle weakness (generalized): Secondary | ICD-10-CM | POA: Diagnosis not present

## 2021-09-11 DIAGNOSIS — M6281 Muscle weakness (generalized): Secondary | ICD-10-CM | POA: Diagnosis not present

## 2021-09-12 DIAGNOSIS — M6281 Muscle weakness (generalized): Secondary | ICD-10-CM | POA: Diagnosis not present

## 2021-09-16 DIAGNOSIS — M6281 Muscle weakness (generalized): Secondary | ICD-10-CM | POA: Diagnosis not present

## 2021-09-17 DIAGNOSIS — M6281 Muscle weakness (generalized): Secondary | ICD-10-CM | POA: Diagnosis not present

## 2021-09-18 DIAGNOSIS — M6281 Muscle weakness (generalized): Secondary | ICD-10-CM | POA: Diagnosis not present

## 2021-09-19 DIAGNOSIS — E559 Vitamin D deficiency, unspecified: Secondary | ICD-10-CM | POA: Diagnosis not present

## 2021-09-19 DIAGNOSIS — I739 Peripheral vascular disease, unspecified: Secondary | ICD-10-CM | POA: Diagnosis not present

## 2021-09-19 DIAGNOSIS — N390 Urinary tract infection, site not specified: Secondary | ICD-10-CM | POA: Diagnosis not present

## 2021-09-19 DIAGNOSIS — I10 Essential (primary) hypertension: Secondary | ICD-10-CM | POA: Diagnosis not present

## 2021-09-19 DIAGNOSIS — F0390 Unspecified dementia without behavioral disturbance: Secondary | ICD-10-CM | POA: Diagnosis not present

## 2021-09-19 DIAGNOSIS — R269 Unspecified abnormalities of gait and mobility: Secondary | ICD-10-CM | POA: Diagnosis not present

## 2021-09-19 DIAGNOSIS — K219 Gastro-esophageal reflux disease without esophagitis: Secondary | ICD-10-CM | POA: Diagnosis not present

## 2021-09-20 DIAGNOSIS — M6281 Muscle weakness (generalized): Secondary | ICD-10-CM | POA: Diagnosis not present

## 2021-09-23 DIAGNOSIS — M6281 Muscle weakness (generalized): Secondary | ICD-10-CM | POA: Diagnosis not present

## 2021-09-24 DIAGNOSIS — M6281 Muscle weakness (generalized): Secondary | ICD-10-CM | POA: Diagnosis not present

## 2021-09-28 DIAGNOSIS — M6281 Muscle weakness (generalized): Secondary | ICD-10-CM | POA: Diagnosis not present

## 2021-09-30 DIAGNOSIS — R2681 Unsteadiness on feet: Secondary | ICD-10-CM | POA: Diagnosis not present

## 2021-09-30 DIAGNOSIS — M6281 Muscle weakness (generalized): Secondary | ICD-10-CM | POA: Diagnosis not present

## 2021-10-01 DIAGNOSIS — R2681 Unsteadiness on feet: Secondary | ICD-10-CM | POA: Diagnosis not present

## 2021-10-02 DIAGNOSIS — R2681 Unsteadiness on feet: Secondary | ICD-10-CM | POA: Diagnosis not present

## 2021-10-03 DIAGNOSIS — M6281 Muscle weakness (generalized): Secondary | ICD-10-CM | POA: Diagnosis not present

## 2021-10-04 DIAGNOSIS — M6281 Muscle weakness (generalized): Secondary | ICD-10-CM | POA: Diagnosis not present

## 2021-10-07 DIAGNOSIS — M6281 Muscle weakness (generalized): Secondary | ICD-10-CM | POA: Diagnosis not present

## 2021-10-08 DIAGNOSIS — M6281 Muscle weakness (generalized): Secondary | ICD-10-CM | POA: Diagnosis not present

## 2021-10-10 DIAGNOSIS — M6281 Muscle weakness (generalized): Secondary | ICD-10-CM | POA: Diagnosis not present

## 2021-10-12 DIAGNOSIS — M6281 Muscle weakness (generalized): Secondary | ICD-10-CM | POA: Diagnosis not present

## 2021-10-14 DIAGNOSIS — M6281 Muscle weakness (generalized): Secondary | ICD-10-CM | POA: Diagnosis not present

## 2021-10-15 DIAGNOSIS — M6281 Muscle weakness (generalized): Secondary | ICD-10-CM | POA: Diagnosis not present

## 2021-10-17 DIAGNOSIS — M6281 Muscle weakness (generalized): Secondary | ICD-10-CM | POA: Diagnosis not present

## 2021-10-18 DIAGNOSIS — M6281 Muscle weakness (generalized): Secondary | ICD-10-CM | POA: Diagnosis not present

## 2021-10-21 DIAGNOSIS — M6281 Muscle weakness (generalized): Secondary | ICD-10-CM | POA: Diagnosis not present

## 2021-10-22 DIAGNOSIS — M6281 Muscle weakness (generalized): Secondary | ICD-10-CM | POA: Diagnosis not present

## 2021-10-23 DIAGNOSIS — M6281 Muscle weakness (generalized): Secondary | ICD-10-CM | POA: Diagnosis not present

## 2021-10-24 DIAGNOSIS — M6281 Muscle weakness (generalized): Secondary | ICD-10-CM | POA: Diagnosis not present

## 2021-10-28 DIAGNOSIS — R2681 Unsteadiness on feet: Secondary | ICD-10-CM | POA: Diagnosis not present

## 2021-10-28 DIAGNOSIS — M6281 Muscle weakness (generalized): Secondary | ICD-10-CM | POA: Diagnosis not present

## 2021-10-29 DIAGNOSIS — M6281 Muscle weakness (generalized): Secondary | ICD-10-CM | POA: Diagnosis not present

## 2021-10-29 DIAGNOSIS — R2681 Unsteadiness on feet: Secondary | ICD-10-CM | POA: Diagnosis not present

## 2021-10-30 DIAGNOSIS — M6281 Muscle weakness (generalized): Secondary | ICD-10-CM | POA: Diagnosis not present

## 2021-10-30 DIAGNOSIS — R2681 Unsteadiness on feet: Secondary | ICD-10-CM | POA: Diagnosis not present

## 2021-10-31 DIAGNOSIS — R2681 Unsteadiness on feet: Secondary | ICD-10-CM | POA: Diagnosis not present

## 2021-10-31 DIAGNOSIS — M6281 Muscle weakness (generalized): Secondary | ICD-10-CM | POA: Diagnosis not present

## 2021-11-04 DIAGNOSIS — M6281 Muscle weakness (generalized): Secondary | ICD-10-CM | POA: Diagnosis not present

## 2021-11-04 DIAGNOSIS — R2689 Other abnormalities of gait and mobility: Secondary | ICD-10-CM | POA: Diagnosis not present

## 2021-11-07 DIAGNOSIS — R2689 Other abnormalities of gait and mobility: Secondary | ICD-10-CM | POA: Diagnosis not present

## 2021-11-07 DIAGNOSIS — M6281 Muscle weakness (generalized): Secondary | ICD-10-CM | POA: Diagnosis not present

## 2021-11-11 DIAGNOSIS — R2689 Other abnormalities of gait and mobility: Secondary | ICD-10-CM | POA: Diagnosis not present

## 2021-11-11 DIAGNOSIS — M6281 Muscle weakness (generalized): Secondary | ICD-10-CM | POA: Diagnosis not present

## 2021-11-12 DIAGNOSIS — R2689 Other abnormalities of gait and mobility: Secondary | ICD-10-CM | POA: Diagnosis not present

## 2021-11-12 DIAGNOSIS — M6281 Muscle weakness (generalized): Secondary | ICD-10-CM | POA: Diagnosis not present

## 2021-11-13 DIAGNOSIS — M6281 Muscle weakness (generalized): Secondary | ICD-10-CM | POA: Diagnosis not present

## 2021-11-13 DIAGNOSIS — R2689 Other abnormalities of gait and mobility: Secondary | ICD-10-CM | POA: Diagnosis not present

## 2021-11-14 DIAGNOSIS — R2689 Other abnormalities of gait and mobility: Secondary | ICD-10-CM | POA: Diagnosis not present

## 2021-11-14 DIAGNOSIS — M6281 Muscle weakness (generalized): Secondary | ICD-10-CM | POA: Diagnosis not present

## 2021-11-15 DIAGNOSIS — R2689 Other abnormalities of gait and mobility: Secondary | ICD-10-CM | POA: Diagnosis not present

## 2021-11-15 DIAGNOSIS — M6281 Muscle weakness (generalized): Secondary | ICD-10-CM | POA: Diagnosis not present

## 2021-11-18 DIAGNOSIS — R2689 Other abnormalities of gait and mobility: Secondary | ICD-10-CM | POA: Diagnosis not present

## 2021-11-18 DIAGNOSIS — M6281 Muscle weakness (generalized): Secondary | ICD-10-CM | POA: Diagnosis not present

## 2021-11-19 DIAGNOSIS — R2689 Other abnormalities of gait and mobility: Secondary | ICD-10-CM | POA: Diagnosis not present

## 2021-11-19 DIAGNOSIS — M6281 Muscle weakness (generalized): Secondary | ICD-10-CM | POA: Diagnosis not present

## 2021-11-20 DIAGNOSIS — R2689 Other abnormalities of gait and mobility: Secondary | ICD-10-CM | POA: Diagnosis not present

## 2021-11-20 DIAGNOSIS — M6281 Muscle weakness (generalized): Secondary | ICD-10-CM | POA: Diagnosis not present

## 2021-11-21 DIAGNOSIS — M6281 Muscle weakness (generalized): Secondary | ICD-10-CM | POA: Diagnosis not present

## 2021-11-21 DIAGNOSIS — R2689 Other abnormalities of gait and mobility: Secondary | ICD-10-CM | POA: Diagnosis not present

## 2021-11-22 DIAGNOSIS — R2689 Other abnormalities of gait and mobility: Secondary | ICD-10-CM | POA: Diagnosis not present

## 2021-11-22 DIAGNOSIS — M6281 Muscle weakness (generalized): Secondary | ICD-10-CM | POA: Diagnosis not present

## 2021-11-25 DIAGNOSIS — R2689 Other abnormalities of gait and mobility: Secondary | ICD-10-CM | POA: Diagnosis not present

## 2021-11-25 DIAGNOSIS — M6281 Muscle weakness (generalized): Secondary | ICD-10-CM | POA: Diagnosis not present

## 2021-11-26 DIAGNOSIS — R2689 Other abnormalities of gait and mobility: Secondary | ICD-10-CM | POA: Diagnosis not present

## 2021-11-26 DIAGNOSIS — M6281 Muscle weakness (generalized): Secondary | ICD-10-CM | POA: Diagnosis not present

## 2021-11-27 DIAGNOSIS — R2689 Other abnormalities of gait and mobility: Secondary | ICD-10-CM | POA: Diagnosis not present

## 2021-11-27 DIAGNOSIS — M6281 Muscle weakness (generalized): Secondary | ICD-10-CM | POA: Diagnosis not present

## 2021-11-28 DIAGNOSIS — R2681 Unsteadiness on feet: Secondary | ICD-10-CM | POA: Diagnosis not present

## 2021-11-29 DIAGNOSIS — R2681 Unsteadiness on feet: Secondary | ICD-10-CM | POA: Diagnosis not present

## 2021-12-02 DIAGNOSIS — R2681 Unsteadiness on feet: Secondary | ICD-10-CM | POA: Diagnosis not present

## 2021-12-03 DIAGNOSIS — I129 Hypertensive chronic kidney disease with stage 1 through stage 4 chronic kidney disease, or unspecified chronic kidney disease: Secondary | ICD-10-CM | POA: Diagnosis not present

## 2021-12-03 DIAGNOSIS — F0390 Unspecified dementia without behavioral disturbance: Secondary | ICD-10-CM | POA: Diagnosis not present

## 2021-12-04 DIAGNOSIS — F0390 Unspecified dementia without behavioral disturbance: Secondary | ICD-10-CM | POA: Diagnosis not present

## 2021-12-04 DIAGNOSIS — Z9181 History of falling: Secondary | ICD-10-CM | POA: Diagnosis not present

## 2021-12-04 DIAGNOSIS — R278 Other lack of coordination: Secondary | ICD-10-CM | POA: Diagnosis not present

## 2021-12-04 DIAGNOSIS — R2681 Unsteadiness on feet: Secondary | ICD-10-CM | POA: Diagnosis not present

## 2021-12-04 DIAGNOSIS — M6281 Muscle weakness (generalized): Secondary | ICD-10-CM | POA: Diagnosis not present

## 2021-12-04 DIAGNOSIS — K51019 Ulcerative (chronic) pancolitis with unspecified complications: Secondary | ICD-10-CM | POA: Diagnosis not present

## 2021-12-04 DIAGNOSIS — I10 Essential (primary) hypertension: Secondary | ICD-10-CM | POA: Diagnosis not present

## 2021-12-04 DIAGNOSIS — R4189 Other symptoms and signs involving cognitive functions and awareness: Secondary | ICD-10-CM | POA: Diagnosis not present

## 2021-12-04 DIAGNOSIS — M25552 Pain in left hip: Secondary | ICD-10-CM | POA: Diagnosis not present

## 2021-12-05 DIAGNOSIS — R2681 Unsteadiness on feet: Secondary | ICD-10-CM | POA: Diagnosis not present

## 2021-12-05 DIAGNOSIS — M6281 Muscle weakness (generalized): Secondary | ICD-10-CM | POA: Diagnosis not present

## 2021-12-05 DIAGNOSIS — K51019 Ulcerative (chronic) pancolitis with unspecified complications: Secondary | ICD-10-CM | POA: Diagnosis not present

## 2021-12-05 DIAGNOSIS — I10 Essential (primary) hypertension: Secondary | ICD-10-CM | POA: Diagnosis not present

## 2021-12-05 DIAGNOSIS — F0390 Unspecified dementia without behavioral disturbance: Secondary | ICD-10-CM | POA: Diagnosis not present

## 2021-12-05 DIAGNOSIS — Z9181 History of falling: Secondary | ICD-10-CM | POA: Diagnosis not present

## 2021-12-05 DIAGNOSIS — M25552 Pain in left hip: Secondary | ICD-10-CM | POA: Diagnosis not present

## 2021-12-05 DIAGNOSIS — R4189 Other symptoms and signs involving cognitive functions and awareness: Secondary | ICD-10-CM | POA: Diagnosis not present

## 2021-12-05 DIAGNOSIS — R278 Other lack of coordination: Secondary | ICD-10-CM | POA: Diagnosis not present

## 2021-12-06 DIAGNOSIS — R2681 Unsteadiness on feet: Secondary | ICD-10-CM | POA: Diagnosis not present

## 2021-12-06 DIAGNOSIS — K51019 Ulcerative (chronic) pancolitis with unspecified complications: Secondary | ICD-10-CM | POA: Diagnosis not present

## 2021-12-06 DIAGNOSIS — Z9181 History of falling: Secondary | ICD-10-CM | POA: Diagnosis not present

## 2021-12-06 DIAGNOSIS — R4189 Other symptoms and signs involving cognitive functions and awareness: Secondary | ICD-10-CM | POA: Diagnosis not present

## 2021-12-06 DIAGNOSIS — I10 Essential (primary) hypertension: Secondary | ICD-10-CM | POA: Diagnosis not present

## 2021-12-06 DIAGNOSIS — F0390 Unspecified dementia without behavioral disturbance: Secondary | ICD-10-CM | POA: Diagnosis not present

## 2021-12-06 DIAGNOSIS — M25552 Pain in left hip: Secondary | ICD-10-CM | POA: Diagnosis not present

## 2021-12-06 DIAGNOSIS — M6281 Muscle weakness (generalized): Secondary | ICD-10-CM | POA: Diagnosis not present

## 2021-12-06 DIAGNOSIS — R278 Other lack of coordination: Secondary | ICD-10-CM | POA: Diagnosis not present

## 2021-12-09 DIAGNOSIS — R278 Other lack of coordination: Secondary | ICD-10-CM | POA: Diagnosis not present

## 2021-12-09 DIAGNOSIS — M6281 Muscle weakness (generalized): Secondary | ICD-10-CM | POA: Diagnosis not present

## 2021-12-09 DIAGNOSIS — F0390 Unspecified dementia without behavioral disturbance: Secondary | ICD-10-CM | POA: Diagnosis not present

## 2021-12-09 DIAGNOSIS — K51019 Ulcerative (chronic) pancolitis with unspecified complications: Secondary | ICD-10-CM | POA: Diagnosis not present

## 2021-12-09 DIAGNOSIS — R2681 Unsteadiness on feet: Secondary | ICD-10-CM | POA: Diagnosis not present

## 2021-12-09 DIAGNOSIS — R4189 Other symptoms and signs involving cognitive functions and awareness: Secondary | ICD-10-CM | POA: Diagnosis not present

## 2021-12-09 DIAGNOSIS — M25552 Pain in left hip: Secondary | ICD-10-CM | POA: Diagnosis not present

## 2021-12-09 DIAGNOSIS — Z9181 History of falling: Secondary | ICD-10-CM | POA: Diagnosis not present

## 2021-12-09 DIAGNOSIS — I10 Essential (primary) hypertension: Secondary | ICD-10-CM | POA: Diagnosis not present

## 2021-12-10 DIAGNOSIS — M25552 Pain in left hip: Secondary | ICD-10-CM | POA: Diagnosis not present

## 2021-12-10 DIAGNOSIS — R2681 Unsteadiness on feet: Secondary | ICD-10-CM | POA: Diagnosis not present

## 2021-12-10 DIAGNOSIS — F0390 Unspecified dementia without behavioral disturbance: Secondary | ICD-10-CM | POA: Diagnosis not present

## 2021-12-10 DIAGNOSIS — K51019 Ulcerative (chronic) pancolitis with unspecified complications: Secondary | ICD-10-CM | POA: Diagnosis not present

## 2021-12-10 DIAGNOSIS — Z9181 History of falling: Secondary | ICD-10-CM | POA: Diagnosis not present

## 2021-12-10 DIAGNOSIS — R278 Other lack of coordination: Secondary | ICD-10-CM | POA: Diagnosis not present

## 2021-12-10 DIAGNOSIS — M6281 Muscle weakness (generalized): Secondary | ICD-10-CM | POA: Diagnosis not present

## 2021-12-10 DIAGNOSIS — I10 Essential (primary) hypertension: Secondary | ICD-10-CM | POA: Diagnosis not present

## 2021-12-10 DIAGNOSIS — R4189 Other symptoms and signs involving cognitive functions and awareness: Secondary | ICD-10-CM | POA: Diagnosis not present

## 2021-12-11 DIAGNOSIS — R2681 Unsteadiness on feet: Secondary | ICD-10-CM | POA: Diagnosis not present

## 2021-12-11 DIAGNOSIS — R278 Other lack of coordination: Secondary | ICD-10-CM | POA: Diagnosis not present

## 2021-12-11 DIAGNOSIS — I10 Essential (primary) hypertension: Secondary | ICD-10-CM | POA: Diagnosis not present

## 2021-12-11 DIAGNOSIS — R4189 Other symptoms and signs involving cognitive functions and awareness: Secondary | ICD-10-CM | POA: Diagnosis not present

## 2021-12-11 DIAGNOSIS — M25552 Pain in left hip: Secondary | ICD-10-CM | POA: Diagnosis not present

## 2021-12-11 DIAGNOSIS — Z9181 History of falling: Secondary | ICD-10-CM | POA: Diagnosis not present

## 2021-12-11 DIAGNOSIS — M6281 Muscle weakness (generalized): Secondary | ICD-10-CM | POA: Diagnosis not present

## 2021-12-11 DIAGNOSIS — F0390 Unspecified dementia without behavioral disturbance: Secondary | ICD-10-CM | POA: Diagnosis not present

## 2021-12-11 DIAGNOSIS — K51019 Ulcerative (chronic) pancolitis with unspecified complications: Secondary | ICD-10-CM | POA: Diagnosis not present

## 2021-12-12 DIAGNOSIS — R278 Other lack of coordination: Secondary | ICD-10-CM | POA: Diagnosis not present

## 2021-12-12 DIAGNOSIS — I10 Essential (primary) hypertension: Secondary | ICD-10-CM | POA: Diagnosis not present

## 2021-12-12 DIAGNOSIS — M25552 Pain in left hip: Secondary | ICD-10-CM | POA: Diagnosis not present

## 2021-12-12 DIAGNOSIS — K51019 Ulcerative (chronic) pancolitis with unspecified complications: Secondary | ICD-10-CM | POA: Diagnosis not present

## 2021-12-12 DIAGNOSIS — F0390 Unspecified dementia without behavioral disturbance: Secondary | ICD-10-CM | POA: Diagnosis not present

## 2021-12-12 DIAGNOSIS — M6281 Muscle weakness (generalized): Secondary | ICD-10-CM | POA: Diagnosis not present

## 2021-12-12 DIAGNOSIS — R4189 Other symptoms and signs involving cognitive functions and awareness: Secondary | ICD-10-CM | POA: Diagnosis not present

## 2021-12-12 DIAGNOSIS — R2681 Unsteadiness on feet: Secondary | ICD-10-CM | POA: Diagnosis not present

## 2021-12-12 DIAGNOSIS — Z9181 History of falling: Secondary | ICD-10-CM | POA: Diagnosis not present

## 2021-12-16 ENCOUNTER — Inpatient Hospital Stay (HOSPITAL_COMMUNITY)
Admission: EM | Admit: 2021-12-16 | Discharge: 2021-12-20 | DRG: 872 | Disposition: A | Discharge status: Skilled Nursing Facility | Payer: Medicare PPO | Attending: Internal Medicine | Admitting: Internal Medicine

## 2021-12-16 ENCOUNTER — Emergency Department (HOSPITAL_COMMUNITY): Payer: Medicare PPO

## 2021-12-16 ENCOUNTER — Other Ambulatory Visit: Payer: Self-pay

## 2021-12-16 DIAGNOSIS — R778 Other specified abnormalities of plasma proteins: Secondary | ICD-10-CM

## 2021-12-16 DIAGNOSIS — Z885 Allergy status to narcotic agent status: Secondary | ICD-10-CM

## 2021-12-16 DIAGNOSIS — I10 Essential (primary) hypertension: Secondary | ICD-10-CM | POA: Diagnosis not present

## 2021-12-16 DIAGNOSIS — N390 Urinary tract infection, site not specified: Secondary | ICD-10-CM | POA: Diagnosis not present

## 2021-12-16 DIAGNOSIS — Z888 Allergy status to other drugs, medicaments and biological substances status: Secondary | ICD-10-CM | POA: Diagnosis not present

## 2021-12-16 DIAGNOSIS — Z79899 Other long term (current) drug therapy: Secondary | ICD-10-CM | POA: Diagnosis not present

## 2021-12-16 DIAGNOSIS — K219 Gastro-esophageal reflux disease without esophagitis: Secondary | ICD-10-CM | POA: Diagnosis not present

## 2021-12-16 DIAGNOSIS — R531 Weakness: Secondary | ICD-10-CM

## 2021-12-16 DIAGNOSIS — A4159 Other Gram-negative sepsis: Principal | ICD-10-CM | POA: Diagnosis present

## 2021-12-16 DIAGNOSIS — I959 Hypotension, unspecified: Secondary | ICD-10-CM | POA: Diagnosis not present

## 2021-12-16 DIAGNOSIS — R000 Tachycardia, unspecified: Secondary | ICD-10-CM | POA: Diagnosis not present

## 2021-12-16 DIAGNOSIS — Z91040 Latex allergy status: Secondary | ICD-10-CM

## 2021-12-16 DIAGNOSIS — R42 Dizziness and giddiness: Secondary | ICD-10-CM | POA: Diagnosis not present

## 2021-12-16 DIAGNOSIS — M6281 Muscle weakness (generalized): Secondary | ICD-10-CM | POA: Diagnosis not present

## 2021-12-16 DIAGNOSIS — I4891 Unspecified atrial fibrillation: Secondary | ICD-10-CM | POA: Diagnosis not present

## 2021-12-16 DIAGNOSIS — R1312 Dysphagia, oropharyngeal phase: Secondary | ICD-10-CM | POA: Diagnosis not present

## 2021-12-16 DIAGNOSIS — N179 Acute kidney failure, unspecified: Secondary | ICD-10-CM

## 2021-12-16 DIAGNOSIS — I700 Atherosclerosis of aorta: Secondary | ICD-10-CM | POA: Diagnosis not present

## 2021-12-16 DIAGNOSIS — L89322 Pressure ulcer of left buttock, stage 2: Secondary | ICD-10-CM | POA: Diagnosis present

## 2021-12-16 DIAGNOSIS — F0390 Unspecified dementia without behavioral disturbance: Secondary | ICD-10-CM | POA: Diagnosis not present

## 2021-12-16 DIAGNOSIS — R2681 Unsteadiness on feet: Secondary | ICD-10-CM | POA: Diagnosis not present

## 2021-12-16 DIAGNOSIS — R7989 Other specified abnormal findings of blood chemistry: Secondary | ICD-10-CM | POA: Diagnosis present

## 2021-12-16 DIAGNOSIS — M25552 Pain in left hip: Secondary | ICD-10-CM | POA: Diagnosis not present

## 2021-12-16 DIAGNOSIS — A419 Sepsis, unspecified organism: Secondary | ICD-10-CM | POA: Diagnosis not present

## 2021-12-16 DIAGNOSIS — R41841 Cognitive communication deficit: Secondary | ICD-10-CM | POA: Diagnosis not present

## 2021-12-16 DIAGNOSIS — I480 Paroxysmal atrial fibrillation: Secondary | ICD-10-CM | POA: Diagnosis not present

## 2021-12-16 DIAGNOSIS — K5150 Left sided colitis without complications: Secondary | ICD-10-CM | POA: Diagnosis not present

## 2021-12-16 DIAGNOSIS — K51019 Ulcerative (chronic) pancolitis with unspecified complications: Secondary | ICD-10-CM | POA: Diagnosis not present

## 2021-12-16 DIAGNOSIS — R410 Disorientation, unspecified: Secondary | ICD-10-CM | POA: Diagnosis not present

## 2021-12-16 DIAGNOSIS — Z20822 Contact with and (suspected) exposure to covid-19: Secondary | ICD-10-CM | POA: Diagnosis not present

## 2021-12-16 DIAGNOSIS — R55 Syncope and collapse: Secondary | ICD-10-CM | POA: Diagnosis not present

## 2021-12-16 DIAGNOSIS — L89312 Pressure ulcer of right buttock, stage 2: Secondary | ICD-10-CM | POA: Diagnosis not present

## 2021-12-16 DIAGNOSIS — M6258 Muscle wasting and atrophy, not elsewhere classified, other site: Secondary | ICD-10-CM | POA: Diagnosis not present

## 2021-12-16 DIAGNOSIS — Z881 Allergy status to other antibiotic agents status: Secondary | ICD-10-CM | POA: Diagnosis not present

## 2021-12-16 DIAGNOSIS — I110 Hypertensive heart disease with heart failure: Secondary | ICD-10-CM | POA: Diagnosis present

## 2021-12-16 DIAGNOSIS — Z1612 Extended spectrum beta lactamase (ESBL) resistance: Secondary | ICD-10-CM | POA: Diagnosis present

## 2021-12-16 DIAGNOSIS — Z95828 Presence of other vascular implants and grafts: Secondary | ICD-10-CM

## 2021-12-16 DIAGNOSIS — I5032 Chronic diastolic (congestive) heart failure: Secondary | ICD-10-CM | POA: Diagnosis present

## 2021-12-16 DIAGNOSIS — Z66 Do not resuscitate: Secondary | ICD-10-CM | POA: Diagnosis not present

## 2021-12-16 DIAGNOSIS — E871 Hypo-osmolality and hyponatremia: Secondary | ICD-10-CM | POA: Diagnosis not present

## 2021-12-16 DIAGNOSIS — R278 Other lack of coordination: Secondary | ICD-10-CM | POA: Diagnosis not present

## 2021-12-16 DIAGNOSIS — J9811 Atelectasis: Secondary | ICD-10-CM | POA: Diagnosis not present

## 2021-12-16 DIAGNOSIS — Z9181 History of falling: Secondary | ICD-10-CM | POA: Diagnosis not present

## 2021-12-16 DIAGNOSIS — R4189 Other symptoms and signs involving cognitive functions and awareness: Secondary | ICD-10-CM | POA: Diagnosis not present

## 2021-12-16 DIAGNOSIS — Z7401 Bed confinement status: Secondary | ICD-10-CM | POA: Diagnosis not present

## 2021-12-16 DIAGNOSIS — R2689 Other abnormalities of gait and mobility: Secondary | ICD-10-CM | POA: Diagnosis not present

## 2021-12-16 DIAGNOSIS — R262 Difficulty in walking, not elsewhere classified: Secondary | ICD-10-CM | POA: Diagnosis not present

## 2021-12-16 LAB — AMMONIA: Ammonia: 30 umol/L (ref 9–35)

## 2021-12-16 LAB — PROTIME-INR
INR: 1.1 (ref 0.8–1.2)
Prothrombin Time: 14.2 seconds (ref 11.4–15.2)

## 2021-12-16 LAB — COMPREHENSIVE METABOLIC PANEL
ALT: 11 U/L (ref 0–44)
AST: 19 U/L (ref 15–41)
Albumin: 3.1 g/dL — ABNORMAL LOW (ref 3.5–5.0)
Alkaline Phosphatase: 75 U/L (ref 38–126)
Anion gap: 10 (ref 5–15)
BUN: 31 mg/dL — ABNORMAL HIGH (ref 8–23)
CO2: 21 mmol/L — ABNORMAL LOW (ref 22–32)
Calcium: 9.4 mg/dL (ref 8.9–10.3)
Chloride: 98 mmol/L (ref 98–111)
Creatinine, Ser: 2.33 mg/dL — ABNORMAL HIGH (ref 0.44–1.00)
GFR, Estimated: 20 mL/min — ABNORMAL LOW (ref >60)
Glucose, Bld: 97 mg/dL (ref 70–99)
Potassium: 4.4 mmol/L (ref 3.5–5.1)
Sodium: 129 mmol/L — ABNORMAL LOW (ref 135–145)
Total Bilirubin: 0.7 mg/dL (ref 0.3–1.2)
Total Protein: 6 g/dL — ABNORMAL LOW (ref 6.5–8.1)

## 2021-12-16 LAB — CBC WITH DIFFERENTIAL/PLATELET
Abs Immature Granulocytes: 0.07 10*3/uL (ref 0.00–0.07)
Basophils Absolute: 0.1 10*3/uL (ref 0.0–0.1)
Basophils Relative: 0 %
Eosinophils Absolute: 0.2 10*3/uL (ref 0.0–0.5)
Eosinophils Relative: 1 %
HCT: 38.6 % (ref 36.0–46.0)
Hemoglobin: 12.5 g/dL (ref 12.0–15.0)
Immature Granulocytes: 1 %
Lymphocytes Relative: 3 %
Lymphs Abs: 0.5 10*3/uL — ABNORMAL LOW (ref 0.7–4.0)
MCH: 31.3 pg (ref 26.0–34.0)
MCHC: 32.4 g/dL (ref 30.0–36.0)
MCV: 96.5 fL (ref 80.0–100.0)
Monocytes Absolute: 0.2 10*3/uL (ref 0.1–1.0)
Monocytes Relative: 2 %
Neutro Abs: 14.5 10*3/uL — ABNORMAL HIGH (ref 1.7–7.7)
Neutrophils Relative %: 93 %
Platelets: 179 10*3/uL (ref 150–400)
RBC: 4 MIL/uL (ref 3.87–5.11)
RDW: 13.1 % (ref 11.5–15.5)
WBC: 15.5 10*3/uL — ABNORMAL HIGH (ref 4.0–10.5)
nRBC: 0 % (ref 0.0–0.2)

## 2021-12-16 LAB — RESP PANEL BY RT-PCR (FLU A&B, COVID) ARPGX2
Influenza A by PCR: NEGATIVE
Influenza B by PCR: NEGATIVE
SARS Coronavirus 2 by RT PCR: NEGATIVE

## 2021-12-16 LAB — TROPONIN I (HIGH SENSITIVITY)
Troponin I (High Sensitivity): 21 ng/L — ABNORMAL HIGH (ref <18)
Troponin I (High Sensitivity): 15 ng/L (ref <18)

## 2021-12-16 LAB — BRAIN NATRIURETIC PEPTIDE: B Natriuretic Peptide: 171.2 pg/mL — ABNORMAL HIGH (ref 0.0–100.0)

## 2021-12-16 LAB — TSH: TSH: 1.876 u[IU]/mL (ref 0.350–4.500)

## 2021-12-16 LAB — MAGNESIUM: Magnesium: 2.2 mg/dL (ref 1.7–2.4)

## 2021-12-16 MED ORDER — FLUCONAZOLE 100 MG PO TABS
100.0000 mg | ORAL_TABLET | Freq: Once | ORAL | Status: AC
  Administered 2021-12-16: 100 mg via ORAL
  Filled 2021-12-16: qty 1

## 2021-12-16 MED ORDER — MAGNESIUM HYDROXIDE 400 MG/5ML PO SUSP
30.0000 mL | Freq: Every evening | ORAL | Status: DC | PRN
  Filled 2021-12-16: qty 30

## 2021-12-16 MED ORDER — ALBUTEROL SULFATE (2.5 MG/3ML) 0.083% IN NEBU: 2.5000 mg | INHALATION_SOLUTION | Freq: Four times a day (QID) | RESPIRATORY_TRACT | Status: DC | PRN

## 2021-12-16 MED ORDER — APIXABAN 2.5 MG PO TABS
2.5000 mg | ORAL_TABLET | Freq: Two times a day (BID) | ORAL | Status: DC
  Administered 2021-12-16 – 2021-12-20 (×8): 2.5 mg via ORAL
  Filled 2021-12-16 (×9): qty 1

## 2021-12-16 MED ORDER — ACETAMINOPHEN 325 MG PO TABS: 650.0000 mg | ORAL_TABLET | Freq: Four times a day (QID) | ORAL | Status: DC | PRN

## 2021-12-16 MED ORDER — ZINC OXIDE 40 % EX OINT
1.0000 "application " | TOPICAL_OINTMENT | Freq: Three times a day (TID) | CUTANEOUS | Status: DC | PRN
  Filled 2021-12-16: qty 57

## 2021-12-16 MED ORDER — CLOTRIMAZOLE 2 % VA CREA
1.0000 | TOPICAL_CREAM | Freq: Every day | VAGINAL | Status: AC
  Administered 2021-12-16 – 2021-12-18 (×3): 1 via VAGINAL
  Filled 2021-12-16 (×2): qty 21

## 2021-12-16 MED ORDER — FLUTICASONE PROPIONATE 50 MCG/ACT NA SUSP
1.0000 | Freq: Every day | NASAL | Status: DC | PRN
  Filled 2021-12-16: qty 16

## 2021-12-16 MED ORDER — SULFASALAZINE 500 MG PO TABS
500.0000 mg | ORAL_TABLET | Freq: Three times a day (TID) | ORAL | Status: DC
  Administered 2021-12-16 – 2021-12-20 (×13): 500 mg via ORAL
  Filled 2021-12-16 (×14): qty 1

## 2021-12-16 MED ORDER — SODIUM CHLORIDE 0.9 % IV SOLN: INTRAVENOUS | Status: DC

## 2021-12-16 MED ORDER — CARVEDILOL 3.125 MG PO TABS
3.1250 mg | ORAL_TABLET | Freq: Two times a day (BID) | ORAL | Status: DC
  Administered 2021-12-16 – 2021-12-20 (×8): 3.125 mg via ORAL
  Filled 2021-12-16 (×8): qty 1

## 2021-12-16 MED ORDER — SENNOSIDES-DOCUSATE SODIUM 8.6-50 MG PO TABS
1.0000 | ORAL_TABLET | ORAL | Status: DC
  Administered 2021-12-18: 1 via ORAL
  Filled 2021-12-16: qty 1

## 2021-12-16 MED ORDER — ACETAMINOPHEN 650 MG RE SUPP: 650.0000 mg | Freq: Four times a day (QID) | RECTAL | Status: DC | PRN

## 2021-12-16 MED ORDER — SODIUM CHLORIDE 0.9% FLUSH
3.0000 mL | Freq: Two times a day (BID) | INTRAVENOUS | Status: DC
  Administered 2021-12-16 – 2021-12-19 (×4): 3 mL via INTRAVENOUS

## 2021-12-16 MED ORDER — LIDOCAINE 5 % EX PTCH
1.0000 | MEDICATED_PATCH | Freq: Every morning | CUTANEOUS | Status: DC
  Filled 2021-12-16: qty 1

## 2021-12-16 MED ORDER — GERHARDT'S BUTT CREAM
1.0000 "application " | TOPICAL_CREAM | Freq: Three times a day (TID) | CUTANEOUS | Status: DC | PRN
  Filled 2021-12-16: qty 1

## 2021-12-16 MED ORDER — BUSPIRONE HCL 5 MG PO TABS
5.0000 mg | ORAL_TABLET | Freq: Two times a day (BID) | ORAL | Status: DC
  Administered 2021-12-16 – 2021-12-20 (×8): 5 mg via ORAL
  Filled 2021-12-16 (×8): qty 1

## 2021-12-16 MED ORDER — PANTOPRAZOLE SODIUM 40 MG PO TBEC
40.0000 mg | DELAYED_RELEASE_TABLET | Freq: Every day | ORAL | Status: DC
  Administered 2021-12-17 – 2021-12-20 (×4): 40 mg via ORAL
  Filled 2021-12-16 (×4): qty 1

## 2021-12-16 NOTE — Assessment & Plan Note (Signed)
Patient has a history of diastolic CHF with last EF noted to be 60- 65% with grade 2 diastolic dysfunction back in 2015.  At this time patient appears to be due to initial hypotension.  BNP was mildly elevated at 171.2.  Chest x-ray was otherwise noted to be clear without signs of fluid overload. -Strict intake and output and daily weights

## 2021-12-16 NOTE — ED Triage Notes (Signed)
Pt here via [facility] from [facility] for weakness and reported hypotension from facility. Initial BP w EMS was 18Z systolic, EMS gave 358IP NS, BP increased to 99/60s. Hx dementia,

## 2021-12-16 NOTE — Assessment & Plan Note (Addendum)
In route with EMS systolic blood pressures were noted to be in the 70s with improvement after initial IV fluid bolus.  Home blood pressure regimen includes Coreg 3.125 mg twice daily and lisinopril 10 mg daily. -Admit to a telemetry bed -Hold lisinopril  -IV fluids at 75 mL/h.  Adjust IV fluids as needed to maintain goal MAP >65 -Resume Coreg once able

## 2021-12-16 NOTE — Assessment & Plan Note (Addendum)
Patient is in atrial fibrillation, but appears rate controlled at this time.  Unclear if this is new or this is known as patient is on anticoagulation and has history of valve heart issue. -Continue Eliquis.  Will resume Coreg once blood pressures maintained stable

## 2021-12-16 NOTE — ED Provider Notes (Signed)
[facility] EMERGENCY DEPARTMENT Provider Note   CSN: [id number] Arrival date & time: 12/16/21  1311     History  Chief Complaint  Patient presents with   Hypotension   Weakness    Autumn Dean is a 88 y.o. female.  The history is provided by the patient and medical records. No language interpreter was used.  Weakness Severity:  Severe Onset quality:  Gradual Duration:  3 days Timing:  Constant Progression:  Worsening Chronicity:  New Relieved by:  Nothing Worsened by:  Nothing Ineffective treatments:  None tried Associated symptoms: near-syncope (ightheaded)   Associated symptoms: no abdominal pain, no aphasia, no chest pain, no cough, no diarrhea, no drooling, no dysuria, no falls, no fever, no foul-smelling urine, no frequency, no headaches, no loss of consciousness, no melena, no myalgias, no nausea, no shortness of breath, no syncope, no vision change and no vomiting       Home Medications Prior to Admission medications   Medication Sig Start Date End Date Taking? Authorizing Provider  acetaminophen (TYLENOL) 325 MG tablet Take 650 mg by mouth every 6 (six) hours as needed for mild pain.    [provider]  bisoprolol-hydrochlorothiazide (ZIAC) 5-6.25 MG per tablet Take 1 tablet by mouth daily.    [provider]  cyanocobalamin 500 MCG tablet Take 1,000 mcg by mouth daily.     [provider]  esomeprazole (NEXIUM) 20 MG capsule Take 1 capsule (20 mg total) by mouth daily at 12 noon. 11/07/20   Rogene Houston, MD  esomeprazole (NEXIUM) 40 MG capsule Take 1 capsule (40 mg total) by mouth daily before breakfast. Patient not taking: No sig reported 10/30/20   Rogene Houston, MD  famotidine (PEPCID) 20 MG tablet Take 1 tablet (20 mg total) by mouth at bedtime. Patient not taking: No sig reported 10/30/20   Rogene Houston, MD  fluticasone (FLONASE) 50 MCG/ACT nasal spray Place 1 spray into both nostrils daily as needed  for allergies.    [provider]  folic acid (FOLVITE) 1 MG tablet TAKE 1 TABLET BY MOUTH DAILY Patient taking differently: Take 1 mg by mouth daily. 06/04/20   Laurine Blazer B, PA-C  lisinopril (ZESTRIL) 20 MG tablet Take 20 mg by mouth daily. 02/08/21   [provider]  naproxen (NAPROSYN) 250 MG tablet Take 1 tablet (250 mg total) by mouth 2 (two) times daily as needed for moderate pain. 02/23/21   Rancour, Annie Main, MD  Simethicone (PHAZYME) 180 MG CAPS Take 1 capsule (180 mg total) by mouth every 6 (six) hours as needed. Patient not taking: No sig reported 12/02/17   Rogene Houston, MD  sulfaSALAzine (AZULFIDINE) 500 MG tablet Take 1 tablet (500 mg total) by mouth 3 (three) times daily. 10/30/20   Rehman, Mechele Dawley, MD  trimethoprim (TRIMPEX) 100 MG tablet Take 1 tablet (100 mg total) by mouth at bedtime. 10/05/20   McKenzie, Candee Furbish, MD  sucralfate (CARAFATE) 1 GM/10ML suspension Take 10 mLs (1 g total) by mouth 4 (four) times daily. 12/04/11 01/28/12  Setzer, Rona Ravens, NP      Allergies    Macrobid [nitrofurantoin macrocrystal], Ciprofloxacin, Nitrofurantoin, Codeine, Demerol, and Latex    Review of Systems   Review of Systems  Constitutional:  Positive for fatigue. Negative for chills, diaphoresis and fever.  HENT:  Negative for congestion and drooling.   Eyes:  Negative for visual disturbance.  Respiratory:  Negative for cough, chest tightness, shortness of  breath and wheezing.   Cardiovascular:  Positive for leg swelling (chronic by pt report) and near-syncope (ightheaded). Negative for chest pain, palpitations and syncope.  Gastrointestinal:  Negative for abdominal pain, constipation, diarrhea, melena, nausea and vomiting.  Genitourinary:  Negative for dysuria, flank pain and frequency.  Musculoskeletal:  Negative for back pain, falls, myalgias, neck pain and neck stiffness.  Skin:  Negative for rash and wound.  Neurological:  Positive for weakness and  light-headedness. Negative for loss of consciousness, numbness and headaches.  Psychiatric/Behavioral:  Negative for agitation and confusion.   All other systems reviewed and are negative.  Physical Exam Updated Vital Signs BP (!) 99/48    Pulse 74    Resp 20    SpO2 96%  Physical Exam Vitals and nursing note reviewed.  Constitutional:      General: She is not in acute distress.    Appearance: She is well-developed. She is not ill-appearing, toxic-appearing or diaphoretic.  HENT:     Head: Normocephalic and atraumatic.     Nose: Nose normal.     Mouth/Throat:     Mouth: Mucous membranes are moist.  Eyes:     Conjunctiva/sclera: Conjunctivae normal.     Pupils: Pupils are equal, round, and reactive to light.  Cardiovascular:     Rate and Rhythm: Regular rhythm. Tachycardia present.     Heart sounds: Murmur heard.  Pulmonary:     Effort: Pulmonary effort is normal. No respiratory distress.     Breath sounds: Normal breath sounds. No wheezing, rhonchi or rales.  Chest:     Chest wall: No tenderness.  Abdominal:     General: Abdomen is flat.     Palpations: Abdomen is soft.     Tenderness: There is no abdominal tenderness. There is no right CVA tenderness, left CVA tenderness, guarding or rebound.  Musculoskeletal:        General: No swelling or tenderness.     Cervical back: Neck supple. No tenderness.     Right lower leg: Edema present.     Left lower leg: Edema present.  Skin:    General: Skin is warm and dry.     Capillary Refill: Capillary refill takes less than 2 seconds.     Findings: No erythema or rash.  Neurological:     General: No focal deficit present.     Mental Status: She is alert.     Sensory: No sensory deficit.     Motor: No weakness.  Psychiatric:        Mood and Affect: Mood normal.    ED Results / Procedures / Treatments   Labs (all labs ordered are listed, but only abnormal results are displayed) Labs Reviewed  CBC WITH DIFFERENTIAL/PLATELET -  Abnormal; Notable for the following components:      Result Value   WBC 15.5 (*)    Neutro Abs 14.5 (*)    Lymphs Abs 0.5 (*)    All other components within normal limits  COMPREHENSIVE METABOLIC PANEL - Abnormal; Notable for the following components:   Sodium 129 (*)    CO2 21 (*)    BUN 31 (*)    Creatinine, Ser 2.33 (*)    Total Protein 6.0 (*)    Albumin 3.1 (*)    GFR, Estimated 20 (*)    All other components within normal limits  BRAIN NATRIURETIC PEPTIDE - Abnormal; Notable for the following components:   B Natriuretic Peptide 171.2 (*)    All other components  within normal limits  TROPONIN I (HIGH SENSITIVITY) - Abnormal; Notable for the following components:   Troponin I (High Sensitivity) 21 (*)    All other components within normal limits  RESP PANEL BY RT-PCR (FLU A&B, COVID) ARPGX2  URINE CULTURE  CULTURE, BLOOD (ROUTINE X 2)  CULTURE, BLOOD (ROUTINE X 2)  PROTIME-INR  AMMONIA  TSH  MAGNESIUM  URINALYSIS, ROUTINE W REFLEX MICROSCOPIC  TROPONIN I (HIGH SENSITIVITY)    EKG EKG Interpretation  Date/Time:  Monday December 16 2021 13:14:20 EST Ventricular Rate:  97 PR Interval:    QRS Duration: 70 QT Interval:  380 QTC Calculation: 483 R Axis:   56 Text Interpretation: Atrial fibrillation Borderline T abnormalities, anterior leads No prior ECG for comparison. No STEMI Confirmed by Antony Blackbird [phone]) on 12/16/2021 1:23:36 PM  Radiology DG Chest Portable 1 View  Result Date: 12/16/2021 CLINICAL DATA:  Lightheadedness EXAM: PORTABLE CHEST 1 VIEW COMPARISON:  None. FINDINGS: The heart size and mediastinal contours are within normal limits. Aortic atherosclerosis. No focal airspace consolidation, pleural effusion, or pneumothorax. The visualized skeletal structures are unremarkable. IMPRESSION: No active disease. Electronically Signed   By: Davina Poke D.O.   On: 12/16/2021 14:11    Procedures Procedures    Medications Ordered in ED Medications - No  data to display  ED Course/ Medical Decision Making/ A&P                           Medical Decision Making Amount and/or Complexity of Data Reviewed Labs: ordered. Radiology: ordered.   Autumn Dean is a 88 y.o. female with a past medical history significant for hypertension, GERD, ulcerative colitis, dementia per family, and currently on antibiotics for UTI who presents for fatigue and hypotension.  According to patient, she has been feeling bad for the last few days with less and less energy.  She does not report any urinary symptoms but agrees feeling very drained and tired.  She was found to be hypotensive with blood pressure in the 70s with EMS and was given 750 mL of normal saline to improve it into the 90s.  She is still feeling tired.  She denies any fevers, chills, chest pain, shortness of breath.  Family does report a dry cough but she denies any chest pain or shortness of breath.  She denies any abdominal pain, constipation, or diarrhea.  She denies any urinary changes time.  She does not think that her legs are more swollen than normal but she agrees they are slightly swollen.  EKG on arrival shows A-fib.  Patient denies any history of A-fib.  On exam, lungs were clear and chest was nontender.  Abdomen was nontender.  She has some edema in her legs but had normal sensation and strength.  Good pulses in extremities.  Patient resting comfortably with blood pressure in the 90s.  She is warm to the touch, will get rectal temp.  Clinically I am concerned that patient may be having new A-fib causing her low pressures versus continued UTI leading to her hypotension.  We will get screening labs including urinalysis and blood work and cultures.  We will also get chest x-ray with his reported cough and will also get BNP for the edema and some cardiac work-up for the new A-fib.  Due to her pressures being soft and her being very lightheaded and fatigue, I do anticipate she will need  admission.  4:30 PM Care transferred to oncoming  team while awaiting urinalysis.  Anticipate admission for what appears to be new AKI,  New atrial fibrillation, and soft pressures at home with lightheadedness and near syncope.        Final Clinical Impression(s) / ED Diagnoses Final diagnoses:  Near syncope  Atrial fibrillation, unspecified type ([place])  AKI (acute kidney injury) ([place])    Clinical Impression: 1. Near syncope   2. Atrial fibrillation, unspecified type ([place])   3. AKI (acute kidney injury) ([place])     Disposition: Admit  This note was prepared with assistance of Dragon voice recognition software. Occasional wrong-word or sound-a-like substitutions may have occurred due to the inherent limitations of voice recognition software.      Sufyan Meidinger, Gwenyth Allegra, MD 12/16/21 [phone]

## 2021-12-16 NOTE — Assessment & Plan Note (Addendum)
Prior to arrival.  Patient had just recently completed a 5-day course of Bactrim on 2/12 after being diagnosed with urinary tract infection in the outpatient setting.  A urine culture has been sent, but urinalysis cannot initially be obtained due to lack of urine present on In-N-Out cath.  Nursing noted patient to have concern for yeast infection. -Follow-up urine culture  -Follow-up urinalysis once able to be obtained -Empirically given Diflucan 100 mg due to kidney function, but  will supplement with clotrimazole 2% vaginal cream x3  Days.

## 2021-12-16 NOTE — ED Provider Notes (Signed)
I received the patient in signout from Dr. Sherry Ruffing, briefly the patient is a 88 year old female with generalized fatigue.  Found to be presyncopal today and had called EMS noted to be hypotensive in the field responded to the IV fluids.  A-fib with no history of A-fib.  AKI.  I discussed with medicine for admission.   Deno Etienne, DO 12/16/21 1700

## 2021-12-16 NOTE — Assessment & Plan Note (Signed)
Patient denies any complaints of diarrhea.  Home regimen includes sulfasalazine 500 mg 3 times daily.

## 2021-12-16 NOTE — Assessment & Plan Note (Addendum)
On admission creatinine noted to be elevated up to 2.33 with BUN 31.  Baseline creatinine previously 0.85 on 02/27/2021.  She had been on Bactrim which could be the cause. -Hold possible nephrotoxic agents -Check urine creatinine and urine sodium -Normal saline IV fluids -Daily monitoring of kidney function

## 2021-12-16 NOTE — H&P (Signed)
History and Physical    Patient: Autumn Dean WFU:[id number] DOB: Jun 24, 1933 DOA: 12/16/2021 DOS: the patient was seen and examined on 12/16/2021 PCP: Glenda Chroman, MD  Patient coming from: [place] via EMS  Chief Complaint:  Chief Complaint  Patient presents with   Hypotension   Weakness    HPI: Autumn Dean is a 88 y.o. female with medical history significant of dementia, hypertension, ulcerative colitis, and GERD who presents with complaints of not feeling well and weak.  She had recently been placed on Bactrim for concern for UTI and completed a 5-day course on 2/12.  She reports that she still has had some dysuria and had not been feeling well.  Denies having any nausea, vomiting, diarrhea, chest pain, or recent falls. She is a retired [employer] scientist.  In route with EMS patient's blood pressure was noted to have blood pressures in the 70s.  Patient was given 750 mL of normal saline IV fluids with rate and blood pressure to 99/60.Labs significant for WBC 15.5, sodium 129, BUN 31,  creatinine 2.33, high-sensitivity troponin 21, and BNP 171.2.  Chest x-ray showed no acute abnormalities.  Influenza and COVID-19 screening were negative.  Blood and urine cultures were obtained.  Review of Systems: As mentioned in the history of present illness. All other systems reviewed and are negative. Past Medical History:  Diagnosis Date   GERD (gastroesophageal reflux disease)    Hiatal hernia    Hypertension    Proctitis    UC (ulcerative colitis confined to rectum) [facility])    Past Surgical History:  Procedure Laterality Date   COLONOSCOPY     COLONOSCOPY  08/18/2012   Procedure: COLONOSCOPY;  Surgeon: Rogene Houston, MD;  Location: [facility] ENDO SUITE;  Service: Endoscopy;  Laterality: N/A;  [place]     ESOPHAGOGASTRODUODENOSCOPY  12/18/2011   Procedure: ESOPHAGOGASTRODUODENOSCOPY (EGD);  Surgeon: Rogene Houston, MD;  Location: [facility] ENDO SUITE;  Service:  Endoscopy;  Laterality: N/A;  200   TONSILLECTOMY     Social History:  reports that she has never smoked. She has never used smokeless tobacco. She reports that she does not drink alcohol and does not use drugs.  Allergies  Allergen Reactions   Macrobid [Nitrofurantoin Macrocrystal] Other (See Comments)    Extreme dizziness   Ciprofloxacin Nausea And Vomiting   Nitrofurantoin Nausea And Vomiting   Codeine Rash and Itching   Demerol Rash   Latex Rash    Redness    Family History  Problem Relation Age of Onset   Colon cancer Neg Hx     Prior to Admission medications   Medication Sig Start Date End Date Taking? Authorizing Provider  acetaminophen (TYLENOL) 325 MG tablet Take 650 mg by mouth every 6 (six) hours as needed for headache or fever (pain).   Yes [provider]  acetaminophen (TYLENOL) 500 MG tablet Take 1,000 mg by mouth 2 (two) times daily.   Yes [provider]  apixaban (ELIQUIS) 2.5 MG TABS tablet Take 2.5 mg by mouth 2 (two) times daily.   Yes [provider]  busPIRone (BUSPAR) 5 MG tablet Take 5 mg by mouth 2 (two) times daily.   Yes [provider]  carvedilol (COREG) 3.125 MG tablet Take 3.125 mg by mouth 2 (two) times daily.   Yes [provider]  cholecalciferol (VITAMIN D3) 25 MCG (1000 UNIT) tablet Take 1,000 Units by mouth every morning.   Yes [provider]  docusate sodium (COLACE) 100 MG capsule Take 100 mg by mouth at bedtime.   Yes [provider]  esomeprazole (NEXIUM) 20 MG capsule Take 1 capsule (20 mg total) by mouth daily at 12 noon. Patient taking differently: Take 20 mg by mouth every morning. 11/07/20  Yes Rehman, Mechele Dawley, MD  estradiol (ESTRACE) 0.1 MG/GM vaginal cream Place 1 Applicatorful vaginally once a week.   Yes [provider]  fluticasone (FLONASE) 50 MCG/ACT nasal spray Place 1 spray into both nostrils daily as needed for allergies.   Yes [provider]   folic acid (FOLVITE) 1 MG tablet TAKE 1 TABLET BY MOUTH DAILY Patient taking differently: Take 1 mg by mouth every morning. 06/04/20  Yes Laurine Blazer B, PA-C  Lidocaine 4 % PTCH Place 1 patch onto the skin every morning. Apply to left hip   Yes [provider]  lisinopril (ZESTRIL) 10 MG tablet Take 10 mg by mouth every morning.   Yes [provider]  magnesium hydroxide (MILK OF MAGNESIA) 400 MG/5ML suspension Take 30 mLs by mouth at bedtime as needed (constipation (if no BM in prior 24 hours)).   Yes [provider]  psyllium (REGULOID) 0.52 g capsule Take 0.52 g by mouth 2 (two) times daily.   Yes [provider]  senna-docusate (SENOKOT-S) 8.6-50 MG tablet Take 1 tablet by mouth See admin instructions. Take one tablet by mouth every Monday, Wednesday, Friday at bedtime   Yes [provider]  sulfaSALAzine (AZULFIDINE) 500 MG tablet Take 1 tablet (500 mg total) by mouth 3 (three) times daily. 10/30/20  Yes Rehman, Mechele Dawley, MD  vitamin B-12 (CYANOCOBALAMIN) 1000 MCG tablet Take 1,000 mcg by mouth every morning.   Yes [provider]  Zinc Oxide (BOUDREAUXS BUTT PASTE) 16 % OINT Apply 1 application topically See admin instructions. Apply topically to redness and rash over perineal and peri-rectal after cleansing area - three times daily and after cleansing as needed.   Yes [provider]  Zinc Oxide (DESITIN) 40 % PSTE Apply 1 application topically See admin instructions. Apply topically to redness and rash over perineal and per-rectal after cleaning area three times daily and as needed when soiled   Yes [provider]  sulfamethoxazole-trimethoprim (BACTRIM DS) 800-160 MG tablet Take 1 tablet by mouth 2 (two) times daily. Patient not taking: Reported on 12/16/2021    [provider]  sucralfate (CARAFATE) 1 GM/10ML suspension Take 10 mLs (1 g total) by mouth 4 (four) times daily. 12/04/11 01/28/12  Butch Penny, NP     Physical Exam: Vitals:   12/16/21 1430 12/16/21 1500 12/16/21 1545 12/16/21 1630  BP: (!) 118/58 (!) 102/51 (!) 99/48 112/63  Pulse: (!) 103 91 74 95  Resp: 13 17 20 17   Temp:    97.8 F (36.6 C)  TempSrc:    Oral  SpO2: 97% 95% 96% 95%   Exam  Constitutional: Elderly female who appears to be no acute distress Eyes: PERRL, lids and conjunctivae normal ENMT: Mucous membranes are dry posterior pharynx clear of any exudate or lesions.  Neck: normal, supple Respiratory: clear to auscultation bilaterally, no wheezing, no crackles. Normal respiratory effort. No accessory muscle use.  Cardiovascular: Irregular Irregular.  No significant lower extremity edema Abdomen: no tenderness, no masses palpated. Bowel sounds positive.  Musculoskeletal: no clubbing / cyanosis. No joint deformity upper and lower extremities. Good ROM, no contractures. Normal muscle tone.  Skin: no rashes, lesions, ulcers.  Poor  turgor. Neurologic: CN 2-12 grossly intact.  Able to move all extremity Psychiatric: Mild memory loss.  Otherwise alert and oriented to person and place.  Normal mood.   Data Reviewed:    EKG revealed atrial fibrillation at 97 bpm  Assessment and Plan: * Hypotension- (present on admission) In route with EMS systolic blood pressures were noted to be in the 70s with improvement after initial IV fluid bolus.  Home blood pressure regimen includes Coreg 3.125 mg twice daily and lisinopril 10 mg daily. -Admit to a telemetry bed -Hold lisinopril  -IV fluids at 75 mL/h.  Adjust IV fluids as needed to maintain goal MAP >65 -Resume Coreg once able  Paroxysmal atrial fibrillation ([place])- (present on admission) Patient is in atrial fibrillation, but appears rate controlled at this time.  Unclear if this is new or this is known as patient is on anticoagulation and has history of valve heart issue. -Continue Eliquis.  Will resume Coreg once blood pressures maintained stable  AKI (acute kidney  injury) ([place])- (present on admission) On admission creatinine noted to be elevated up to 2.33 with BUN 31.  Baseline creatinine previously 0.85 on 02/27/2021.  She had been on Bactrim which could be the cause. -Hold possible nephrotoxic agents -Check urine creatinine and urine sodium -Normal saline IV fluids -Daily monitoring of kidney function  Chronic diastolic CHF (congestive heart failure) ([place])- (present on admission) Patient has a history of diastolic CHF with last EF noted to be 60- 65% with grade 2 diastolic dysfunction back in 2015.  At this time patient appears to be due to initial hypotension.  BNP was mildly elevated at 171.2.  Chest x-ray was otherwise noted to be clear without signs of fluid overload. -Strict intake and output and daily weights   Left sided ulcerative colitis ([place])- (present on admission) Patient denies any complaints of diarrhea.  Home regimen includes sulfasalazine 500 mg 3 times daily.  GERD (gastroesophageal reflux disease)- (present on admission) Home medication regimen includes Nexium. -Continue pharmacy substitution of Protonix   Elevated troponin- (present on admission) Acute.  Initial high-sensitivity troponin 21 but repeat check 15.  Patient denied any complaints of chest pain.  Suspect secondary to demand in the setting of hypotension. -Continue to monitor  Dementia without behavioral disturbance ([place])- (present on admission) Patient does have dementia, but able to give most of history. -Delirium precautions  Urinary tract infection Prior to arrival.  Patient had just recently completed a 5-day course of Bactrim on 2/12 after being diagnosed with urinary tract infection in the outpatient setting.  A urine culture has been sent, but urinalysis cannot initially be obtained due to lack of urine present on In-N-Out cath.  Nursing noted patient to have concern for yeast infection. -Follow-up urine culture  -Follow-up urinalysis once able to be  obtained -Empirically given Diflucan 100 mg due to kidney function, but  will supplement with clotrimazole 2% vaginal cream x3  Days.       Advance Care Planning:   Code Status: Full Code   Consults: None  Family Communication: POA updated over the phone  Severity of Illness: The appropriate patient status for this patient is INPATIENT. Inpatient status is judged to be reasonable and necessary in order to provide the required intensity of service to ensure the patient's safety. The patient's presenting symptoms, physical exam findings, and initial radiographic and laboratory data in the context of their chronic comorbidities is felt to place them at high risk for further clinical deterioration. Furthermore, it  is not anticipated that the patient will be medically stable for discharge from the hospital within 2 midnights of admission.   * I certify that at the point of admission it is my clinical judgment that the patient will require inpatient hospital care spanning beyond 2 midnights from the point of admission due to high intensity of service, high risk for further deterioration and high frequency of surveillance required.*  Author: Norval Morton, MD 12/16/2021 4:55 PM  For on call review [email or web address].

## 2021-12-16 NOTE — Assessment & Plan Note (Signed)
Home medication regimen includes Nexium. -Continue pharmacy substitution of Protonix

## 2021-12-16 NOTE — Assessment & Plan Note (Signed)
Patient does have dementia, but able to give most of history. -Delirium precautions

## 2021-12-16 NOTE — Assessment & Plan Note (Signed)
Acute.  Initial high-sensitivity troponin 21 but repeat check 15.  Patient denied any complaints of chest pain.  Suspect secondary to demand in the setting of hypotension. -Continue to monitor

## 2021-12-17 DIAGNOSIS — I4891 Unspecified atrial fibrillation: Secondary | ICD-10-CM

## 2021-12-17 DIAGNOSIS — A419 Sepsis, unspecified organism: Secondary | ICD-10-CM

## 2021-12-17 LAB — BLOOD CULTURE ID PANEL (REFLEXED) - BCID2

## 2021-12-17 LAB — BASIC METABOLIC PANEL
Anion gap: 12 (ref 5–15)
BUN: 32 mg/dL — ABNORMAL HIGH (ref 8–23)
CO2: 18 mmol/L — ABNORMAL LOW (ref 22–32)
Calcium: 9.6 mg/dL (ref 8.9–10.3)
Chloride: 100 mmol/L (ref 98–111)
Creatinine, Ser: 1.66 mg/dL — ABNORMAL HIGH (ref 0.44–1.00)
GFR, Estimated: 30 mL/min — ABNORMAL LOW (ref >60)
Glucose, Bld: 91 mg/dL (ref 70–99)
Potassium: 4.5 mmol/L (ref 3.5–5.1)
Sodium: 130 mmol/L — ABNORMAL LOW (ref 135–145)

## 2021-12-17 LAB — CBC
HCT: 38.1 % (ref 36.0–46.0)
Hemoglobin: 12.1 g/dL (ref 12.0–15.0)
MCH: 30.8 pg (ref 26.0–34.0)
MCHC: 31.8 g/dL (ref 30.0–36.0)
MCV: 96.9 fL (ref 80.0–100.0)
Platelets: 157 10*3/uL (ref 150–400)
RBC: 3.93 MIL/uL (ref 3.87–5.11)
RDW: 13.3 % (ref 11.5–15.5)
WBC: 12.2 10*3/uL — ABNORMAL HIGH (ref 4.0–10.5)
nRBC: 0 % (ref 0.0–0.2)

## 2021-12-17 LAB — URINALYSIS, ROUTINE W REFLEX MICROSCOPIC
Bilirubin Urine: NEGATIVE
Glucose, UA: NEGATIVE mg/dL
Ketones, ur: 20 mg/dL — AB
Nitrite: NEGATIVE
Protein, ur: 100 mg/dL — AB
Specific Gravity, Urine: 1.023 (ref 1.005–1.030)
WBC, UA: >50 WBC/hpf — ABNORMAL HIGH (ref 0–5)
pH: 5 (ref 5.0–8.0)

## 2021-12-17 LAB — MAGNESIUM: Magnesium: 2.1 mg/dL (ref 1.7–2.4)

## 2021-12-17 LAB — SODIUM, URINE, RANDOM: Sodium, Ur: 33 mmol/L

## 2021-12-17 LAB — CREATININE, URINE, RANDOM: Creatinine, Urine: 151.89 mg/dL

## 2021-12-17 MED ORDER — SODIUM CHLORIDE 0.9 % IV SOLN
1.0000 g | Freq: Two times a day (BID) | INTRAVENOUS | Status: DC
  Administered 2021-12-17 – 2021-12-20 (×7): 1 g via INTRAVENOUS
  Filled 2021-12-17 (×8): qty 20

## 2021-12-17 NOTE — Progress Notes (Signed)
PHARMACY - PHYSICIAN COMMUNICATION CRITICAL VALUE ALERT - BLOOD CULTURE IDENTIFICATION (BCID)  Autumn Dean is an 88 y.o. female who presented to [facility] on 12/16/2021 with a chief complaint of hypotension, recent UTI treated with Bactrim  Assessment:   2/2 blood cultures growing ESBL CTX-M producing Klebsiella pneumiae  Name of physician (or Provider) Contacted:  Dr. Myna Hidalgo  Current antibiotics: None  Changes to prescribed antibiotics recommended:  Start meropenem 1 g IV q12h  Results for orders placed or performed during the hospital encounter of 12/16/21  Blood Culture ID Panel (Reflexed) (Collected: 12/16/2021  2:30 PM)  Result Value Ref Range   Enterococcus faecalis NOT DETECTED NOT DETECTED   Enterococcus Faecium NOT DETECTED NOT DETECTED   Listeria monocytogenes NOT DETECTED NOT DETECTED   Staphylococcus species NOT DETECTED NOT DETECTED   Staphylococcus aureus (BCID) NOT DETECTED NOT DETECTED   Staphylococcus epidermidis NOT DETECTED NOT DETECTED   Staphylococcus lugdunensis NOT DETECTED NOT DETECTED   Streptococcus species NOT DETECTED NOT DETECTED   Streptococcus agalactiae NOT DETECTED NOT DETECTED   Streptococcus pneumoniae NOT DETECTED NOT DETECTED   Streptococcus pyogenes NOT DETECTED NOT DETECTED   A.calcoaceticus-baumannii NOT DETECTED NOT DETECTED   Bacteroides fragilis NOT DETECTED NOT DETECTED   Enterobacterales DETECTED (A) NOT DETECTED   Enterobacter cloacae complex NOT DETECTED NOT DETECTED   Escherichia coli NOT DETECTED NOT DETECTED   Klebsiella aerogenes NOT DETECTED NOT DETECTED   Klebsiella oxytoca NOT DETECTED NOT DETECTED   Klebsiella pneumoniae DETECTED (A) NOT DETECTED   Proteus species NOT DETECTED NOT DETECTED   Salmonella species NOT DETECTED NOT DETECTED   Serratia marcescens NOT DETECTED NOT DETECTED   Haemophilus influenzae NOT DETECTED NOT DETECTED   Neisseria meningitidis NOT DETECTED NOT DETECTED   Pseudomonas aeruginosa NOT  DETECTED NOT DETECTED   Stenotrophomonas maltophilia NOT DETECTED NOT DETECTED   Candida albicans NOT DETECTED NOT DETECTED   Candida auris NOT DETECTED NOT DETECTED   Candida glabrata NOT DETECTED NOT DETECTED   Candida krusei NOT DETECTED NOT DETECTED   Candida parapsilosis NOT DETECTED NOT DETECTED   Candida tropicalis NOT DETECTED NOT DETECTED   Cryptococcus neoformans/gattii NOT DETECTED NOT DETECTED   CTX-M ESBL DETECTED (A) NOT DETECTED   Carbapenem resistance IMP NOT DETECTED NOT DETECTED   Carbapenem resistance KPC NOT DETECTED NOT DETECTED   Carbapenem resistance NDM NOT DETECTED NOT DETECTED   Carbapenem resist OXA 48 LIKE NOT DETECTED NOT DETECTED   Carbapenem resistance VIM NOT DETECTED NOT DETECTED    [name] 12/17/2021  6:39 AM

## 2021-12-17 NOTE — Progress Notes (Signed)
PROGRESS NOTE    Autumn Dean  ZSW:[id number] DOB: Feb 10, 1933 DOA: 12/16/2021 PCP: Glenda Chroman, MD    Chief Complaint  Patient presents with   Hypotension   Weakness    Brief Narrative:     Autumn Dean is a 88 y.o. female with medical history significant of dementia, hypertension, ulcerative colitis, and GERD who presents with complaints of not feeling well and weak.  She had recently been placed on Bactrim for concern for UTI and completed a 5-day course on 2/12.  She reports that she still has had some dysuria and had not been feeling well.  Denies having any nausea, vomiting, diarrhea, chest pain, or recent falls. She is a retired [employer] scientist.  It was noted to be hypotensive, responded to fluid bolus, as well with AKI creatinine of 2.3, Influenza and COVID-19 screening were negative.  Blood and urine cultures were obtained.  Assessment & Plan:   Principal Problem:   Hypotension Active Problems:   GERD (gastroesophageal reflux disease)   Left sided ulcerative colitis ([place])   Urinary tract infection   AKI (acute kidney injury) (HCC)   Paroxysmal atrial fibrillation (HCC)   Generalized weakness   Dementia without behavioral disturbance (HCC)   Chronic diastolic CHF (congestive heart failure) (HCC)   Elevated troponin   Sepsis present on admission due to ESBL bacteremia UTI  -Sepsis present on admission, evidenced with leukocytosis, hypotension, and AKI due to UTI. -Blood culture significant for ESBL, patient reports urinary symptoms dysuria and polyuria, follow on final urinary cultures. -Continue with IV meropenem. -Continue with IV fluids. -Hold antihypertensive medications. - Patient had just recently completed a 5-day course of Bactrim on 2/12 after being diagnosed with urinary tract infection in the outpatient setting, likely that was not helpful given it is ESBL.    Paroxysmal atrial fibrillation ([place])- (present on admission) Patient is in atrial fibrillation,  but appears rate controlled at this time.  Unclear if this is new or this is known as patient is on anticoagulation and has history of valve heart issue. -Continue Eliquis.  Will resume Coreg once blood pressures maintained stable   AKI (acute kidney injury) ([place])- (present on admission) -Baseline creatinine 0.8, elevated at 2.3, improving with IV fluids . -Continue to hold nephrotoxic medications  -Continue with IV fluids    Chronic diastolic CHF (congestive heart failure) ([place])- (present on admission) Patient has a history of diastolic CHF with last EF noted to be 60- 65% with grade 2 diastolic dysfunction back in 2015.  -Hypotensive on presentation, no evidence of volume overload, continue to monitor closely as on IV fluids.     Left sided ulcerative colitis ([place])- (present on admission) Patient denies any complaints of diarrhea.  Home regimen includes sulfasalazine 500 mg 3 times daily.   GERD (gastroesophageal reflux disease)- (present on admission) Home medication regimen includes Nexium. -Continue pharmacy substitution of Protonix     Elevated troponin- (present on admission) Acute.  Initial high-sensitivity troponin 21 but repeat check 15.  Patient denied any complaints of chest pain.  Suspect secondary to demand in the setting of hypotension. -Continue to monitor   Dementia without behavioral disturbance ([place])- (present on admission) Patient does have dementia, but able to give most of history. -Delirium precautions            DVT prophylaxis: Eliquis Code Status: DNR Family Communication: Discussed with brother by phone Disposition:   Status is: Inpatient Remains inpatient appropriate because: bacteremia  Consultants:  None   Subjective:  Reports generalized weakness, fatigue and dysuria.  Objective: Vitals:   12/17/21 0000 12/17/21 0400 12/17/21 0750 12/17/21 1208  BP: 128/63 121/67 121/78 121/61  Pulse: 98 (!) 101 95 96  Resp: 20 19  20 17   Temp: 98.2 F (36.8 C) 98.5 F (36.9 C) (!) 97.3 F (36.3 C) 98 F (36.7 C)  TempSrc: Oral Oral Oral Oral  SpO2: 95% 94% 92% 90%   No intake or output data in the 24 hours ending 12/17/21 1231 There were no vitals filed for this visit.  Examination:   Awake Alert, frail, pleasant Symmetrical Chest wall movement, Good air movement bilaterally, CTAB RRR,No Gallops,Rubs or new Murmurs, No Parasternal Heave +ve B.Sounds, Abd Soft, No tenderness, No rebound - guarding or rigidity. No Cyanosis, Clubbing or edema, No new Rash or bruise       Data Reviewed: I have personally reviewed following labs and imaging studies  CBC: Recent Labs  Lab 12/16/21 1353 12/17/21 0152  WBC 15.5* 12.2*  NEUTROABS 14.5*  --   HGB 12.5 12.1  HCT 38.6 38.1  MCV 96.5 96.9  PLT 179 371    Basic Metabolic Panel: Recent Labs  Lab 12/16/21 1353 12/17/21 0152  NA 129* 130*  K 4.4 4.5  CL 98 100  CO2 21* 18*  GLUCOSE 97 91  BUN 31* 32*  CREATININE 2.33* 1.66*  CALCIUM 9.4 9.6  MG 2.2 2.1    GFR: CrCl cannot be calculated (Unknown ideal weight.).  Liver Function Tests: Recent Labs  Lab 12/16/21 1353  AST 19  ALT 11  ALKPHOS 75  BILITOT 0.7  PROT 6.0*  ALBUMIN 3.1*    CBG: No results for input(s): GLUCAP in the last 168 hours.   Recent Results (from the past 240 hour(s))  Resp Panel by RT-PCR (Flu A&B, Covid) Nasopharyngeal Swab     Status: None   Collection Time: 12/16/21  2:00 PM   Specimen: Nasopharyngeal Swab; Nasopharyngeal(NP) swabs in vial transport medium  Result Value Ref Range Status   SARS Coronavirus 2 by RT PCR NEGATIVE NEGATIVE Final    Comment: (NOTE) SARS-CoV-2 target nucleic acids are NOT DETECTED.  The SARS-CoV-2 RNA is generally detectable in upper respiratory specimens during the acute phase of infection. The lowest concentration of SARS-CoV-2 viral copies this assay can detect is 138 copies/mL. A negative result does not preclude  SARS-Cov-2 infection and should not be used as the sole basis for treatment or other patient management decisions. A negative result may occur with  improper specimen collection/handling, submission of specimen other than nasopharyngeal swab, presence of viral mutation(s) within the areas targeted by this assay, and inadequate number of viral copies(<138 copies/mL). A negative result must be combined with clinical observations, patient history, and epidemiological information. The expected result is Negative.  Fact Sheet for Patients:  EntrepreneurPulse.com.au  Fact Sheet for Healthcare Providers:  IncredibleEmployment.be  This test is no t yet approved or cleared by the [place] FDA and  has been authorized for detection and/or diagnosis of SARS-CoV-2 by FDA under an Emergency Use Authorization (EUA). This EUA will remain  in effect (meaning this test can be used) for the duration of the COVID-19 declaration under Section 564(b)(1) of the Act, 21 U.S.C.section 360bbb-3(b)(1), unless the authorization is terminated  or revoked sooner.       Influenza A by PCR NEGATIVE NEGATIVE Final   Influenza B by PCR NEGATIVE NEGATIVE Final    Comment: (NOTE) The  Xpert Xpress SARS-CoV-2/FLU/RSV plus assay is intended as an aid in the diagnosis of influenza from Nasopharyngeal swab specimens and should not be used as a sole basis for treatment. Nasal washings and aspirates are unacceptable for Xpert Xpress SARS-CoV-2/FLU/RSV testing.  Fact Sheet for Patients: EntrepreneurPulse.com.au  Fact Sheet for Healthcare Providers: IncredibleEmployment.be  This test is not yet approved or cleared by the [place] FDA and has been authorized for detection and/or diagnosis of SARS-CoV-2 by FDA under an Emergency Use Authorization (EUA). This EUA will remain in effect (meaning this test can be used) for the duration of  the COVID-19 declaration under Section 564(b)(1) of the Act, 21 U.S.C. section 360bbb-3(b)(1), unless the authorization is terminated or revoked.  Performed at [facility], [place] [address] [place], [place] [address]   Urine Culture     Status: Abnormal (Preliminary result)   Collection Time: 12/16/21  2:25 PM   Specimen: Urine, Clean Catch  Result Value Ref Range Status   Specimen Description URINE, CLEAN CATCH  Final   Special Requests   Final    NONE Performed at [facility], [place] [address], [place], [place] [address]    Culture >=100,000 COLONIES/mL KLEBSIELLA PNEUMONIAE (A)  Final   Report Status PENDING  Incomplete  Blood culture (routine x 2)     Status: None (Preliminary result)   Collection Time: 12/16/21  2:30 PM   Specimen: BLOOD RIGHT ARM  Result Value Ref Range Status   Specimen Description BLOOD RIGHT ARM  Final   Special Requests   Final    BOTTLES DRAWN AEROBIC AND ANAEROBIC Blood Culture adequate volume   Culture  Setup Time   Final    IN BOTH AEROBIC AND ANAEROBIC BOTTLES GRAM NEGATIVE RODS CRITICAL RESULT CALLED TO, READ BACK BY AND VERIFIED WITH: PHARMD GREG ABBOTT 12/17/21@6 :35 BY TW Performed at [facility], [place] [address], [place], [place] [address]    Culture GRAM NEGATIVE RODS  Final   Report Status PENDING  Incomplete  Blood Culture ID Panel (Reflexed)     Status: Abnormal   Collection Time: 12/16/21  2:30 PM  Result Value Ref Range Status   Enterococcus faecalis NOT DETECTED NOT DETECTED Final   Enterococcus Faecium NOT DETECTED NOT DETECTED Final   Listeria monocytogenes NOT DETECTED NOT DETECTED Final   Staphylococcus species NOT DETECTED NOT DETECTED Final   Staphylococcus aureus (BCID) NOT DETECTED NOT DETECTED Final   Staphylococcus epidermidis NOT DETECTED NOT DETECTED Final   Staphylococcus lugdunensis NOT DETECTED NOT DETECTED Final   Streptococcus species NOT DETECTED NOT DETECTED Final   Streptococcus agalactiae NOT  DETECTED NOT DETECTED Final   Streptococcus pneumoniae NOT DETECTED NOT DETECTED Final   Streptococcus pyogenes NOT DETECTED NOT DETECTED Final   A.calcoaceticus-baumannii NOT DETECTED NOT DETECTED Final   Bacteroides fragilis NOT DETECTED NOT DETECTED Final   Enterobacterales DETECTED (A) NOT DETECTED Final    Comment: Enterobacterales represent a large order of gram negative bacteria, not a single organism. CRITICAL RESULT CALLED TO, READ BACK BY AND VERIFIED WITH: PHARMD GREG ABBOTT 12/17/21@6 :35 BY TW    Enterobacter cloacae complex NOT DETECTED NOT DETECTED Final   Escherichia coli NOT DETECTED NOT DETECTED Final   Klebsiella aerogenes NOT DETECTED NOT DETECTED Final   Klebsiella oxytoca NOT DETECTED NOT DETECTED Final   Klebsiella pneumoniae DETECTED (A) NOT DETECTED Final    Comment: CRITICAL RESULT CALLED TO, READ BACK BY AND VERIFIED WITH: PHARMD GREG ABBOTT 12/17/21@6 :35 BY TW  Proteus species NOT DETECTED NOT DETECTED Final   Salmonella species NOT DETECTED NOT DETECTED Final   Serratia marcescens NOT DETECTED NOT DETECTED Final   Haemophilus influenzae NOT DETECTED NOT DETECTED Final   Neisseria meningitidis NOT DETECTED NOT DETECTED Final   Pseudomonas aeruginosa NOT DETECTED NOT DETECTED Final   Stenotrophomonas maltophilia NOT DETECTED NOT DETECTED Final   Candida albicans NOT DETECTED NOT DETECTED Final   Candida auris NOT DETECTED NOT DETECTED Final   Candida glabrata NOT DETECTED NOT DETECTED Final   Candida krusei NOT DETECTED NOT DETECTED Final   Candida parapsilosis NOT DETECTED NOT DETECTED Final   Candida tropicalis NOT DETECTED NOT DETECTED Final   Cryptococcus neoformans/gattii NOT DETECTED NOT DETECTED Final   CTX-M ESBL DETECTED (A) NOT DETECTED Final    Comment: CRITICAL RESULT CALLED TO, READ BACK BY AND VERIFIED WITH: PHARMD GREG ABBOTT 12/17/21@6 :35 BY TW (NOTE) Extended spectrum beta-lactamase detected. Recommend a carbapenem as initial  therapy.      Carbapenem resistance IMP NOT DETECTED NOT DETECTED Final   Carbapenem resistance KPC NOT DETECTED NOT DETECTED Final   Carbapenem resistance NDM NOT DETECTED NOT DETECTED Final   Carbapenem resist OXA 48 LIKE NOT DETECTED NOT DETECTED Final   Carbapenem resistance VIM NOT DETECTED NOT DETECTED Final    Comment: Performed at [facility], [place] [address], [place], [place] [address]  Blood culture (routine x 2)     Status: None (Preliminary result)   Collection Time: 12/16/21  2:40 PM   Specimen: BLOOD RIGHT ARM  Result Value Ref Range Status   Specimen Description BLOOD RIGHT ARM  Final   Special Requests   Final    BOTTLES DRAWN AEROBIC AND ANAEROBIC Blood Culture adequate volume   Culture  Setup Time   Final    GRAM NEGATIVE RODS IN BOTH AEROBIC AND ANAEROBIC BOTTLES CRITICAL VALUE NOTED.  VALUE IS CONSISTENT WITH PREVIOUSLY REPORTED AND CALLED VALUE.    Culture   Final    NO GROWTH < 24 HOURS Performed at [facility], [place] [address], [place], [place] [address]    Report Status PENDING  Incomplete         Radiology Studies: DG Chest Portable 1 View  Result Date: 12/16/2021 CLINICAL DATA:  Lightheadedness EXAM: PORTABLE CHEST 1 VIEW COMPARISON:  None. FINDINGS: The heart size and mediastinal contours are within normal limits. Aortic atherosclerosis. No focal airspace consolidation, pleural effusion, or pneumothorax. The visualized skeletal structures are unremarkable. IMPRESSION: No active disease. Electronically Signed   By: Davina Poke D.O.   On: 12/16/2021 14:11        Scheduled Meds:  apixaban  2.5 mg Oral BID   busPIRone  5 mg Oral BID   carvedilol  3.125 mg Oral BID   clotrimazole  1 Applicatorful Vaginal QHS   lidocaine  1 patch Transdermal q morning   pantoprazole  40 mg Oral Daily   senna-docusate  1 tablet Oral Q M,W,F-1800   sodium chloride flush  3 mL Intravenous Q12H   sulfaSALAzine  500 mg Oral TID   Continuous  Infusions:  sodium chloride 75 mL/hr at 12/16/21 1839   meropenem (MERREM) IV 1 g (12/17/21 0838)     LOS: 1 day        Phillips Climes, MD [facility]   To contact the attending provider between 7A-7P or the covering provider during after hours 7P-7A, please log into the web site [email or web address] and access using universal [place]  password for that web site. If you do not have the password, please call the hospital operator.  12/17/2021, 12:31 PM

## 2021-12-17 NOTE — Evaluation (Signed)
Physical Therapy Evaluation Patient Details Name: Autumn Dean MRN: [id number] DOB: 06-Jul-1933 Today's Date: 12/17/2021  History of Present Illness  88 y.o. female with medical history significant of dementia, hypertension, ulcerative colitis, and GERD who presents 12/16/21 with complaints of not feeling well and weak. +hypotension, afib, AKI, CHF  Clinical Impression   Pt admitted with above diagnosis. Prior to admission pt was a resident of [facility]. Uncertain what her functional mobility level was--she reports walking without a device (with ?accuracy). Patient currently able to stand and march in place with RW with min assist. Anticipate pt can return to ALF, however may benefit from [facility] if determined she has had a decline in functional status.   Pt currently with functional limitations due to the deficits listed below (see PT Problem List). Pt will benefit from skilled PT to increase their independence and safety with mobility to allow discharge to the venue listed below.          Recommendations for follow up therapy are one component of a multi-disciplinary discharge planning process, led by the attending physician.  Recommendations may be updated based on patient status, additional functional criteria and insurance authorization.  Follow Up Recommendations Home health PT (at ALF unless determined pt is at her baseline)    Assistance Recommended at Discharge Intermittent Supervision/Assistance  Patient can return home with the following  A little help with walking and/or transfers;A little help with bathing/dressing/bathroom;Assistance with cooking/housework;Direct supervision/assist for medications management;Direct supervision/assist for financial management;Help with stairs or ramp for entrance    Equipment Recommendations Other (comment) (continue to assess if RW beneficial)  Recommendations for Other Services  OT consult    Functional Status Assessment Patient  has had a recent decline in their functional status and demonstrates the ability to make significant improvements in function in a reasonable and predictable amount of time.     Precautions / Restrictions Precautions Precautions: Fall Restrictions Weight Bearing Restrictions: No      Mobility  Bed Mobility               General bed mobility comments: up in recliner on arrival and departure    Transfers Overall transfer level: Needs assistance Equipment used: Rolling walker (2 wheels) Transfers: Sit to/from Stand Sit to Stand: Min assist           General transfer comment: from recliner, using armrests; cues to scoot forward, slight boost initial transfer; second attempt minguard assist    Ambulation/Gait             Pre-gait activities: marching in place x 2 rounds ~20 steps each; bil UE support via RW with minguard assist; unsafe to attempt ambulation with multiple lines, pt HOH with mild confusion    Stairs            Wheelchair Mobility    Modified Rankin (Stroke Patients Only)       Balance Overall balance assessment: Mild deficits observed, not formally tested                                           Pertinent Vitals/Pain Pain Assessment Pain Assessment: No/denies pain    Home Living Family/patient expects to be discharged to:: Assisted living                 Home Equipment: Kasandra Knudsen - single point  Prior Function Prior Level of Function : Patient poor historian/Family not available             Mobility Comments: pt states she walks without a device (?accuracy)       Hand Dominance        Extremity/Trunk Assessment   Upper Extremity Assessment Upper Extremity Assessment: Generalized weakness    Lower Extremity Assessment Lower Extremity Assessment: Generalized weakness    Cervical / Trunk Assessment Cervical / Trunk Assessment: Kyphotic  Communication   Communication: HOH  Cognition  Arousal/Alertness: Awake/alert Behavior During Therapy: WFL for tasks assessed/performed Overall Cognitive Status: No family/caregiver present to determine baseline cognitive functioning                                 General Comments: oriented to self, situation (UTI), month, year however not oriented to place; required incr time to respond to commands (?due to decr hearing)        General Comments      Exercises     Assessment/Plan    PT Assessment Patient needs continued PT services  PT Problem List Decreased strength;Decreased balance;Decreased mobility;Decreased cognition;Decreased knowledge of use of DME;Decreased knowledge of precautions       PT Treatment Interventions DME instruction;Gait training;Functional mobility training;Therapeutic activities;Therapeutic exercise;Balance training;Cognitive remediation;Patient/family education    PT Goals (Current goals can be found in the Care Plan section)  Acute Rehab PT Goals Patient Stated Goal: agrees wants to get stronger and walk PT Goal Formulation: With patient Time For Goal Achievement: 12/31/21 Potential to Achieve Goals: Good    Frequency Min 3X/week     Co-evaluation               AM-PAC PT "6 Clicks" Mobility  Outcome Measure Help needed turning from your back to your side while in a flat bed without using bedrails?: A Little Help needed moving from lying on your back to sitting on the side of a flat bed without using bedrails?: A Little Help needed moving to and from a bed to a chair (including a wheelchair)?: A Little Help needed standing up from a chair using your arms (e.g., wheelchair or bedside chair)?: A Little Help needed to walk in hospital room?: Total Help needed climbing 3-5 steps with a railing? : Total 6 Click Score: 14    End of Session   Activity Tolerance: Patient tolerated treatment well Patient left: in chair;with call bell/phone within reach;with chair alarm  set Nurse Communication: Mobility status PT Visit Diagnosis: Unsteadiness on feet (R26.81);Muscle weakness (generalized) (M62.81)    Time: 1145-1200 PT Time Calculation (min) (ACUTE ONLY): 15 min   Charges:   PT Evaluation $PT Eval Low Complexity: [place], PT Acute Rehabilitation Services  Pager [phone] Office [phone]   Rexanne Mano 12/17/2021, 12:25 PM

## 2021-12-18 LAB — BASIC METABOLIC PANEL
Anion gap: 9 (ref 5–15)
BUN: 21 mg/dL (ref 8–23)
CO2: 21 mmol/L — ABNORMAL LOW (ref 22–32)
Calcium: 9.5 mg/dL (ref 8.9–10.3)
Chloride: 101 mmol/L (ref 98–111)
Creatinine, Ser: 0.92 mg/dL (ref 0.44–1.00)
GFR, Estimated: >60 mL/min (ref >60)
Glucose, Bld: 121 mg/dL — ABNORMAL HIGH (ref 70–99)
Potassium: 4.6 mmol/L (ref 3.5–5.1)
Sodium: 131 mmol/L — ABNORMAL LOW (ref 135–145)

## 2021-12-18 LAB — CBC
HCT: 34.3 % — ABNORMAL LOW (ref 36.0–46.0)
Hemoglobin: 11 g/dL — ABNORMAL LOW (ref 12.0–15.0)
MCH: 30.4 pg (ref 26.0–34.0)
MCHC: 32.1 g/dL (ref 30.0–36.0)
MCV: 94.8 fL (ref 80.0–100.0)
Platelets: 152 10*3/uL (ref 150–400)
RBC: 3.62 MIL/uL — ABNORMAL LOW (ref 3.87–5.11)
RDW: 13.2 % (ref 11.5–15.5)
WBC: 6.6 10*3/uL (ref 4.0–10.5)
nRBC: 0 % (ref 0.0–0.2)

## 2021-12-18 LAB — URINE CULTURE: Culture: >=100000 — AB

## 2021-12-18 NOTE — Progress Notes (Addendum)
PROGRESS NOTE        PATIENT DETAILS Name: JENAFER WINTERTON Age: 88 y.o. Sex: female Date of Birth: 07/29/1933 Admit Date: 12/16/2021 Admitting Physician Norval Morton, MD BWG:YKZL, Costella Hatcher, MD  Brief Summary: Patient is a 88 y.o.  female with history of dementia, HTN, ulcerative colitis, 55 recently was treated for UTI with Bactrim-presented to the hospital with weakness-patient was subsequently found to have ESBL Klebsiella pneumonia bacteremia.  See below for further details.  Significant Hospital events: 2/13>> admit to [facility] for weakness/hypotension-found to have ESBL Klebsiella bacteremia  Significant imaging studies: 2/13>> CXR: No PNA  Significant microbiology data: 2/13>> COVID/influenza PCR: Negative 2/13>> urine culture: ESBL Klebsiella pneumoniae 2/13>> blood culture: Klebsiella pneumoniae  Procedures: None  Consults:  None  Subjective: Lying comfortably in bed-denies any chest pain or shortness of breath.  Objective: Vitals: Blood pressure 125/85, pulse 93, temperature 98.1 F (36.7 C), resp. rate 20, height 5' 3"  (1.6 m), weight 58.5 kg, SpO2 98 %.   Exam: Gen Exam:Alert awake-not in any distress HEENT:atraumatic, normocephalic Chest: B/L clear to auscultation anteriorly CVS:S1S2 regular Abdomen:soft non tender, non distended Extremities:no edema Neurology: Non focal Skin: no rash  Pertinent Labs/Radiology: CBC Latest Ref Rng & Units 12/18/2021 12/17/2021 12/16/2021  WBC 4.0 - 10.5 K/uL 6.6 12.2(H) 15.5(H)  Hemoglobin 12.0 - 15.0 g/dL 11.0(L) 12.1 12.5  Hematocrit 36.0 - 46.0 % 34.3(L) 38.1 38.6  Platelets 150 - 400 K/uL 152 157 179    Lab Results  Component Value Date   NA 131 (L) 12/18/2021   K 4.6 12/18/2021   CL 101 12/18/2021   CO2 21 (L) 12/18/2021     Assessment/Plan: Sepsis due to ESBL Klebsiella UTI and bacteremia: Sepsis physiology has resolved-on IV meropenem.  We will plan at least a 7-day  course.  AKI: Likely hemodynamically mediated in the setting of sepsis-resolved with supportive care.  Chronic HFpEF: Euvolemic-continue to watch electrolytes closely.  PAF: Maintaining sinus rhythm-on Coreg-remains on Eliquis.  Minimally elevated troponin: Of no clinical significance.  No chest pain/anginal symptoms.  Hyponatremia: Mild-appears to be chronic-stable for monitoring periodically.  History of ulcerative colitis: No flare-continue sulfasalazine  GERD: Continue PPI  Dementia without behavioral disturbance: Appears to be mild-able to give history/answer questions appropriately.  Maintain delirium precautions.  Debility/deconditioning: Due to acute illness-evaluated by PT/OT with recommendations for SNF.  BMI: Estimated body mass index is 22.85 kg/m as calculated from the following:   Height as of this encounter: 5' 3"  (1.6 m).   Weight as of this encounter: 58.5 kg.   Pressure Injury Pressure Injury 12/16/21 Buttocks Right;Left Stage 1 -  Intact skin with non-blanchable redness of a localized area usually over a bony prominence. Bruising (Active)  12/16/21 2319  Location: Buttocks  Location Orientation: Right;Left  Staging: Stage 1 -  Intact skin with non-blanchable redness of a localized area usually over a bony prominence.  Wound Description (Comments): Bruising  Present on Admission: Yes     Code status:   Code Status: DNR   DVT Prophylaxis: apixaban (ELIQUIS) tablet 2.5 mg Start: 12/16/21 2200 apixaban (ELIQUIS) tablet 2.5 mg     Family Communication: Brother-John Curtner-[phone] over the phone on 2/15.  Disposition Plan: Status is: Inpatient Remains inpatient appropriate because: Resolving sepsis physiology from ESBL Klebsiella bacteremia.  Not yet stable for discharge.   Planned Discharge Destination:Skilled nursing  facility   Diet: Diet Order             Diet Heart Room service appropriate? Yes; Fluid consistency: Thin  Diet effective now                      Antimicrobial agents: Anti-infectives (From admission, onward)    Start     Dose/Rate Route Frequency Ordered Stop   12/17/21 0800  meropenem (MERREM) 1 g in sodium chloride 0.9 % 100 mL IVPB        1 g 200 mL/hr over 30 Minutes Intravenous Every 12 hours 12/17/21 0643     12/16/21 1745  fluconazole (DIFLUCAN) tablet 100 mg        100 mg Oral  Once 12/16/21 1742 12/16/21 1833        MEDICATIONS: Scheduled Meds:  apixaban  2.5 mg Oral BID   busPIRone  5 mg Oral BID   carvedilol  3.125 mg Oral BID   clotrimazole  1 Applicatorful Vaginal QHS   lidocaine  1 patch Transdermal q morning   pantoprazole  40 mg Oral Daily   senna-docusate  1 tablet Oral Q M,W,F-1800   sodium chloride flush  3 mL Intravenous Q12H   sulfaSALAzine  500 mg Oral TID   Continuous Infusions:  sodium chloride 75 mL/hr at 12/18/21 0524   meropenem (MERREM) IV 1 g (12/18/21 0823)   PRN Meds:.acetaminophen **OR** acetaminophen, albuterol, fluticasone, Gerhardt's butt cream, liver oil-zinc oxide, magnesium hydroxide   I have personally reviewed following labs and imaging studies  LABORATORY DATA: CBC: Recent Labs  Lab 12/16/21 1353 12/17/21 0152 12/18/21 0125  WBC 15.5* 12.2* 6.6  NEUTROABS 14.5*  --   --   HGB 12.5 12.1 11.0*  HCT 38.6 38.1 34.3*  MCV 96.5 96.9 94.8  PLT 179 157 161    Basic Metabolic Panel: Recent Labs  Lab 12/16/21 1353 12/17/21 0152 12/18/21 0125  NA 129* 130* 131*  K 4.4 4.5 4.6  CL 98 100 101  CO2 21* 18* 21*  GLUCOSE 97 91 121*  BUN 31* 32* 21  CREATININE 2.33* 1.66* 0.92  CALCIUM 9.4 9.6 9.5  MG 2.2 2.1  --     GFR: Estimated Creatinine Clearance: 36.3 mL/min (by C-G formula based on SCr of 0.92 mg/dL).  Liver Function Tests: Recent Labs  Lab 12/16/21 1353  AST 19  ALT 11  ALKPHOS 75  BILITOT 0.7  PROT 6.0*  ALBUMIN 3.1*   No results for input(s): LIPASE, AMYLASE in the last 168 hours. Recent Labs  Lab 12/16/21 1354   AMMONIA 30    Coagulation Profile: Recent Labs  Lab 12/16/21 1353  INR 1.1    Cardiac Enzymes: No results for input(s): CKTOTAL, CKMB, CKMBINDEX, TROPONINI in the last 168 hours.  BNP (last 3 results) No results for input(s): PROBNP in the last 8760 hours.  Lipid Profile: No results for input(s): CHOL, HDL, LDLCALC, TRIG, CHOLHDL, LDLDIRECT in the last 72 hours.  Thyroid Function Tests: Recent Labs    12/16/21 1354  TSH 1.876    Anemia Panel: No results for input(s): VITAMINB12, FOLATE, FERRITIN, TIBC, IRON, RETICCTPCT in the last 72 hours.  Urine analysis:    Component Value Date/Time   COLORURINE AMBER (A) 12/17/2021 1135   APPEARANCEUR CLOUDY (A) 12/17/2021 1135   APPEARANCEUR Clear 10/05/2020 1330   LABSPEC 1.023 12/17/2021 1135   PHURINE 5.0 12/17/2021 1135   GLUCOSEU NEGATIVE 12/17/2021 1135   HGBUR SMALL (A)  12/17/2021 1135   BILIRUBINUR NEGATIVE 12/17/2021 1135   BILIRUBINUR Negative 10/05/2020 1330   KETONESUR 20 (A) 12/17/2021 1135   PROTEINUR 100 (A) 12/17/2021 1135   UROBILINOGEN 0.2 05/08/2015 1505   NITRITE NEGATIVE 12/17/2021 1135   LEUKOCYTESUR LARGE (A) 12/17/2021 1135    Sepsis Labs: Lactic Acid, Venous No results found for: LATICACIDVEN  MICROBIOLOGY: Recent Results (from the past 240 hour(s))  Resp Panel by RT-PCR (Flu A&B, Covid) Nasopharyngeal Swab     Status: None   Collection Time: 12/16/21  2:00 PM   Specimen: Nasopharyngeal Swab; Nasopharyngeal(NP) swabs in vial transport medium  Result Value Ref Range Status   SARS Coronavirus 2 by RT PCR NEGATIVE NEGATIVE Final    Comment: (NOTE) SARS-CoV-2 target nucleic acids are NOT DETECTED.  The SARS-CoV-2 RNA is generally detectable in upper respiratory specimens during the acute phase of infection. The lowest concentration of SARS-CoV-2 viral copies this assay can detect is 138 copies/mL. A negative result does not preclude SARS-Cov-2 infection and should not be used as the sole  basis for treatment or other patient management decisions. A negative result may occur with  improper specimen collection/handling, submission of specimen other than nasopharyngeal swab, presence of viral mutation(s) within the areas targeted by this assay, and inadequate number of viral copies(<138 copies/mL). A negative result must be combined with clinical observations, patient history, and epidemiological information. The expected result is Negative.  Fact Sheet for Patients:  EntrepreneurPulse.com.au  Fact Sheet for Healthcare Providers:  IncredibleEmployment.be  This test is no t yet approved or cleared by the [place] FDA and  has been authorized for detection and/or diagnosis of SARS-CoV-2 by FDA under an Emergency Use Authorization (EUA). This EUA will remain  in effect (meaning this test can be used) for the duration of the COVID-19 declaration under Section 564(b)(1) of the Act, 21 U.S.C.section 360bbb-3(b)(1), unless the authorization is terminated  or revoked sooner.       Influenza A by PCR NEGATIVE NEGATIVE Final   Influenza B by PCR NEGATIVE NEGATIVE Final    Comment: (NOTE) The Xpert Xpress SARS-CoV-2/FLU/RSV plus assay is intended as an aid in the diagnosis of influenza from Nasopharyngeal swab specimens and should not be used as a sole basis for treatment. Nasal washings and aspirates are unacceptable for Xpert Xpress SARS-CoV-2/FLU/RSV testing.  Fact Sheet for Patients: EntrepreneurPulse.com.au  Fact Sheet for Healthcare Providers: IncredibleEmployment.be  This test is not yet approved or cleared by the [place] FDA and has been authorized for detection and/or diagnosis of SARS-CoV-2 by FDA under an Emergency Use Authorization (EUA). This EUA will remain in effect (meaning this test can be used) for the duration of the COVID-19 declaration under Section 564(b)(1) of the Act,  21 U.S.C. section 360bbb-3(b)(1), unless the authorization is terminated or revoked.  Performed at [facility], [place] [address], [place], [place] [address]   Urine Culture     Status: Abnormal   Collection Time: 12/16/21  2:25 PM   Specimen: Urine, Clean Catch  Result Value Ref Range Status   Specimen Description URINE, CLEAN CATCH  Final   Special Requests   Final    NONE Performed at [facility], [place] [address], [place], [place] [address]    Culture (A)  Final    >=100,000 COLONIES/mL KLEBSIELLA PNEUMONIAE Confirmed Extended Spectrum Beta-Lactamase Producer (ESBL).  In bloodstream infections from ESBL organisms, carbapenems are preferred over piperacillin/tazobactam. They are shown to have a lower risk of mortality.  Report Status 12/18/2021 FINAL  Final   Organism ID, Bacteria KLEBSIELLA PNEUMONIAE (A)  Final      Susceptibility   Klebsiella pneumoniae - MIC*    AMPICILLIN >=32 RESISTANT Resistant     CEFAZOLIN >=64 RESISTANT Resistant     CEFEPIME >=32 RESISTANT Resistant     CEFTRIAXONE >=64 RESISTANT Resistant     CIPROFLOXACIN 2 RESISTANT Resistant     GENTAMICIN >=16 RESISTANT Resistant     IMIPENEM <=0.25 SENSITIVE Sensitive     NITROFURANTOIN 32 SENSITIVE Sensitive     TRIMETH/SULFA >=320 RESISTANT Resistant     AMPICILLIN/SULBACTAM >=32 RESISTANT Resistant     PIP/TAZO 8 SENSITIVE Sensitive     * >=100,000 COLONIES/mL KLEBSIELLA PNEUMONIAE  Blood culture (routine x 2)     Status: Abnormal (Preliminary result)   Collection Time: 12/16/21  2:30 PM   Specimen: BLOOD RIGHT ARM  Result Value Ref Range Status   Specimen Description BLOOD RIGHT ARM  Final   Special Requests   Final    BOTTLES DRAWN AEROBIC AND ANAEROBIC Blood Culture adequate volume   Culture  Setup Time   Final    IN BOTH AEROBIC AND ANAEROBIC BOTTLES GRAM NEGATIVE RODS CRITICAL RESULT CALLED TO, READ BACK BY AND VERIFIED WITH: PHARMD GREG ABBOTT 12/17/21@6 :35 BY TW    Culture (A)   Final    KLEBSIELLA PNEUMONIAE SUSCEPTIBILITIES TO FOLLOW Performed at [facility], [address] [place], [place] [address]    Report Status PENDING  Incomplete  Blood Culture ID Panel (Reflexed)     Status: Abnormal   Collection Time: 12/16/21  2:30 PM  Result Value Ref Range Status   Enterococcus faecalis NOT DETECTED NOT DETECTED Final   Enterococcus Faecium NOT DETECTED NOT DETECTED Final   Listeria monocytogenes NOT DETECTED NOT DETECTED Final   Staphylococcus species NOT DETECTED NOT DETECTED Final   Staphylococcus aureus (BCID) NOT DETECTED NOT DETECTED Final   Staphylococcus epidermidis NOT DETECTED NOT DETECTED Final   Staphylococcus lugdunensis NOT DETECTED NOT DETECTED Final   Streptococcus species NOT DETECTED NOT DETECTED Final   Streptococcus agalactiae NOT DETECTED NOT DETECTED Final   Streptococcus pneumoniae NOT DETECTED NOT DETECTED Final   Streptococcus pyogenes NOT DETECTED NOT DETECTED Final   A.calcoaceticus-baumannii NOT DETECTED NOT DETECTED Final   Bacteroides fragilis NOT DETECTED NOT DETECTED Final   Enterobacterales DETECTED (A) NOT DETECTED Final    Comment: Enterobacterales represent a large order of gram negative bacteria, not a single organism. CRITICAL RESULT CALLED TO, READ BACK BY AND VERIFIED WITH: PHARMD GREG ABBOTT 12/17/21@6 :35 BY TW    Enterobacter cloacae complex NOT DETECTED NOT DETECTED Final   Escherichia coli NOT DETECTED NOT DETECTED Final   Klebsiella aerogenes NOT DETECTED NOT DETECTED Final   Klebsiella oxytoca NOT DETECTED NOT DETECTED Final   Klebsiella pneumoniae DETECTED (A) NOT DETECTED Final    Comment: CRITICAL RESULT CALLED TO, READ BACK BY AND VERIFIED WITH: PHARMD GREG ABBOTT 12/17/21@6 :35 BY TW    Proteus species NOT DETECTED NOT DETECTED Final   Salmonella species NOT DETECTED NOT DETECTED Final   Serratia marcescens NOT DETECTED NOT DETECTED Final   Haemophilus influenzae NOT DETECTED NOT DETECTED Final    Neisseria meningitidis NOT DETECTED NOT DETECTED Final   Pseudomonas aeruginosa NOT DETECTED NOT DETECTED Final   Stenotrophomonas maltophilia NOT DETECTED NOT DETECTED Final   Candida albicans NOT DETECTED NOT DETECTED Final   Candida auris NOT DETECTED NOT DETECTED Final   Candida glabrata NOT DETECTED  NOT DETECTED Final   Candida krusei NOT DETECTED NOT DETECTED Final   Candida parapsilosis NOT DETECTED NOT DETECTED Final   Candida tropicalis NOT DETECTED NOT DETECTED Final   Cryptococcus neoformans/gattii NOT DETECTED NOT DETECTED Final   CTX-M ESBL DETECTED (A) NOT DETECTED Final    Comment: CRITICAL RESULT CALLED TO, READ BACK BY AND VERIFIED WITH: PHARMD GREG ABBOTT 12/17/21@6 :35 BY TW (NOTE) Extended spectrum beta-lactamase detected. Recommend a carbapenem as initial therapy.      Carbapenem resistance IMP NOT DETECTED NOT DETECTED Final   Carbapenem resistance KPC NOT DETECTED NOT DETECTED Final   Carbapenem resistance NDM NOT DETECTED NOT DETECTED Final   Carbapenem resist OXA 48 LIKE NOT DETECTED NOT DETECTED Final   Carbapenem resistance VIM NOT DETECTED NOT DETECTED Final    Comment: Performed at [facility], [place] [address] [place], [place] [address]  Blood culture (routine x 2)     Status: Abnormal (Preliminary result)   Collection Time: 12/16/21  2:40 PM   Specimen: BLOOD RIGHT ARM  Result Value Ref Range Status   Specimen Description BLOOD RIGHT ARM  Final   Special Requests   Final    BOTTLES DRAWN AEROBIC AND ANAEROBIC Blood Culture adequate volume   Culture  Setup Time   Final    GRAM NEGATIVE RODS IN BOTH AEROBIC AND ANAEROBIC BOTTLES CRITICAL VALUE NOTED.  VALUE IS CONSISTENT WITH PREVIOUSLY REPORTED AND CALLED VALUE. Performed at [facility], [place] [address] [place], [place] [address]    Culture KLEBSIELLA PNEUMONIAE (A)  Final   Report Status PENDING  Incomplete    RADIOLOGY STUDIES/RESULTS: No results found.   LOS: 2 days   Oren Binet, MD  [facility]    To contact the attending provider between 7A-7P or the covering provider during after hours 7P-7A, please log into the web site [email or web address] and access using universal [place] password for that web site. If you do not have the password, please call the hospital operator.  12/18/2021, 2:29 PM

## 2021-12-18 NOTE — TOC Initial Note (Signed)
Transition of Care [facility]) - Initial/Assessment Note    Patient Details  Name: Autumn Dean MRN: [id number] Date of Birth: January 07, 1933  Transition of Care [facility]) CM/SW Contact:    Benard Halsted, LCSW Phone Number: 12/18/2021, 4:04 PM  Clinical Narrative:                 CSW spoke with patient's nephew, Autumn Dean (POA). He stated family was hoping that patient could return to [facility] at discharge. CSW explained that normally ALF level of care cannot provide IV antibiotics at discharge as patient will likely require, but CSW left a voicemail with [facility]. [name] stated understanding and reported that patient has been to [facility] last year and requests to return there. CSW will continue to follow for insurance authorization.   Expected Discharge Plan: Skilled Nursing Facility Barriers to Discharge: Continued Medical Work up, Ship broker   Patient Goals and CMS Choice Patient states their goals for this hospitalization and ongoing recovery are:: Rehab CMS Medicare.gov Compare Post Acute Care list provided to:: Patient Represenative (must comment) Choice offered to / list presented to : [facility] POA / Guardian Counselling psychologist)  Expected Discharge Plan and Services Expected Discharge Plan: [place] In-house Referral: Clinical Social Work   Post Acute Care Choice: [place] Living arrangements for the past 2 months: [place]                                      Prior Living Arrangements/Services Living arrangements for the past 2 months: [place] Lives with:: Facility Resident Patient language and need for interpreter reviewed:: Yes Do you feel safe going back to the place where you live?: Yes      Need for Family Participation in Patient Care: Yes (Comment) Care giver support system in place?: Yes (comment) Current home services: DME Criminal Activity/Legal Involvement Pertinent to Current  Situation/Hospitalization: No - Comment as needed  Activities of Daily Living Home Assistive Devices/Equipment: Gilford Rile (specify type) ADL Screening (condition at time of admission) Patient's cognitive ability adequate to safely complete daily activities?: Yes Is the patient deaf or have difficulty hearing?: Yes Does the patient have difficulty seeing, even when wearing glasses/contacts?: Yes Does the patient have difficulty concentrating, remembering, or making decisions?: No Patient able to express need for assistance with ADLs?: Yes Does the patient have difficulty dressing or bathing?: Yes Independently performs ADLs?: Yes (appropriate for developmental age) Does the patient have difficulty walking or climbing stairs?: Yes Weakness of Legs: Both Weakness of Arms/Hands: Both  Permission Sought/Granted Permission sought to share information with : Facility Sport and exercise psychologist, Family Supports Permission granted to share information with : Yes, Verbal Permission Granted  Share Information with NAME: Autumn Dean  Permission granted to share info w AGENCY: SNFs  Permission granted to share info w Relationship: Nephew, POA  Permission granted to share info w Contact Information: [phone]  Emotional Assessment Appearance:: Appears stated age Attitude/Demeanor/Rapport: Gracious Affect (typically observed): Accepting, Appropriate Orientation: : Oriented to Self, Oriented to  Time, Oriented to Situation Alcohol / Substance Use: Not Applicable Psych Involvement: No (comment)  Admission diagnosis:  AKI (acute kidney injury) ([place]) [N17.9] Near syncope [R55] Hypotension [I95.9] Atrial fibrillation, unspecified type [facility]) [I48.91] Patient Active Problem List   Diagnosis Date Noted   Hypotension 12/16/2021   Urinary tract infection 12/16/2021   AKI (acute kidney injury) ([place]) 12/16/2021   Paroxysmal  atrial fibrillation ([place]) 12/16/2021   Generalized weakness 12/16/2021   Dementia  without behavioral disturbance ([place]) 12/16/2021   Chronic diastolic CHF (congestive heart failure) ([place]) 12/16/2021   Elevated troponin 12/16/2021   Chronic cystitis 06/29/2020   Left sided ulcerative colitis ([place]) 04/19/2019   Acute right-sided low back pain 04/16/2017   Tricuspid regurgitation 08/01/2014   Mitral regurgitation 07/08/2013   GERD (gastroesophageal reflux disease) 12/04/2011   UC (ulcerative colitis confined to rectum) ([place]) 12/04/2011   Hypertension 12/04/2011   PCP:  Glenda Chroman, MD Pharmacy:  No Pharmacies Listed    Social Determinants of Health (SDOH) Interventions    Readmission Risk Interventions No flowsheet data found.

## 2021-12-18 NOTE — NC FL2 (Signed)
[place] MEDICAID FL2 LEVEL OF CARE SCREENING TOOL     IDENTIFICATION  Patient Name: Autumn Dean Birthdate: June 03, 1933 Sex: female Admission Date (Current Location): 12/16/2021  [facility] and [place] Number:  Herbalist and Address:  The [place], [place] [address], [place], [place] [address]      Provider Number: [id number]  Attending Physician Name and Address:  Jonetta Osgood, MD  Relative Name and Phone Number:       Current Level of Care: Hospital Recommended Level of Care: [place] Prior Approval Number:    Date Approved/Denied:   PASRR Number: [id number]  Discharge Plan: SNF    Current Diagnoses: Patient Active Problem List   Diagnosis Date Noted   Hypotension 12/16/2021   Urinary tract infection 12/16/2021   AKI (acute kidney injury) ([place]) 12/16/2021   Paroxysmal atrial fibrillation ([place]) 12/16/2021   Generalized weakness 12/16/2021   Dementia without behavioral disturbance ([place]) 12/16/2021   Chronic diastolic CHF (congestive heart failure) ([place]) 12/16/2021   Elevated troponin 12/16/2021   Chronic cystitis 06/29/2020   Left sided ulcerative colitis ([place]) 04/19/2019   Acute right-sided low back pain 04/16/2017   Tricuspid regurgitation 08/01/2014   Mitral regurgitation 07/08/2013   GERD (gastroesophageal reflux disease) 12/04/2011   UC (ulcerative colitis confined to rectum) ([place]) 12/04/2011   Hypertension 12/04/2011    Orientation RESPIRATION BLADDER Height & Weight     Self, Situation, Time  Normal Incontinent Weight: 129 lb 3 oz (58.6 kg) Height:     BEHAVIORAL SYMPTOMS/MOOD NEUROLOGICAL BOWEL NUTRITION STATUS      Continent Diet (See dc summary)  AMBULATORY STATUS COMMUNICATION OF NEEDS Skin   Limited Assist Verbally PU Stage and Appropriate Care (Stage I on buttocks)                       Personal Care Assistance Level of Assistance  Bathing, Feeding, Dressing Bathing Assistance:  Limited assistance Feeding assistance: Independent Dressing Assistance: Limited assistance     Functional Limitations Info  Hearing   Hearing Info: Impaired      SPECIAL CARE FACTORS FREQUENCY  PT (By licensed PT), OT (By licensed OT)     PT Frequency: 5x/week OT Frequency: 5x/week            Contractures Contractures Info: Not present    Additional Factors Info  Code Status, Allergies, Psychotropic Code Status Info: DNR Allergies Info: Macrobid (Nitrofurantoin Macrocrystal), Ciprofloxacin, Nitrofurantoin, Codeine, Demerol, Latex Psychotropic Info: Buspar         Current Medications (12/18/2021):  This is the current hospital active medication list Current Facility-Administered Medications  Medication Dose Route Frequency Provider Last Rate Last Admin   0.9 %  sodium chloride infusion   Intravenous Continuous Fuller Plan A, MD 75 mL/hr at 12/18/21 0524 New Bag at 12/18/21 0524   acetaminophen (TYLENOL) tablet 650 mg  650 mg Oral Q6H PRN Norval Morton, MD       Or   acetaminophen (TYLENOL) suppository 650 mg  650 mg Rectal Q6H PRN Fuller Plan A, MD       albuterol (PROVENTIL) (2.5 MG/3ML) 0.083% nebulizer solution 2.5 mg  2.5 mg Nebulization Q6H PRN Fuller Plan A, MD       apixaban (ELIQUIS) tablet 2.5 mg  2.5 mg Oral BID Tamala Julian, Rondell A, MD   2.5 mg at 12/18/21 0821   busPIRone (BUSPAR) tablet 5 mg  5 mg Oral BID Norval Morton, MD  5 mg at 12/18/21 1552   carvedilol (COREG) tablet 3.125 mg  3.125 mg Oral BID Fuller Plan A, MD   3.125 mg at 12/18/21 0802   clotrimazole (GYNE-LOTRIMIN 3) 2 % vaginal cream 1 Applicatorful  1 Applicatorful Vaginal QHS Fuller Plan A, MD   1 Applicatorful at 23/36/12 2133   fluticasone (FLONASE) 50 MCG/ACT nasal spray 1 spray  1 spray Each Nare Daily PRN Norval Morton, MD       Gerhardt's butt cream 1 application  1 application Topical TID PRN Tamala Julian, Rondell A, MD       lidocaine (LIDODERM) 5 % 1 patch  1 patch  Transdermal q morning Smith, Rondell A, MD       liver oil-zinc oxide (DESITIN) 40 % ointment 1 application  1 application Topical TID PRN Fuller Plan A, MD       magnesium hydroxide (MILK OF MAGNESIA) suspension 30 mL  30 mL Oral QHS PRN Smith, Rondell A, MD       meropenem (MERREM) 1 g in sodium chloride 0.9 % 100 mL IVPB  1 g Intravenous Q12H Elgergawy, Silver Huguenin, MD 200 mL/hr at 12/18/21 0823 1 g at 12/18/21 0823   pantoprazole (PROTONIX) EC tablet 40 mg  40 mg Oral Daily Smith, Rondell A, MD   40 mg at 12/18/21 2449   senna-docusate (Senokot-S) tablet 1 tablet  1 tablet Oral Q M,W,F-1800 Smith, Rondell A, MD       sodium chloride flush (NS) 0.9 % injection 3 mL  3 mL Intravenous Q12H Smith, Rondell A, MD   3 mL at 12/18/21 0826   sulfaSALAzine (AZULFIDINE) tablet 500 mg  500 mg Oral TID Norval Morton, MD   500 mg at 12/18/21 7530     Discharge Medications: Please see discharge summary for a list of discharge medications.  Relevant Imaging Results:  Relevant Lab Results:   Additional Information SSN: [id number]. [place] COVID-19 Vaccine 08/23/2020 , 01/18/2020 , 12/25/2019  Benard Halsted, LCSW

## 2021-12-18 NOTE — Progress Notes (Signed)
Physical Therapy Treatment Patient Details Name: Autumn Dean MRN: [id number] DOB: 1933/07/17 Today's Date: 12/18/2021   History of Present Illness 88 y.o. female with medical history significant of dementia, hypertension, ulcerative colitis, and GERD who presents 12/16/21 with complaints of not feeling well and weak. +hypotension, afib, AKI, CHF    PT Comments    Patient able to ambulate with RW today with min assist and moderate cues for safe, proper use. Patient again reports she does not usually use a device to walk (?accuracy, although does need mod cues to use RW). Offered to try ambulation without device and pt reports she feels too weak. Discussed discharge plan and pt/PT agree that she needs time in rehab prior to return to ALF. Discharge plan updated.     Recommendations for follow up therapy are one component of a multi-disciplinary discharge planning process, led by the attending physician.  Recommendations may be updated based on patient status, additional functional criteria and insurance authorization.  Follow Up Recommendations  Skilled nursing-short term rehab (<3 hours/day)     Assistance Recommended at Discharge Intermittent Supervision/Assistance  Patient can return home with the following A little help with walking and/or transfers;A little help with bathing/dressing/bathroom;Assistance with cooking/housework;Direct supervision/assist for medications management;Direct supervision/assist for financial management;Help with stairs or ramp for entrance   Equipment Recommendations  Rolling walker (2 wheels)    Recommendations for Other Services       Precautions / Restrictions Precautions Precautions: Fall Restrictions Weight Bearing Restrictions: No     Mobility  Bed Mobility Overal bed mobility: Modified Independent             General bed mobility comments: supine to sit with slight incr time, HOB flat, no rail    Transfers Overall transfer level:  Needs assistance Equipment used: Rolling walker (2 wheels) Transfers: Sit to/from Stand Sit to Stand: Min assist           General transfer comment: from EOB, slight boost to initiate    Ambulation/Gait Ambulation/Gait assistance: Min assist Gait Distance (Feet): 45 Feet Assistive device: Rolling walker (2 wheels) Gait Pattern/deviations: Step-through pattern, Decreased stride length, Trunk flexed   Gait velocity interpretation: <1.8 ft/sec, indicate of risk for recurrent falls   General Gait Details: pt required assist steering RW (states she did not use a device PTA, but feels too weak to walk without one now); vc for upright posture   Stairs             Wheelchair Mobility    Modified Rankin (Stroke Patients Only)       Balance Overall balance assessment: Mild deficits observed, not formally tested                                          Cognition Arousal/Alertness: Awake/alert Behavior During Therapy: WFL for tasks assessed/performed Overall Cognitive Status: No family/caregiver present to determine baseline cognitive functioning                                          Exercises      General Comments General comments (skin integrity, edema, etc.): on room air; denied dizziness      Pertinent Vitals/Pain Pain Assessment Pain Assessment: No/denies pain    Home Living  Prior Function            PT Goals (current goals can now be found in the care plan section) Acute Rehab PT Goals Patient Stated Goal: agrees wants to get stronger and walk PT Goal Formulation: With patient Time For Goal Achievement: 12/31/21 Potential to Achieve Goals: Good Progress towards PT goals: Progressing toward goals    Frequency    Min 3X/week      PT Plan Discharge plan needs to be updated    Co-evaluation              AM-PAC PT "6 Clicks" Mobility   Outcome Measure  Help  needed turning from your back to your side while in a flat bed without using bedrails?: A Little Help needed moving from lying on your back to sitting on the side of a flat bed without using bedrails?: A Little Help needed moving to and from a bed to a chair (including a wheelchair)?: A Little Help needed standing up from a chair using your arms (e.g., wheelchair or bedside chair)?: A Little Help needed to walk in hospital room?: Total Help needed climbing 3-5 steps with a railing? : Total 6 Click Score: 14    End of Session Equipment Utilized During Treatment: Gait belt Activity Tolerance: Patient tolerated treatment well Patient left: in chair;with call bell/phone within reach;with chair alarm set Nurse Communication: Mobility status PT Visit Diagnosis: Unsteadiness on feet (R26.81);Muscle weakness (generalized) (M62.81)     Time: 5947-0761 PT Time Calculation (min) (ACUTE ONLY): 19 min  Charges:  $Gait Training: 8-22 mins                      Arby Barrette, PT [place]  Pager [phone] Office [phone]    Rexanne Mano 12/18/2021, 9:29 AM

## 2021-12-19 LAB — CULTURE, BLOOD (ROUTINE X 2)
Special Requests: ADEQUATE
Special Requests: ADEQUATE

## 2021-12-19 NOTE — Evaluation (Signed)
Occupational Therapy Evaluation Patient Details Name: Autumn Dean MRN: [id number] DOB: February 10, 1933 Today's Date: 12/19/2021   History of Present Illness 88 y.o. female with medical history significant of dementia, hypertension, ulcerative colitis, and GERD who presents 12/16/21 with complaints of not feeling well and weak. +hypotension, afib, AKI, CHF   Clinical Impression   Patient is currently requiring assistance with ADLs including up to moderate assist with Lower body ADLs,  Min guard to supervision/setup assist with Upper body ADLs,  as well as Min assist with functional transfers to toilet.  Current level of function may be below patient's typical baseline as pt reports ambulation without an AD and performing own ADLs, however pt with dementia and memory deficits without family available.  During this evaluation, patient was limited by confusion, generalized weakness, impaired activity tolerance, and tachycardia with light activity, all of which has the potential to impact patient's safety and independence during functional mobility, as well as performance for ADLs.  Patient lives at an ALF per chart review. Pt unable to give any information re: home and reports that she still lives alone at home.  Patient demonstrates good rehab potential, and should benefit from continued skilled occupational therapy services while in acute care to maximize safety, independence and quality of life at home.  Continued occupational therapy services in pt's own ALF is recommended to allow pt to be in a familiar environment as long as 24/7 supervision is available.  ?     Recommendations for follow up therapy are one component of a multi-disciplinary discharge planning process, led by the attending physician.  Recommendations may be updated based on patient status, additional functional criteria and insurance authorization.   Follow Up Recommendations  Home health OT (At [place].)     Assistance Recommended at Discharge Frequent or constant Supervision/Assistance  Patient can return home with the following A little help with walking and/or transfers;A little help with bathing/dressing/bathroom;Direct supervision/assist for medications management;Direct supervision/assist for financial management    Functional Status Assessment  Patient has had a recent decline in their functional status and demonstrates the ability to make significant improvements in function in a reasonable and predictable amount of time.  Equipment Recommendations  Other (comment) (Per Facility)    Recommendations for Other Services       Precautions / Restrictions Precautions Precautions: Fall Precaution Comments: dementia, Contact Restrictions Weight Bearing Restrictions: No      Mobility Bed Mobility               General bed mobility comments: Pt up in recliner for OT Eval.    Transfers                          Balance Overall balance assessment: Needs assistance   Sitting balance-Leahy Scale: Good     Standing balance support: During functional activity, Reliant on assistive device for balance Standing balance-Leahy Scale: Poor                             ADL either performed or assessed with clinical judgement   ADL Overall ADL's : Needs assistance/impaired Eating/Feeding: Set up;Supervision/ safety;Sitting   Grooming: Set up;Wash/dry hands;Sitting Grooming Details (indicate cue type and reason): Pt on toilet and handed washcloth. Deferred standing at sink due to tachycardia up to 144 while in bathroom. Upper Body Bathing: Minimal assistance;Sitting   Lower Body Bathing: Moderate assistance;Sitting/lateral leans;Sit to/from stand  Lower Body Bathing Details (indicate cue type and reason): May be Min assist but scoring as Mod assist due to poor activity tolerance/tachycarida. Upper Body Dressing : Minimal assistance;Sitting   Lower Body  Dressing: Moderate assistance;Sit to/from stand;Sitting/lateral leans   Toilet Transfer: Minimal Teacher, English as a foreign language;Ambulation;Grab bars;Cueing for sequencing;Cueing for safety Toilet Transfer Details (indicate cue type and reason): Unsafe ambulating with RW. Difficulty problem solving room obstacles with RW and becomes stuck. Required increased assist with RW as needed to manuever. Very stooped. Toileting- Clothing Manipulation and Hygiene: Moderate assistance;Sitting/lateral lean;Sit to/from stand;Min guard Toileting - Clothing Manipulation Details (indicate cue type and reason): Pt able to perform about 50% of hygiene with Min guard of trunk for steadying during leans. Pt then stood for OT to complete posterior hygiene.     Functional mobility during ADLs: Minimal assistance;Rolling walker (2 wheels);Cueing for sequencing;Cueing for safety       Vision   Additional Comments: May need further assessment as pt difficulty manuevering around obstacles in room.     Perception     Praxis      Pertinent Vitals/Pain Pain Assessment Pain Assessment: Faces Pain Score: 0-No pain Faces Pain Scale: No hurt     Hand Dominance     Extremity/Trunk Assessment Upper Extremity Assessment Upper Extremity Assessment: Generalized weakness   Lower Extremity Assessment Lower Extremity Assessment: Generalized weakness   Cervical / Trunk Assessment Cervical / Trunk Assessment: Kyphotic   Communication Communication Communication: HOH   Cognition Arousal/Alertness: Awake/alert Behavior During Therapy: WFL for tasks assessed/performed Overall Cognitive Status: No family/caregiver present to determine baseline cognitive functioning                                 General Comments: oriented to self and general situation.   Decreased memory and labile/tearful at times stating that she does not think that she can go home.     General Comments       Exercises      Shoulder Instructions      Home Living Family/patient expects to be discharged to:: Assisted living                             Home Equipment: Kasandra Knudsen - single point          Prior Functioning/Environment Prior Level of Function : Patient poor historian/Family not available             Mobility Comments: pt states she walks without a device (?accuracy). Pt reprots that she lives at home and likes to garden. No family in room. ADLs Comments: Unknown        OT Problem List: Decreased strength;Decreased coordination;Cardiopulmonary status limiting activity;Decreased cognition;Decreased safety awareness;Decreased activity tolerance;Impaired balance (sitting and/or standing);Decreased knowledge of use of DME or AE      OT Treatment/Interventions: Self-care/ADL training;Therapeutic exercise;Therapeutic activities;Cognitive remediation/compensation;Energy conservation;Patient/family education;DME and/or AE instruction;Balance training    OT Goals(Current goals can be found in the care plan section) Acute Rehab OT Goals Patient Stated Goal: "I think I'm going to need help when I leave here." OT Goal Formulation: With patient Time For Goal Achievement: 01/02/22 ADL Goals Pt Will Perform Grooming: standing;with supervision (with vitals stable) Pt Will Perform Lower Body Bathing: with min guard assist;sitting/lateral leans;sit to/from stand Pt Will Perform Lower Body Dressing: with set-up;with supervision;sitting/lateral leans;sit to/from stand Pt Will Transfer to Toilet: with supervision;ambulating Pt Will Perform  Toileting - Clothing Manipulation and hygiene: with supervision;sit to/from stand;sitting/lateral leans Additional ADL Goal #1: Patient will tolerate BUE home exercise program 10-15 reps within pain-free ranges with vitals stable, in an unsupported seated position, in order to improve upper body strength, endurance and core stability needed to complete home ADLs.   OT Frequency: Min 2X/week    Co-evaluation              AM-PAC OT "6 Clicks" Daily Activity     Outcome Measure Help from another person eating meals?: A Little Help from another person taking care of personal grooming?: A Little Help from another person toileting, which includes using toliet, bedpan, or urinal?: A Lot Help from another person bathing (including washing, rinsing, drying)?: A Lot Help from another person to put on and taking off regular upper body clothing?: A Little Help from another person to put on and taking off regular lower body clothing?: A Lot 6 Click Score: 15   End of Session Equipment Utilized During Treatment: Gait belt;Rolling walker (2 wheels) Nurse Communication: Other (comment) (RN approved OT visit. CNAs in room handed off to OT)  Activity Tolerance: Treatment limited secondary to medical complications (Comment) (Limited by HR) Patient left: in chair;with call bell/phone within reach;with chair alarm set  OT Visit Diagnosis: Unsteadiness on feet (R26.81);Muscle weakness (generalized) (M62.81);Other symptoms and signs involving cognitive function                Time: 4403-4742 OT Time Calculation (min): 29 min Charges:  OT General Charges $OT Visit: 1 Visit OT Evaluation $OT Eval Low Complexity: 1 Low OT Treatments $Self Care/Home Management : 8-22 mins  Anderson Malta, OT Acute Rehab Services Office: [phone] 12/19/2021  Julien Girt 12/19/2021, 10:49 AM

## 2021-12-19 NOTE — TOC Progression Note (Addendum)
Transition of Care [facility]) - Progression Note    Patient Details  Name: Autumn Dean MRN: [id number] Date of Birth: [birth year]  [facility]) CM/SW [place], LCSW Phone Number: 12/19/2021, 4:33 PM  Clinical Narrative:    [facility] able to accept patient on IV Invanz q24 at discharge. Patient will need PICC prior to discharge. CSW updated patient's POA, [name], and submitted clinicals to [place] for review, Ref# [id number]. Requested COVID test.    Expected Discharge Plan: [place] Barriers to Discharge: Continued Medical Work up, Ship broker  Expected Discharge Plan and Services Expected Discharge Plan: [place] In-house Referral: Clinical Social Work   Post Acute Care Choice: [place] Living arrangements for the past 2 months: [place]                                       Social Determinants of Health (SDOH) Interventions    Readmission Risk Interventions No flowsheet data found.

## 2021-12-19 NOTE — Progress Notes (Signed)
PROGRESS NOTE        PATIENT DETAILS Name: Autumn Dean Age: 88 y.o. Sex: female Date of Birth: 20-Apr-1933 Admit Date: 12/16/2021 Admitting Physician Norval Morton, MD MMN:[id number], Costella Hatcher, MD  Brief Summary: Patient is a 88 y.o.  female with history of dementia, HTN, ulcerative colitis, 1 recently was treated for UTI with Bactrim-presented to the hospital with weakness-patient was subsequently found to have ESBL Klebsiella pneumonia bacteremia.  See below for further details.  Significant Hospital events: 2/13>> admit to [facility] for weakness/hypotension-found to have ESBL Klebsiella bacteremia  Significant imaging studies: 2/13>> CXR: No PNA  Significant microbiology data: 2/13>> COVID/influenza PCR: Negative 2/13>> urine culture: ESBL Klebsiella pneumoniae 2/13>> blood culture: ESBL Klebsiella pneumoniae 2/16>> blood culture: Pending  Procedures: None  Consults:  None  Subjective: Lying comfortably in bed-no chest pain or shortness of breath.  Objective: Vitals: Blood pressure (!) 154/89, pulse (!) 144, temperature (!) 97.4 F (36.3 C), temperature source Oral, resp. rate 20, height 5' 3"  (1.6 m), weight 59.2 kg, SpO2 94 %.   Exam: Gen Exam:not in any distress HEENT:atraumatic, normocephalic Chest: B/L clear to auscultation anteriorly CVS:S1S2 regular Abdomen:soft non tender, non distended Extremities:no edema Neurology: Non focal Skin: no rash   Pertinent Labs/Radiology: CBC Latest Ref Rng & Units 12/18/2021 12/17/2021 12/16/2021  WBC 4.0 - 10.5 K/uL 6.6 12.2(H) 15.5(H)  Hemoglobin 12.0 - 15.0 g/dL 11.0(L) 12.1 12.5  Hematocrit 36.0 - 46.0 % 34.3(L) 38.1 38.6  Platelets 150 - 400 K/uL 152 157 179    Lab Results  Component Value Date   NA 131 (L) 12/18/2021   K 4.6 12/18/2021   CL 101 12/18/2021   CO2 21 (L) 12/18/2021     Assessment/Plan: Sepsis due to ESBL Klebsiella bacteremia and complicated UTI: Sepsis physiology  has resolved-continue IV meropenem-we will plan on at least a 7-day course.  Repeat cultures today.   AKI: Likely hemodynamically mediated in the setting of sepsis-resolved with supportive care.  Chronic HFpEF: Euvolemic-continue to watch electrolytes closely.  PAF: Maintaining sinus rhythm-on Coreg-remains on Eliquis.  Minimally elevated troponin: Of no clinical significance.  No chest pain/anginal symptoms.  Hyponatremia: Mild-appears to be chronic-stable for monitoring periodically.  History of ulcerative colitis: No flare-continue sulfasalazine  GERD: Continue PPI  Dementia without behavioral disturbance: Appears to be mild-able to give history/answer questions appropriately.  Maintain delirium precautions.  Debility/deconditioning: Due to acute illness-evaluated by PT/OT with recommendations for SNF.  BMI: Estimated body mass index is 23.12 kg/m as calculated from the following:   Height as of this encounter: 5' 3"  (1.6 m).   Weight as of this encounter: 59.2 kg.   Pressure Injury Pressure Injury 12/16/21 Buttocks Right;Left Stage 1 -  Intact skin with non-blanchable redness of a localized area usually over a bony prominence. Bruising (Active)  12/16/21 2319  Location: Buttocks  Location Orientation: Right;Left  Staging: Stage 1 -  Intact skin with non-blanchable redness of a localized area usually over a bony prominence.  Wound Description (Comments): Bruising  Present on Admission: Yes     Code status:   Code Status: DNR   DVT Prophylaxis: apixaban (ELIQUIS) tablet 2.5 mg Start: 12/16/21 2200 apixaban (ELIQUIS) tablet 2.5 mg     Family Communication: Brother-John Bovard-[phone] over the phone on 2/15.  Disposition Plan: Status is: Inpatient Remains inpatient appropriate because: Resolving sepsis physiology from  ESBL Klebsiella bacteremia.  Not yet stable for discharge.   Planned Discharge Destination:Skilled nursing facility   Diet: Diet Order              Diet Heart Room service appropriate? Yes; Fluid consistency: Thin  Diet effective now                     Antimicrobial agents: Anti-infectives (From admission, onward)    Start     Dose/Rate Route Frequency Ordered Stop   12/17/21 0800  meropenem (MERREM) 1 g in sodium chloride 0.9 % 100 mL IVPB        1 g 200 mL/hr over 30 Minutes Intravenous Every 12 hours 12/17/21 0643     12/16/21 1745  fluconazole (DIFLUCAN) tablet 100 mg        100 mg Oral  Once 12/16/21 1742 12/16/21 1833        MEDICATIONS: Scheduled Meds:  apixaban  2.5 mg Oral BID   busPIRone  5 mg Oral BID   carvedilol  3.125 mg Oral BID   lidocaine  1 patch Transdermal q morning   pantoprazole  40 mg Oral Daily   senna-docusate  1 tablet Oral Q M,W,F-1800   sodium chloride flush  3 mL Intravenous Q12H   sulfaSALAzine  500 mg Oral TID   Continuous Infusions:  meropenem (MERREM) IV 1 g (12/18/21 2106)   PRN Meds:.acetaminophen **OR** acetaminophen, albuterol, fluticasone, Gerhardt's butt cream, liver oil-zinc oxide, magnesium hydroxide   I have personally reviewed following labs and imaging studies  LABORATORY DATA: CBC: Recent Labs  Lab 12/16/21 1353 12/17/21 0152 12/18/21 0125  WBC 15.5* 12.2* 6.6  NEUTROABS 14.5*  --   --   HGB 12.5 12.1 11.0*  HCT 38.6 38.1 34.3*  MCV 96.5 96.9 94.8  PLT 179 157 152     Basic Metabolic Panel: Recent Labs  Lab 12/16/21 1353 12/17/21 0152 12/18/21 0125  NA 129* 130* 131*  K 4.4 4.5 4.6  CL 98 100 101  CO2 21* 18* 21*  GLUCOSE 97 91 121*  BUN 31* 32* 21  CREATININE 2.33* 1.66* 0.92  CALCIUM 9.4 9.6 9.5  MG 2.2 2.1  --      GFR: Estimated Creatinine Clearance: 36.3 mL/min (by C-G formula based on SCr of 0.92 mg/dL).  Liver Function Tests: Recent Labs  Lab 12/16/21 1353  AST 19  ALT 11  ALKPHOS 75  BILITOT 0.7  PROT 6.0*  ALBUMIN 3.1*    No results for input(s): LIPASE, AMYLASE in the last 168 hours. Recent Labs  Lab  12/16/21 1354  AMMONIA 30     Coagulation Profile: Recent Labs  Lab 12/16/21 1353  INR 1.1     Cardiac Enzymes: No results for input(s): CKTOTAL, CKMB, CKMBINDEX, TROPONINI in the last 168 hours.  BNP (last 3 results) No results for input(s): PROBNP in the last 8760 hours.  Lipid Profile: No results for input(s): CHOL, HDL, LDLCALC, TRIG, CHOLHDL, LDLDIRECT in the last 72 hours.  Thyroid Function Tests: Recent Labs    12/16/21 1354  TSH 1.876     Anemia Panel: No results for input(s): VITAMINB12, FOLATE, FERRITIN, TIBC, IRON, RETICCTPCT in the last 72 hours.  Urine analysis:    Component Value Date/Time   COLORURINE AMBER (A) 12/17/2021 1135   APPEARANCEUR CLOUDY (A) 12/17/2021 1135   APPEARANCEUR Clear 10/05/2020 1330   LABSPEC 1.023 12/17/2021 1135   PHURINE 5.0 12/17/2021 1135   GLUCOSEU NEGATIVE 12/17/2021 1135  HGBUR SMALL (A) 12/17/2021 1135   BILIRUBINUR NEGATIVE 12/17/2021 1135   BILIRUBINUR Negative 10/05/2020 1330   KETONESUR 20 (A) 12/17/2021 1135   PROTEINUR 100 (A) 12/17/2021 1135   UROBILINOGEN 0.2 05/08/2015 1505   NITRITE NEGATIVE 12/17/2021 1135   LEUKOCYTESUR LARGE (A) 12/17/2021 1135    Sepsis Labs: Lactic Acid, Venous No results found for: LATICACIDVEN  MICROBIOLOGY: Recent Results (from the past 240 hour(s))  Resp Panel by RT-PCR (Flu A&B, Covid) Nasopharyngeal Swab     Status: None   Collection Time: 12/16/21  2:00 PM   Specimen: Nasopharyngeal Swab; Nasopharyngeal(NP) swabs in vial transport medium  Result Value Ref Range Status   SARS Coronavirus 2 by RT PCR NEGATIVE NEGATIVE Final    Comment: (NOTE) SARS-CoV-2 target nucleic acids are NOT DETECTED.  The SARS-CoV-2 RNA is generally detectable in upper respiratory specimens during the acute phase of infection. The lowest concentration of SARS-CoV-2 viral copies this assay can detect is 138 copies/mL. A negative result does not preclude SARS-Cov-2 infection and should not  be used as the sole basis for treatment or other patient management decisions. A negative result may occur with  improper specimen collection/handling, submission of specimen other than nasopharyngeal swab, presence of viral mutation(s) within the areas targeted by this assay, and inadequate number of viral copies(<138 copies/mL). A negative result must be combined with clinical observations, patient history, and epidemiological information. The expected result is Negative.  Fact Sheet for Patients:  EntrepreneurPulse.com.au  Fact Sheet for Healthcare Providers:  IncredibleEmployment.be  This test is no t yet approved or cleared by the [place] FDA and  has been authorized for detection and/or diagnosis of SARS-CoV-2 by FDA under an Emergency Use Authorization (EUA). This EUA will remain  in effect (meaning this test can be used) for the duration of the COVID-19 declaration under Section 564(b)(1) of the Act, 21 U.S.C.section 360bbb-3(b)(1), unless the authorization is terminated  or revoked sooner.       Influenza A by PCR NEGATIVE NEGATIVE Final   Influenza B by PCR NEGATIVE NEGATIVE Final    Comment: (NOTE) The Xpert Xpress SARS-CoV-2/FLU/RSV plus assay is intended as an aid in the diagnosis of influenza from Nasopharyngeal swab specimens and should not be used as a sole basis for treatment. Nasal washings and aspirates are unacceptable for Xpert Xpress SARS-CoV-2/FLU/RSV testing.  Fact Sheet for Patients: EntrepreneurPulse.com.au  Fact Sheet for Healthcare Providers: IncredibleEmployment.be  This test is not yet approved or cleared by the [place] FDA and has been authorized for detection and/or diagnosis of SARS-CoV-2 by FDA under an Emergency Use Authorization (EUA). This EUA will remain in effect (meaning this test can be used) for the duration of the COVID-19 declaration under Section  564(b)(1) of the Act, 21 U.S.C. section 360bbb-3(b)(1), unless the authorization is terminated or revoked.  Performed at [facility], [place] [address], [place], [place] [address]   Urine Culture     Status: Abnormal   Collection Time: 12/16/21  2:25 PM   Specimen: Urine, Clean Catch  Result Value Ref Range Status   Specimen Description URINE, CLEAN CATCH  Final   Special Requests   Final    NONE Performed at [facility], [place] [address] [place], [place] [address]    Culture (A)  Final    >=100,000 COLONIES/mL KLEBSIELLA PNEUMONIAE Confirmed Extended Spectrum Beta-Lactamase Producer (ESBL).  In bloodstream infections from ESBL organisms, carbapenems are preferred over piperacillin/tazobactam. They are shown to have a lower  risk of mortality.    Report Status 12/18/2021 FINAL  Final   Organism ID, Bacteria KLEBSIELLA PNEUMONIAE (A)  Final      Susceptibility   Klebsiella pneumoniae - MIC*    AMPICILLIN >=32 RESISTANT Resistant     CEFAZOLIN >=64 RESISTANT Resistant     CEFEPIME >=32 RESISTANT Resistant     CEFTRIAXONE >=64 RESISTANT Resistant     CIPROFLOXACIN 2 RESISTANT Resistant     GENTAMICIN >=16 RESISTANT Resistant     IMIPENEM <=0.25 SENSITIVE Sensitive     NITROFURANTOIN 32 SENSITIVE Sensitive     TRIMETH/SULFA >=320 RESISTANT Resistant     AMPICILLIN/SULBACTAM >=32 RESISTANT Resistant     PIP/TAZO 8 SENSITIVE Sensitive     * >=100,000 COLONIES/mL KLEBSIELLA PNEUMONIAE  Blood culture (routine x 2)     Status: Abnormal   Collection Time: 12/16/21  2:30 PM   Specimen: BLOOD RIGHT ARM  Result Value Ref Range Status   Specimen Description BLOOD RIGHT ARM  Final   Special Requests   Final    BOTTLES DRAWN AEROBIC AND ANAEROBIC Blood Culture adequate volume   Culture  Setup Time   Final    IN BOTH AEROBIC AND ANAEROBIC BOTTLES GRAM NEGATIVE RODS CRITICAL RESULT CALLED TO, READ BACK BY AND VERIFIED WITH: PHARMD GREG ABBOTT 12/17/21@6 :35 BY TW Performed at  [facility], [place] [address] [place], [place] [address]    Culture (A)  Final    KLEBSIELLA PNEUMONIAE Confirmed Extended Spectrum Beta-Lactamase Producer (ESBL).  In bloodstream infections from ESBL organisms, carbapenems are preferred over piperacillin/tazobactam. They are shown to have a lower risk of mortality.    Report Status 12/19/2021 FINAL  Final   Organism ID, Bacteria KLEBSIELLA PNEUMONIAE  Final      Susceptibility   Klebsiella pneumoniae - MIC*    AMPICILLIN >=32 RESISTANT Resistant     CEFAZOLIN >=64 RESISTANT Resistant     CEFEPIME >=32 RESISTANT Resistant     CEFTAZIDIME RESISTANT Resistant     CEFTRIAXONE >=64 RESISTANT Resistant     CIPROFLOXACIN 1 RESISTANT Resistant     GENTAMICIN >=16 RESISTANT Resistant     IMIPENEM <=0.25 SENSITIVE Sensitive     TRIMETH/SULFA >=320 RESISTANT Resistant     AMPICILLIN/SULBACTAM >=32 RESISTANT Resistant     PIP/TAZO 8 SENSITIVE Sensitive     * KLEBSIELLA PNEUMONIAE  Blood Culture ID Panel (Reflexed)     Status: Abnormal   Collection Time: 12/16/21  2:30 PM  Result Value Ref Range Status   Enterococcus faecalis NOT DETECTED NOT DETECTED Final   Enterococcus Faecium NOT DETECTED NOT DETECTED Final   Listeria monocytogenes NOT DETECTED NOT DETECTED Final   Staphylococcus species NOT DETECTED NOT DETECTED Final   Staphylococcus aureus (BCID) NOT DETECTED NOT DETECTED Final   Staphylococcus epidermidis NOT DETECTED NOT DETECTED Final   Staphylococcus lugdunensis NOT DETECTED NOT DETECTED Final   Streptococcus species NOT DETECTED NOT DETECTED Final   Streptococcus agalactiae NOT DETECTED NOT DETECTED Final   Streptococcus pneumoniae NOT DETECTED NOT DETECTED Final   Streptococcus pyogenes NOT DETECTED NOT DETECTED Final   A.calcoaceticus-baumannii NOT DETECTED NOT DETECTED Final   Bacteroides fragilis NOT DETECTED NOT DETECTED Final   Enterobacterales DETECTED (A) NOT DETECTED Final    Comment: Enterobacterales represent a  large order of gram negative bacteria, not a single organism. CRITICAL RESULT CALLED TO, READ BACK BY AND VERIFIED WITH: PHARMD GREG ABBOTT 12/17/21@6 :35 BY TW    Enterobacter cloacae complex NOT DETECTED NOT DETECTED Final  Escherichia coli NOT DETECTED NOT DETECTED Final   Klebsiella aerogenes NOT DETECTED NOT DETECTED Final   Klebsiella oxytoca NOT DETECTED NOT DETECTED Final   Klebsiella pneumoniae DETECTED (A) NOT DETECTED Final    Comment: CRITICAL RESULT CALLED TO, READ BACK BY AND VERIFIED WITH: PHARMD GREG ABBOTT 12/17/21@6 :35 BY TW    Proteus species NOT DETECTED NOT DETECTED Final   Salmonella species NOT DETECTED NOT DETECTED Final   Serratia marcescens NOT DETECTED NOT DETECTED Final   Haemophilus influenzae NOT DETECTED NOT DETECTED Final   Neisseria meningitidis NOT DETECTED NOT DETECTED Final   Pseudomonas aeruginosa NOT DETECTED NOT DETECTED Final   Stenotrophomonas maltophilia NOT DETECTED NOT DETECTED Final   Candida albicans NOT DETECTED NOT DETECTED Final   Candida auris NOT DETECTED NOT DETECTED Final   Candida glabrata NOT DETECTED NOT DETECTED Final   Candida krusei NOT DETECTED NOT DETECTED Final   Candida parapsilosis NOT DETECTED NOT DETECTED Final   Candida tropicalis NOT DETECTED NOT DETECTED Final   Cryptococcus neoformans/gattii NOT DETECTED NOT DETECTED Final   CTX-M ESBL DETECTED (A) NOT DETECTED Final    Comment: CRITICAL RESULT CALLED TO, READ BACK BY AND VERIFIED WITH: PHARMD GREG ABBOTT 12/17/21@6 :35 BY TW (NOTE) Extended spectrum beta-lactamase detected. Recommend a carbapenem as initial therapy.      Carbapenem resistance IMP NOT DETECTED NOT DETECTED Final   Carbapenem resistance KPC NOT DETECTED NOT DETECTED Final   Carbapenem resistance NDM NOT DETECTED NOT DETECTED Final   Carbapenem resist OXA 48 LIKE NOT DETECTED NOT DETECTED Final   Carbapenem resistance VIM NOT DETECTED NOT DETECTED Final    Comment: Performed at [facility], [place] [address] [place], [place] [address]  Blood culture (routine x 2)     Status: Abnormal   Collection Time: 12/16/21  2:40 PM   Specimen: BLOOD RIGHT ARM  Result Value Ref Range Status   Specimen Description BLOOD RIGHT ARM  Final   Special Requests   Final    BOTTLES DRAWN AEROBIC AND ANAEROBIC Blood Culture adequate volume   Culture  Setup Time   Final    GRAM NEGATIVE RODS IN BOTH AEROBIC AND ANAEROBIC BOTTLES CRITICAL VALUE NOTED.  VALUE IS CONSISTENT WITH PREVIOUSLY REPORTED AND CALLED VALUE.    Culture (A)  Final    KLEBSIELLA PNEUMONIAE SUSCEPTIBILITIES PERFORMED ON PREVIOUS CULTURE WITHIN THE LAST 5 DAYS. Performed at [facility], [place] [address], [place], [place] [address]    Report Status 12/19/2021 FINAL  Final    RADIOLOGY STUDIES/RESULTS: No results found.   LOS: 3 days   Oren Binet, MD  [facility]    To contact the attending provider between 7A-7P or the covering provider during after hours 7P-7A, please log into the web site [email or web address] and access using universal [place] password for that web site. If you do not have the password, please call the hospital operator.  12/19/2021, 10:50 AM

## 2021-12-20 ENCOUNTER — Inpatient Hospital Stay: Payer: Self-pay

## 2021-12-20 ENCOUNTER — Inpatient Hospital Stay (HOSPITAL_COMMUNITY): Payer: Medicare PPO

## 2021-12-20 DIAGNOSIS — R410 Disorientation, unspecified: Secondary | ICD-10-CM | POA: Diagnosis not present

## 2021-12-20 DIAGNOSIS — F419 Anxiety disorder, unspecified: Secondary | ICD-10-CM | POA: Diagnosis not present

## 2021-12-20 DIAGNOSIS — K5150 Left sided colitis without complications: Secondary | ICD-10-CM | POA: Diagnosis not present

## 2021-12-20 DIAGNOSIS — R000 Tachycardia, unspecified: Secondary | ICD-10-CM | POA: Diagnosis not present

## 2021-12-20 DIAGNOSIS — I4891 Unspecified atrial fibrillation: Secondary | ICD-10-CM | POA: Diagnosis not present

## 2021-12-20 DIAGNOSIS — B961 Klebsiella pneumoniae [K. pneumoniae] as the cause of diseases classified elsewhere: Secondary | ICD-10-CM | POA: Diagnosis not present

## 2021-12-20 DIAGNOSIS — F03918 Unspecified dementia, unspecified severity, with other behavioral disturbance: Secondary | ICD-10-CM | POA: Diagnosis not present

## 2021-12-20 DIAGNOSIS — R1312 Dysphagia, oropharyngeal phase: Secondary | ICD-10-CM | POA: Diagnosis not present

## 2021-12-20 DIAGNOSIS — N179 Acute kidney failure, unspecified: Secondary | ICD-10-CM | POA: Diagnosis not present

## 2021-12-20 DIAGNOSIS — Z029 Encounter for administrative examinations, unspecified: Secondary | ICD-10-CM | POA: Diagnosis not present

## 2021-12-20 DIAGNOSIS — I480 Paroxysmal atrial fibrillation: Secondary | ICD-10-CM | POA: Diagnosis not present

## 2021-12-20 DIAGNOSIS — N390 Urinary tract infection, site not specified: Secondary | ICD-10-CM | POA: Diagnosis not present

## 2021-12-20 DIAGNOSIS — B351 Tinea unguium: Secondary | ICD-10-CM | POA: Diagnosis not present

## 2021-12-20 DIAGNOSIS — F0390 Unspecified dementia without behavioral disturbance: Secondary | ICD-10-CM | POA: Diagnosis not present

## 2021-12-20 DIAGNOSIS — F4321 Adjustment disorder with depressed mood: Secondary | ICD-10-CM | POA: Diagnosis not present

## 2021-12-20 DIAGNOSIS — I959 Hypotension, unspecified: Secondary | ICD-10-CM | POA: Diagnosis not present

## 2021-12-20 DIAGNOSIS — R262 Difficulty in walking, not elsewhere classified: Secondary | ICD-10-CM | POA: Diagnosis not present

## 2021-12-20 DIAGNOSIS — R7881 Bacteremia: Secondary | ICD-10-CM | POA: Diagnosis not present

## 2021-12-20 DIAGNOSIS — E871 Hypo-osmolality and hyponatremia: Secondary | ICD-10-CM | POA: Diagnosis not present

## 2021-12-20 DIAGNOSIS — M6258 Muscle wasting and atrophy, not elsewhere classified, other site: Secondary | ICD-10-CM | POA: Diagnosis not present

## 2021-12-20 DIAGNOSIS — Z7401 Bed confinement status: Secondary | ICD-10-CM | POA: Diagnosis not present

## 2021-12-20 DIAGNOSIS — M79609 Pain in unspecified limb: Secondary | ICD-10-CM | POA: Diagnosis not present

## 2021-12-20 DIAGNOSIS — F411 Generalized anxiety disorder: Secondary | ICD-10-CM | POA: Diagnosis not present

## 2021-12-20 DIAGNOSIS — I5032 Chronic diastolic (congestive) heart failure: Secondary | ICD-10-CM | POA: Diagnosis not present

## 2021-12-20 DIAGNOSIS — R41841 Cognitive communication deficit: Secondary | ICD-10-CM | POA: Diagnosis not present

## 2021-12-20 DIAGNOSIS — K5190 Ulcerative colitis, unspecified, without complications: Secondary | ICD-10-CM | POA: Diagnosis not present

## 2021-12-20 DIAGNOSIS — I10 Essential (primary) hypertension: Secondary | ICD-10-CM | POA: Diagnosis not present

## 2021-12-20 DIAGNOSIS — A4159 Other Gram-negative sepsis: Secondary | ICD-10-CM | POA: Diagnosis not present

## 2021-12-20 DIAGNOSIS — M6281 Muscle weakness (generalized): Secondary | ICD-10-CM | POA: Diagnosis not present

## 2021-12-20 DIAGNOSIS — R2689 Other abnormalities of gait and mobility: Secondary | ICD-10-CM | POA: Diagnosis not present

## 2021-12-20 DIAGNOSIS — J9811 Atelectasis: Secondary | ICD-10-CM | POA: Diagnosis not present

## 2021-12-20 DIAGNOSIS — B9689 Other specified bacterial agents as the cause of diseases classified elsewhere: Secondary | ICD-10-CM | POA: Diagnosis not present

## 2021-12-20 DIAGNOSIS — R55 Syncope and collapse: Secondary | ICD-10-CM | POA: Diagnosis not present

## 2021-12-20 LAB — SARS CORONAVIRUS 2 (TAT 6-24 HRS): SARS Coronavirus 2: NEGATIVE

## 2021-12-20 MED ORDER — SODIUM CHLORIDE 0.9 % IV SOLN: 1.0000 g | INTRAVENOUS | Status: AC

## 2021-12-20 MED ORDER — ERTAPENEM SODIUM 1 G IJ SOLR: 1.0000 g | INTRAMUSCULAR | Status: DC

## 2021-12-20 MED ORDER — CHLORHEXIDINE GLUCONATE CLOTH 2 % EX PADS: 6.0000 | MEDICATED_PAD | Freq: Every day | CUTANEOUS | Status: DC

## 2021-12-20 MED ORDER — SODIUM CHLORIDE 0.9% FLUSH: 10.0000 mL | INTRAVENOUS | Status: DC | PRN

## 2021-12-20 NOTE — Progress Notes (Signed)
PT Cancellation Note  Patient Details Name: Autumn Dean MRN: [id number] DOB: [birth year]   Cancelled Treatment:    Reason Eval/Treat Not Completed: Patient declined, no reason specified. Encouraged pt to participate and explained importance of mobilizing but she continued to decline.    Shary Decamp [employer] 12/20/2021, 2:50 PM [place] Pager [phone] Office [phone]

## 2021-12-20 NOTE — Discharge Summary (Addendum)
PATIENT DETAILS Name: Autumn Dean Age: 88 y.o. Sex: female Date of Birth: 10/13/1933 MRN: [id number]. Admitting Physician: Norval Morton, MD [id number], Costella Hatcher, MD  Admit Date: 12/16/2021 Discharge date: 12/20/2021  Recommendations for Outpatient Follow-up:  Follow up with PCP in 1-2 weeks Please obtain CMP/CBC in one week Please pull PICC line out once patient completes IV antimicrobial therapy course.  Admitted From:  Home  Disposition: Skilled nursing facility   Discharge Condition: good  CODE STATUS:   Code Status: DNR   Diet recommendation:  Diet Order             Diet - low sodium heart healthy           Diet Heart Room service appropriate? Yes; Fluid consistency: Thin  Diet effective now                    Brief Summary: Patient is a 88 y.o.  female with history of dementia, HTN, ulcerative colitis, GERD-who recently was treated for UTI with Bactrim-presented to the hospital with weakness-patient was subsequently found to have ESBL Klebsiella pneumonia bacteremia.  See below for further details.   Significant Hospital events: 2/13>> admit to [facility] for weakness/hypotension-found to have ESBL Klebsiella bacteremia   Significant imaging studies: 2/13>> CXR: No PNA   Significant microbiology data: 2/13>> COVID/influenza PCR: Negative 2/13>> urine culture: ESBL Klebsiella pneumoniae 2/13>> blood culture: ESBL Klebsiella pneumoniae 2/16>> blood culture: Pending   Procedures: None   Consults:  None  Brief Hospital Course: Sepsis due to ESBL Klebsiella bacteremia and complicated UTI: Sepsis physiology has resolved-treated with meropenem during this hospitalization-we will switch to ertapenem on discharge-suspect needs at least 7 days of treatment with antibiotics (discussed over the phone with ID MD Dr. Juleen China).  Repeat blood cultures negative-please follow until negative.  Please discontinue PICC line once patient continues on course of  antibiotics.     AKI: Likely hemodynamically mediated in the setting of sepsis-resolved with supportive care.   Chronic HFpEF: Euvolemic-continue to watch electrolytes closely.   PAF: Maintaining sinus rhythm-on Coreg-remains on Eliquis.   Minimally elevated troponin: Of no clinical significance.  No chest pain/anginal symptoms.   Hyponatremia: Mild-appears to be chronic-stable for monitoring periodically.   History of ulcerative colitis: No flare-continue sulfasalazine   GERD: Continue PPI   Dementia without behavioral disturbance: Appears to be mild-able to give history/answer questions appropriately.  Maintain delirium precautions.   Debility/deconditioning: Due to acute illness-evaluated by PT/OT with recommendations for SNF.   BMI: Estimated body mass index is 23.12 kg/m as calculated from the following:   Height as of this encounter: 5' 3"  (1.6 m).   Weight as of this encounter: 59.2 kg.    Pressure Injury Pressure Injury 12/16/21 Buttocks Right;Left Stage 1 -  Intact skin with non-blanchable redness of a localized area usually over a bony prominence. Bruising (Active)  12/16/21 2319  Location: Buttocks  Location Orientation: Right;Left  Staging: Stage 1 -  Intact skin with non-blanchable redness of a localized area usually over a bony prominence.  Wound Description (Comments): Bruising  Present on Admission: Yes     Discharge Diagnoses:  Principal Problem:   Hypotension Active Problems:   GERD (gastroesophageal reflux disease)   Left sided ulcerative colitis ([place])   Urinary tract infection   AKI (acute kidney injury) (HCC)   Paroxysmal atrial fibrillation (HCC)   Generalized weakness   Dementia without behavioral disturbance (HCC)   Chronic diastolic CHF (congestive heart failure) ([place])  Elevated troponin   Discharge Instructions:  Activity:  As tolerated with Full fall precautions use walker/cane & assistance as needed Discharge Instructions     Call  MD for:  extreme fatigue   Complete by: As directed    Call MD for:  persistant nausea and vomiting   Complete by: As directed    Call MD for:  temperature >100.4   Complete by: As directed    Diet - low sodium heart healthy   Complete by: As directed    Discharge instructions   Complete by: As directed    Follow with Primary MD  Glenda Chroman, MD in 1-2 weeks  Please get a complete blood count and chemistry panel checked by your Primary MD at your next visit, and again as instructed by your Primary MD.  Get Medicines reviewed and adjusted: Please take all your medications with you for your next visit with your Primary MD  Laboratory/radiological data: Please request your Primary MD to go over all hospital tests and procedure/radiological results at the follow up, please ask your Primary MD to get all Hospital records sent to his/her office.  In some cases, they will be blood work, cultures and biopsy results pending at the time of your discharge. Please request that your primary care M.D. follows up on these results.  Also Note the following: If you experience worsening of your admission symptoms, develop shortness of breath, life threatening emergency, suicidal or homicidal thoughts you must seek medical attention immediately by calling 911 or calling your MD immediately  if symptoms less severe.  You must read complete instructions/literature along with all the possible adverse reactions/side effects for all the Medicines you take and that have been prescribed to you. Take any new Medicines after you have completely understood and accpet all the possible adverse reactions/side effects.   Do not drive when taking Pain medications or sleeping medications (Benzodaizepines)  Do not take more than prescribed Pain, Sleep and Anxiety Medications. It is not advisable to combine anxiety,sleep and pain medications without talking with your primary care practitioner  Special Instructions: If  you have smoked or chewed Tobacco  in the last 2 yrs please stop smoking, stop any regular Alcohol  and or any Recreational drug use.  Wear Seat belts while driving.  Please note: You were cared for by a hospitalist during your hospital stay. Once you are discharged, your primary care physician will handle any further medical issues. Please note that NO REFILLS for any discharge medications will be authorized once you are discharged, as it is imperative that you return to your primary care physician (or establish a relationship with a primary care physician if you do not have one) for your post hospital discharge needs so that they can reassess your need for medications and monitor your lab values.   Increase activity slowly   Complete by: As directed    No dressing needed   Complete by: As directed       Allergies as of 12/20/2021       Reactions   Macrobid [nitrofurantoin Macrocrystal] Other (See Comments)   Extreme dizziness   Ciprofloxacin Nausea And Vomiting   Nitrofurantoin Nausea And Vomiting   Codeine Rash, Itching   Demerol Rash   Latex Rash   Redness        Medication List     STOP taking these medications    sulfamethoxazole-trimethoprim 800-160 MG tablet Commonly known as: BACTRIM DS  TAKE these medications    acetaminophen 500 MG tablet Commonly known as: TYLENOL Take 1,000 mg by mouth 2 (two) times daily.   acetaminophen 325 MG tablet Commonly known as: TYLENOL Take 650 mg by mouth every 6 (six) hours as needed for headache or fever (pain).   apixaban 2.5 MG Tabs tablet Commonly known as: ELIQUIS Take 2.5 mg by mouth 2 (two) times daily.   busPIRone 5 MG tablet Commonly known as: BUSPAR Take 5 mg by mouth 2 (two) times daily.   carvedilol 3.125 MG tablet Commonly known as: COREG Take 3.125 mg by mouth 2 (two) times daily.   cholecalciferol 25 MCG (1000 UNIT) tablet Commonly known as: VITAMIN D3 Take 1,000 Units by mouth every morning.    Desitin 40 % Pste Generic drug: Zinc Oxide Apply 1 application topically See admin instructions. Apply topically to redness and rash over perineal and per-rectal after cleaning area three times daily and as needed when soiled   Boudreauxs Butt Paste 16 % Oint Generic drug: Zinc Oxide Apply 1 application topically See admin instructions. Apply topically to redness and rash over perineal and peri-rectal after cleansing area - three times daily and after cleansing as needed.   docusate sodium 100 MG capsule Commonly known as: COLACE Take 100 mg by mouth at bedtime.   ertapenem 1,000 mg in sodium chloride 0.9 % 100 mL Inject 1,000 mg into the vein daily for 4 days.   esomeprazole 20 MG capsule Commonly known as: NEXIUM Take 1 capsule (20 mg total) by mouth daily at 12 noon. What changed: when to take this   estradiol 0.1 MG/GM vaginal cream Commonly known as: ESTRACE Place 1 Applicatorful vaginally once a week.   fluticasone 50 MCG/ACT nasal spray Commonly known as: FLONASE Place 1 spray into both nostrils daily as needed for allergies.   folic acid 1 MG tablet Commonly known as: FOLVITE TAKE 1 TABLET BY MOUTH DAILY What changed: when to take this   Lidocaine 4 % Ptch Place 1 patch onto the skin every morning. Apply to left hip   lisinopril 10 MG tablet Commonly known as: ZESTRIL Take 10 mg by mouth every morning.   magnesium hydroxide 400 MG/5ML suspension Commonly known as: MILK OF MAGNESIA Take 30 mLs by mouth at bedtime as needed (constipation (if no BM in prior 24 hours)).   psyllium 0.52 g capsule Commonly known as: REGULOID Take 0.52 g by mouth 2 (two) times daily.   senna-docusate 8.6-50 MG tablet Commonly known as: Senokot-S Take 1 tablet by mouth See admin instructions. Take one tablet by mouth every Monday, Wednesday, Friday at bedtime   sulfaSALAzine 500 MG tablet Commonly known as: AZULFIDINE Take 1 tablet (500 mg total) by mouth 3 (three) times  daily.   vitamin B-12 1000 MCG tablet Commonly known as: CYANOCOBALAMIN Take 1,000 mcg by mouth every morning.               Discharge Care Instructions  (From admission, onward)           Start     Ordered   12/20/21 0000  No dressing needed        12/20/21 1700            Contact information for follow-up providers     Jerene Bears B, MD. Schedule an appointment as soon as possible for a visit in 1 week(s).   Specialty: Internal Medicine Contact information: [address] [place] [address] [phone]  Contact information for after-discharge care     Destination     [facility] .   Service: Skilled Nursing Contact information: [place] [address] [phone]                    Allergies  Allergen Reactions   Macrobid [Nitrofurantoin Macrocrystal] Other (See Comments)    Extreme dizziness   Ciprofloxacin Nausea And Vomiting   Nitrofurantoin Nausea And Vomiting   Codeine Rash and Itching   Demerol Rash   Latex Rash    Redness     Other Procedures/Studies: DG Chest Port 1 View  Result Date: 12/20/2021 CLINICAL DATA:  Evaluate PICC line placement EXAM: PORTABLE CHEST 1 VIEW COMPARISON:  12/16/2021 FINDINGS: Interval placement of right arm PICC line. The tip terminates in the cavoatrial junction. Heart size appears normal. New subsegmental atelectasis noted within the lateral left lung base. No airspace consolidation, pleural effusion or edema. IMPRESSION: 1. Satisfactory position of right arm PICC line. 2. New left base subsegmental atelectasis. Electronically Signed   By: Kerby Moors M.D.   On: 12/20/2021 10:31   DG Chest Portable 1 View  Result Date: 12/16/2021 CLINICAL DATA:  Lightheadedness EXAM: PORTABLE CHEST 1 VIEW COMPARISON:  None. FINDINGS: The heart size and mediastinal contours are within normal limits. Aortic atherosclerosis. No focal airspace consolidation,  pleural effusion, or pneumothorax. The visualized skeletal structures are unremarkable. IMPRESSION: No active disease. Electronically Signed   By: Davina Poke D.O.   On: 12/16/2021 14:11   [place] EKG SITE RITE  Result Date: 12/20/2021 If Site Rite image not attached, placement could not be confirmed due to current cardiac rhythm.    TODAY-DAY OF DISCHARGE:  Subjective:   [facility] today has no headache,no chest abdominal pain,no new weakness tingling or numbness, feels much better wants to go home today.   Objective:   Blood pressure 135/86, pulse 97, temperature 98.5 F (36.9 C), temperature source Oral, resp. rate 16, height 5' 3"  (1.6 m), weight 56.3 kg, SpO2 97 %.  Intake/Output Summary (Last 24 hours) at 12/20/2021 1537 Last data filed at 12/20/2021 1400 Gross per 24 hour  Intake 420 ml  Output --  Net 420 ml   Filed Weights   12/18/21 1116 12/19/21 0400 12/20/21 0312  Weight: 58.5 kg 59.2 kg 56.3 kg    Exam: Awake Alert, Oriented *3, No new F.N deficits, Normal affect [place].AT,PERRAL Supple Neck,No JVD, No cervical lymphadenopathy appriciated.  Symmetrical Chest wall movement, Good air movement bilaterally, CTAB RRR,No Gallops,Rubs or new Murmurs, No Parasternal Heave +ve B.Sounds, Abd Soft, Non tender, No organomegaly appriciated, No rebound -guarding or rigidity. No Cyanosis, Clubbing or edema, No new Rash or bruise   PERTINENT RADIOLOGIC STUDIES: DG Chest Port 1 View  Result Date: 12/20/2021 CLINICAL DATA:  Evaluate PICC line placement EXAM: PORTABLE CHEST 1 VIEW COMPARISON:  12/16/2021 FINDINGS: Interval placement of right arm PICC line. The tip terminates in the cavoatrial junction. Heart size appears normal. New subsegmental atelectasis noted within the lateral left lung base. No airspace consolidation, pleural effusion or edema. IMPRESSION: 1. Satisfactory position of right arm PICC line. 2. New left base subsegmental atelectasis. Electronically Signed   By:  Kerby Moors M.D.   On: 12/20/2021 10:31   [place] EKG SITE RITE  Result Date: 12/20/2021 If Site Rite image not attached, placement could not be confirmed due to current cardiac rhythm.    PERTINENT LAB RESULTS: CBC: Recent Labs  12/18/21 0125  WBC 6.6  HGB 11.0*  HCT 34.3*  PLT 152   CMET CMP     Component Value Date/Time   NA 131 (L) 12/18/2021 0125   K 4.6 12/18/2021 0125   CL 101 12/18/2021 0125   CO2 21 (L) 12/18/2021 0125   GLUCOSE 121 (H) 12/18/2021 0125   BUN 21 12/18/2021 0125   CREATININE 0.92 12/18/2021 0125   CREATININE 0.96 05/08/2015 1505   CALCIUM 9.5 12/18/2021 0125   PROT 6.0 (L) 12/16/2021 1353   ALBUMIN 3.1 (L) 12/16/2021 1353   AST 19 12/16/2021 1353   ALT 11 12/16/2021 1353   ALKPHOS 75 12/16/2021 1353   BILITOT 0.7 12/16/2021 1353   GFRNONAA >60 12/18/2021 0125    GFR Estimated Creatinine Clearance: 36.3 mL/min (by C-G formula based on SCr of 0.92 mg/dL). No results for input(s): LIPASE, AMYLASE in the last 72 hours. No results for input(s): CKTOTAL, CKMB, CKMBINDEX, TROPONINI in the last 72 hours. Invalid input(s): POCBNP No results for input(s): DDIMER in the last 72 hours. No results for input(s): HGBA1C in the last 72 hours. No results for input(s): CHOL, HDL, LDLCALC, TRIG, CHOLHDL, LDLDIRECT in the last 72 hours. No results for input(s): TSH, T4TOTAL, T3FREE, THYROIDAB in the last 72 hours.  Invalid input(s): FREET3 No results for input(s): VITAMINB12, FOLATE, FERRITIN, TIBC, IRON, RETICCTPCT in the last 72 hours. Coags: No results for input(s): INR in the last 72 hours.  Invalid input(s): PT Microbiology: Recent Results (from the past 240 hour(s))  Resp Panel by RT-PCR (Flu A&B, Covid) Nasopharyngeal Swab     Status: None   Collection Time: 12/16/21  2:00 PM   Specimen: Nasopharyngeal Swab; Nasopharyngeal(NP) swabs in vial transport medium  Result Value Ref Range Status   SARS Coronavirus 2 by RT PCR NEGATIVE NEGATIVE Final     Comment: (NOTE) SARS-CoV-2 target nucleic acids are NOT DETECTED.  The SARS-CoV-2 RNA is generally detectable in upper respiratory specimens during the acute phase of infection. The lowest concentration of SARS-CoV-2 viral copies this assay can detect is 138 copies/mL. A negative result does not preclude SARS-Cov-2 infection and should not be used as the sole basis for treatment or other patient management decisions. A negative result may occur with  improper specimen collection/handling, submission of specimen other than nasopharyngeal swab, presence of viral mutation(s) within the areas targeted by this assay, and inadequate number of viral copies(<138 copies/mL). A negative result must be combined with clinical observations, patient history, and epidemiological information. The expected result is Negative.  Fact Sheet for Patients:  EntrepreneurPulse.com.au  Fact Sheet for Healthcare Providers:  IncredibleEmployment.be  This test is no t yet approved or cleared by the [place] FDA and  has been authorized for detection and/or diagnosis of SARS-CoV-2 by FDA under an Emergency Use Authorization (EUA). This EUA will remain  in effect (meaning this test can be used) for the duration of the COVID-19 declaration under Section 564(b)(1) of the Act, 21 U.S.C.section 360bbb-3(b)(1), unless the authorization is terminated  or revoked sooner.       Influenza A by PCR NEGATIVE NEGATIVE Final   Influenza B by PCR NEGATIVE NEGATIVE Final    Comment: (NOTE) The Xpert Xpress SARS-CoV-2/FLU/RSV plus assay is intended as an aid in the diagnosis of influenza from Nasopharyngeal swab specimens and should not be used as a sole basis for treatment. Nasal washings and aspirates are unacceptable for Xpert Xpress SARS-CoV-2/FLU/RSV testing.  Fact Sheet for Patients: EntrepreneurPulse.com.au  Fact Sheet  for Healthcare  Providers: IncredibleEmployment.be  This test is not yet approved or cleared by the [place] and has been authorized for detection and/or diagnosis of SARS-CoV-2 by FDA under an Emergency Use Authorization (EUA). This EUA will remain in effect (meaning this test can be used) for the duration of the COVID-19 declaration under Section 564(b)(1) of the Act, 21 U.S.C. section 360bbb-3(b)(1), unless the authorization is terminated or revoked.  Performed at [facility], [place] [address] [place], [place] [address]   Urine Culture     Status: Abnormal   Collection Time: 12/16/21  2:25 PM   Specimen: Urine, Clean Catch  Result Value Ref Range Status   Specimen Description URINE, CLEAN CATCH  Final   Special Requests   Final    NONE Performed at [facility], [place] [address], [place], [place] [address]    Culture (A)  Final    >=100,000 COLONIES/mL KLEBSIELLA PNEUMONIAE Confirmed Extended Spectrum Beta-Lactamase Producer (ESBL).  In bloodstream infections from ESBL organisms, carbapenems are preferred over piperacillin/tazobactam. They are shown to have a lower risk of mortality.    Report Status 12/18/2021 FINAL  Final   Organism ID, Bacteria KLEBSIELLA PNEUMONIAE (A)  Final      Susceptibility   Klebsiella pneumoniae - MIC*    AMPICILLIN >=32 RESISTANT Resistant     CEFAZOLIN >=64 RESISTANT Resistant     CEFEPIME >=32 RESISTANT Resistant     CEFTRIAXONE >=64 RESISTANT Resistant     CIPROFLOXACIN 2 RESISTANT Resistant     GENTAMICIN >=16 RESISTANT Resistant     IMIPENEM <=0.25 SENSITIVE Sensitive     NITROFURANTOIN 32 SENSITIVE Sensitive     TRIMETH/SULFA >=320 RESISTANT Resistant     AMPICILLIN/SULBACTAM >=32 RESISTANT Resistant     PIP/TAZO 8 SENSITIVE Sensitive     * >=100,000 COLONIES/mL KLEBSIELLA PNEUMONIAE  Blood culture (routine x 2)     Status: Abnormal   Collection Time: 12/16/21  2:30 PM   Specimen: BLOOD RIGHT ARM  Result Value  Ref Range Status   Specimen Description BLOOD RIGHT ARM  Final   Special Requests   Final    BOTTLES DRAWN AEROBIC AND ANAEROBIC Blood Culture adequate volume   Culture  Setup Time   Final    IN BOTH AEROBIC AND ANAEROBIC BOTTLES GRAM NEGATIVE RODS CRITICAL RESULT CALLED TO, READ BACK BY AND VERIFIED WITH: PHARMD GREG ABBOTT 12/17/21@6 :35 BY TW Performed at [facility], [place] [address] [place],  [address]    Culture (A)  Final    KLEBSIELLA PNEUMONIAE Confirmed Extended Spectrum Beta-Lactamase Producer (ESBL).  In bloodstream infections from ESBL organisms, carbapenems are preferred over piperacillin/tazobactam. They are shown to have a lower risk of mortality.    Report Status 12/19/2021 FINAL  Final   Organism ID, Bacteria KLEBSIELLA PNEUMONIAE  Final      Susceptibility   Klebsiella pneumoniae - MIC*    AMPICILLIN >=32 RESISTANT Resistant     CEFAZOLIN >=64 RESISTANT Resistant     CEFEPIME >=32 RESISTANT Resistant     CEFTAZIDIME RESISTANT Resistant     CEFTRIAXONE >=64 RESISTANT Resistant     CIPROFLOXACIN 1 RESISTANT Resistant     GENTAMICIN >=16 RESISTANT Resistant     IMIPENEM <=0.25 SENSITIVE Sensitive     TRIMETH/SULFA >=320 RESISTANT Resistant     AMPICILLIN/SULBACTAM >=32 RESISTANT Resistant     PIP/TAZO 8 SENSITIVE Sensitive     * KLEBSIELLA PNEUMONIAE  Blood Culture ID Panel (Reflexed)  Status: Abnormal   Collection Time: 12/16/21  2:30 PM  Result Value Ref Range Status   Enterococcus faecalis NOT DETECTED NOT DETECTED Final   Enterococcus Faecium NOT DETECTED NOT DETECTED Final   Listeria monocytogenes NOT DETECTED NOT DETECTED Final   Staphylococcus species NOT DETECTED NOT DETECTED Final   Staphylococcus aureus (BCID) NOT DETECTED NOT DETECTED Final   Staphylococcus epidermidis NOT DETECTED NOT DETECTED Final   Staphylococcus lugdunensis NOT DETECTED NOT DETECTED Final   Streptococcus species NOT DETECTED NOT DETECTED Final   Streptococcus  agalactiae NOT DETECTED NOT DETECTED Final   Streptococcus pneumoniae NOT DETECTED NOT DETECTED Final   Streptococcus pyogenes NOT DETECTED NOT DETECTED Final   A.calcoaceticus-baumannii NOT DETECTED NOT DETECTED Final   Bacteroides fragilis NOT DETECTED NOT DETECTED Final   Enterobacterales DETECTED (A) NOT DETECTED Final    Comment: Enterobacterales represent a large order of gram negative bacteria, not a single organism. CRITICAL RESULT CALLED TO, READ BACK BY AND VERIFIED WITH: PHARMD GREG ABBOTT 12/17/21@6 :35 BY TW    Enterobacter cloacae complex NOT DETECTED NOT DETECTED Final   Escherichia coli NOT DETECTED NOT DETECTED Final   Klebsiella aerogenes NOT DETECTED NOT DETECTED Final   Klebsiella oxytoca NOT DETECTED NOT DETECTED Final   Klebsiella pneumoniae DETECTED (A) NOT DETECTED Final    Comment: CRITICAL RESULT CALLED TO, READ BACK BY AND VERIFIED WITH: PHARMD GREG ABBOTT 12/17/21@6 :35 BY TW    Proteus species NOT DETECTED NOT DETECTED Final   Salmonella species NOT DETECTED NOT DETECTED Final   Serratia marcescens NOT DETECTED NOT DETECTED Final   Haemophilus influenzae NOT DETECTED NOT DETECTED Final   Neisseria meningitidis NOT DETECTED NOT DETECTED Final   Pseudomonas aeruginosa NOT DETECTED NOT DETECTED Final   Stenotrophomonas maltophilia NOT DETECTED NOT DETECTED Final   Candida albicans NOT DETECTED NOT DETECTED Final   Candida auris NOT DETECTED NOT DETECTED Final   Candida glabrata NOT DETECTED NOT DETECTED Final   Candida krusei NOT DETECTED NOT DETECTED Final   Candida parapsilosis NOT DETECTED NOT DETECTED Final   Candida tropicalis NOT DETECTED NOT DETECTED Final   Cryptococcus neoformans/gattii NOT DETECTED NOT DETECTED Final   CTX-M ESBL DETECTED (A) NOT DETECTED Final    Comment: CRITICAL RESULT CALLED TO, READ BACK BY AND VERIFIED WITH: PHARMD GREG ABBOTT 12/17/21@6 :35 BY TW (NOTE) Extended spectrum beta-lactamase detected. Recommend a carbapenem  as initial therapy.      Carbapenem resistance IMP NOT DETECTED NOT DETECTED Final   Carbapenem resistance KPC NOT DETECTED NOT DETECTED Final   Carbapenem resistance NDM NOT DETECTED NOT DETECTED Final   Carbapenem resist OXA 48 LIKE NOT DETECTED NOT DETECTED Final   Carbapenem resistance VIM NOT DETECTED NOT DETECTED Final    Comment: Performed at [facility], [place] [address], [place], [place] [address]  Blood culture (routine x 2)     Status: Abnormal   Collection Time: 12/16/21  2:40 PM   Specimen: BLOOD RIGHT ARM  Result Value Ref Range Status   Specimen Description BLOOD RIGHT ARM  Final   Special Requests   Final    BOTTLES DRAWN AEROBIC AND ANAEROBIC Blood Culture adequate volume   Culture  Setup Time   Final    GRAM NEGATIVE RODS IN BOTH AEROBIC AND ANAEROBIC BOTTLES CRITICAL VALUE NOTED.  VALUE IS CONSISTENT WITH PREVIOUSLY REPORTED AND CALLED VALUE.    Culture (A)  Final    KLEBSIELLA PNEUMONIAE SUSCEPTIBILITIES PERFORMED ON PREVIOUS CULTURE WITHIN THE LAST 5 DAYS. Performed at  [facility], [place] [address] [place], [place] [address]    Report Status 12/19/2021 FINAL  Final  Culture, blood (routine x 2)     Status: None (Preliminary result)   Collection Time: 12/19/21  7:30 AM   Specimen: BLOOD LEFT HAND  Result Value Ref Range Status   Specimen Description BLOOD LEFT HAND  Final   Special Requests   Final    BOTTLES DRAWN AEROBIC AND ANAEROBIC Blood Culture adequate volume   Culture   Final    NO GROWTH < 24 HOURS Performed at [facility], [place] [address] [place], [place] [address]    Report Status PENDING  Incomplete  Culture, blood (routine x 2)     Status: None (Preliminary result)   Collection Time: 12/19/21  7:42 AM   Specimen: BLOOD RIGHT HAND  Result Value Ref Range Status   Specimen Description BLOOD RIGHT HAND  Final   Special Requests   Final    BOTTLES DRAWN AEROBIC AND ANAEROBIC Blood Culture adequate volume   Culture   Final     NO GROWTH < 24 HOURS Performed at [facility], [place] [address], [place], [place] [address]    Report Status PENDING  Incomplete  SARS CORONAVIRUS 2 (TAT 6-24 HRS) Nasopharyngeal Nasopharyngeal Swab     Status: None   Collection Time: 12/19/21  6:20 PM   Specimen: Nasopharyngeal Swab  Result Value Ref Range Status   SARS Coronavirus 2 NEGATIVE NEGATIVE Final    Comment: (NOTE) SARS-CoV-2 target nucleic acids are NOT DETECTED.  The SARS-CoV-2 RNA is generally detectable in upper and lower respiratory specimens during the acute phase of infection. Negative results do not preclude SARS-CoV-2 infection, do not rule out co-infections with other pathogens, and should not be used as the sole basis for treatment or other patient management decisions. Negative results must be combined with clinical observations, patient history, and epidemiological information. The expected result is Negative.  Fact Sheet for Patients: SugarRoll.be  Fact Sheet for Healthcare Providers: https://www.woods-mathews.com/  This test is not yet approved or cleared by the [place] FDA and  has been authorized for detection and/or diagnosis of SARS-CoV-2 by FDA under an Emergency Use Authorization (EUA). This EUA will remain  in effect (meaning this test can be used) for the duration of the COVID-19 declaration under Se ction 564(b)(1) of the Act, 21 U.S.C. section 360bbb-3(b)(1), unless the authorization is terminated or revoked sooner.  Performed at [facility], [place] [address], [place], [place] [address]     FURTHER DISCHARGE INSTRUCTIONS:  Get Medicines reviewed and adjusted: Please take all your medications with you for your next visit with your Primary MD  Laboratory/radiological data: Please request your Primary MD to go over all hospital tests and procedure/radiological results at the follow up, please ask your Primary MD to get all  Hospital records sent to his/her office.  In some cases, they will be blood work, cultures and biopsy results pending at the time of your discharge. Please request that your primary care M.D. goes through all the records of your hospital data and follows up on these results.  Also Note the following: If you experience worsening of your admission symptoms, develop shortness of breath, life threatening emergency, suicidal or homicidal thoughts you must seek medical attention immediately by calling 911 or calling your MD immediately  if symptoms less severe.  You must read complete instructions/literature along with all the possible adverse reactions/side effects  for all the Medicines you take and that have been prescribed to you. Take any new Medicines after you have completely understood and accpet all the possible adverse reactions/side effects.   Do not drive when taking Pain medications or sleeping medications (Benzodaizepines)  Do not take more than prescribed Pain, Sleep and Anxiety Medications. It is not advisable to combine anxiety,sleep and pain medications without talking with your primary care practitioner  Special Instructions: If you have smoked or chewed Tobacco  in the last 2 yrs please stop smoking, stop any regular Alcohol  and or any Recreational drug use.  Wear Seat belts while driving.  Please note: You were cared for by a hospitalist during your hospital stay. Once you are discharged, your primary care physician will handle any further medical issues. Please note that NO REFILLS for any discharge medications will be authorized once you are discharged, as it is imperative that you return to your primary care physician (or establish a relationship with a primary care physician if you do not have one) for your post hospital discharge needs so that they can reassess your need for medications and monitor your lab values.  Total Time spent coordinating discharge including counseling,  education and face to face time equals greater than 30 minutes.  SignedOren Binet 12/20/2021 3:37 PM

## 2021-12-20 NOTE — TOC Progression Note (Signed)
Transition of Care [facility]) - Progression Note    Patient Details  Name: Autumn Dean MRN: [id number] Date of Birth: 05-Jul-1933  Transition of Care [facility]) CM/SW [place], LCSW Phone Number: 12/20/2021, 11:11 AM  Clinical Narrative:    Insurance approval received for [place], Ref#  [id number], Auth ID # [id number], effective  12/20/2021-12/24/2021.   Expected Discharge Plan: [place] Barriers to Discharge: Barriers Resolved  Expected Discharge Plan and Services Expected Discharge Plan: [place] In-house Referral: Clinical Social Work   Post Acute Care Choice: [place] Living arrangements for the past 2 months: [place] Expected Discharge Date: 12/20/21                                     Social Determinants of Health (SDOH) Interventions    Readmission Risk Interventions No flowsheet data found.

## 2021-12-20 NOTE — Care Management Important Message (Signed)
Important Message  Patient Details  Name: Autumn Dean MRN: [id number] Date of Birth: 08/26/1933   Medicare Important Message Given:  Yes  Patient has a contact precaution order in place will mail to the patient home address.    Ailis Rigaud 12/20/2021, 2:38 PM

## 2021-12-20 NOTE — Care Management Important Message (Signed)
Important Message  Patient Details  Name: Autumn Dean MRN: [id number] Date of Birth: 1932-11-20   Medicare Important Message Given:  Yes     Orbie Pyo 12/20/2021, 2:34 PM

## 2021-12-20 NOTE — Progress Notes (Signed)
Pt has DC order after the PICC line was placed. Report was called in, all questions answered, pending PTAR.

## 2021-12-20 NOTE — TOC Transition Note (Signed)
Transition of Care [facility]) - CM/SW Discharge Note   Patient Details  Name: Autumn Dean MRN: [id number] Date of Birth: [birth year]  Transition of Care [facility]) CM/SW Contact:  Benard Halsted, LCSW Phone Number: 12/20/2021, 12:59 PM   Clinical Narrative:    Patient will DC to: [facility] date: 12/20/21 Family notified: Nephew/POA, [name] Transport by: Corey Harold   Per MD patient ready for DC to [facility] [place]. RN to call report prior to discharge [phone] room 1203p). RN, patient, patient's family, and facility notified of DC. Discharge Summary and FL2 sent to facility. DC packet on chart. Ambulance transport requested for patient.   CSW will sign off for now as social work intervention is no longer needed. Please consult [place] again if new needs arise.     Final next level of care: Skilled Nursing Facility Barriers to Discharge: Barriers Resolved   Patient Goals and CMS Choice Patient states their goals for this hospitalization and ongoing recovery are:: Rehab CMS Medicare.gov Compare Post Acute Care list provided to:: Patient Represenative (must comment) Choice offered to / list presented to : [facility] POA / [facility])  Discharge Placement   Existing PASRR number confirmed : 12/20/21          Patient chooses bed at: [facility] Patient to be transferred to facility by: [place] Name of family member notified: [name] Patient and family notified of of transfer: 12/20/21  Discharge Plan and Services In-house Referral: Clinical Social Work   Post Acute Care Choice: [place]                               Social Determinants of Health (SDOH) Interventions     Readmission Risk Interventions No flowsheet data found.

## 2021-12-20 NOTE — Progress Notes (Signed)
Peripherally Inserted Central Catheter Placement  The IV Nurse has discussed with the patient and/or persons authorized to consent for the patient, the purpose of this procedure and the potential benefits and risks involved with this procedure.  The benefits include less needle sticks, lab draws from the catheter, and the patient may be discharged home with the catheter. Risks include, but not limited to, infection, bleeding, blood clot (thrombus formation), and puncture of an artery; nerve damage and irregular heartbeat and possibility to perform a PICC exchange if needed/ordered by physician.  Alternatives to this procedure were also discussed.  Bard Power PICC patient education guide, fact sheet on infection prevention and patient information card has been provided to patient /or left at bedside.    PICC Placement Documentation  PICC Single Lumen 38/75/64 Right Basilic 36 cm 0 cm (Active)  Indication for Insertion or Continuance of Line Prolonged intravenous therapies 12/20/21 1000  Exposed Catheter (cm) 0 cm 12/20/21 1000  Site Assessment Clean, Dry, Intact 12/20/21 1000  Line Status Flushed;Saline locked;Blood return noted 12/20/21 1000  Dressing Type Securing device;Transparent 12/20/21 1000  Dressing Status Antimicrobial disc in place;Clean, Dry, Intact 12/20/21 1000  Dressing Intervention New dressing;Other (Comment) 12/20/21 1000  Dressing Change Due 12/27/21 12/20/21 1000       Autumn Dean 12/20/2021, 10:17 AM

## 2021-12-24 LAB — CULTURE, BLOOD (ROUTINE X 2)
Culture: NO GROWTH
Culture: NO GROWTH
Special Requests: ADEQUATE
Special Requests: ADEQUATE

## 2021-12-26 DIAGNOSIS — B961 Klebsiella pneumoniae [K. pneumoniae] as the cause of diseases classified elsewhere: Secondary | ICD-10-CM | POA: Diagnosis not present

## 2021-12-26 DIAGNOSIS — K5190 Ulcerative colitis, unspecified, without complications: Secondary | ICD-10-CM | POA: Diagnosis not present

## 2021-12-26 DIAGNOSIS — E871 Hypo-osmolality and hyponatremia: Secondary | ICD-10-CM | POA: Diagnosis not present

## 2021-12-26 DIAGNOSIS — N390 Urinary tract infection, site not specified: Secondary | ICD-10-CM | POA: Diagnosis not present

## 2021-12-26 DIAGNOSIS — R7881 Bacteremia: Secondary | ICD-10-CM | POA: Diagnosis not present

## 2021-12-26 DIAGNOSIS — I480 Paroxysmal atrial fibrillation: Secondary | ICD-10-CM | POA: Diagnosis not present

## 2021-12-26 DIAGNOSIS — B9689 Other specified bacterial agents as the cause of diseases classified elsewhere: Secondary | ICD-10-CM | POA: Diagnosis not present

## 2021-12-26 DIAGNOSIS — N179 Acute kidney failure, unspecified: Secondary | ICD-10-CM | POA: Diagnosis not present

## 2021-12-26 DIAGNOSIS — I5032 Chronic diastolic (congestive) heart failure: Secondary | ICD-10-CM | POA: Diagnosis not present

## 2021-12-30 ENCOUNTER — Encounter: Payer: Self-pay | Admitting: Podiatrist

## 2021-12-30 ENCOUNTER — Ambulatory Visit (INDEPENDENT_AMBULATORY_CARE_PROVIDER_SITE_OTHER): Payer: Medicare PPO | Admitting: Podiatrist

## 2021-12-30 ENCOUNTER — Other Ambulatory Visit: Payer: Self-pay

## 2021-12-30 DIAGNOSIS — B351 Tinea unguium: Secondary | ICD-10-CM | POA: Diagnosis not present

## 2021-12-30 DIAGNOSIS — F411 Generalized anxiety disorder: Secondary | ICD-10-CM | POA: Diagnosis not present

## 2021-12-30 DIAGNOSIS — M79609 Pain in unspecified limb: Secondary | ICD-10-CM

## 2021-12-30 DIAGNOSIS — F03918 Unspecified dementia, unspecified severity, with other behavioral disturbance: Secondary | ICD-10-CM | POA: Diagnosis not present

## 2021-12-30 DIAGNOSIS — F4321 Adjustment disorder with depressed mood: Secondary | ICD-10-CM | POA: Diagnosis not present

## 2021-12-30 NOTE — Progress Notes (Signed)
HPI: Patient is 88 y.o. female who presents today for elongated toenails which are painful and difficult for her to trim.  She is also on anticoagulation of Eliquis and it is recommended she have professional periodic nail care.  She also relates she is been having pain in the heels bilateral.  Patient Active Problem List   Diagnosis Date Noted   Hypotension 12/16/2021   Urinary tract infection 12/16/2021   AKI (acute kidney injury) ([place]) 12/16/2021   Paroxysmal atrial fibrillation ([place]) 12/16/2021   Generalized weakness 12/16/2021   Dementia without behavioral disturbance ([place]) 12/16/2021   Chronic diastolic CHF (congestive heart failure) ([place]) 12/16/2021   Elevated troponin 12/16/2021   Chronic cystitis 06/29/2020   Left sided ulcerative colitis ([place]) 04/19/2019   Acute right-sided low back pain 04/16/2017   Tricuspid regurgitation 08/01/2014   Mitral regurgitation 07/08/2013   GERD (gastroesophageal reflux disease) 12/04/2011   UC (ulcerative colitis confined to rectum) ([place]) 12/04/2011   Hypertension 12/04/2011    Current Outpatient Medications on File Prior to Visit  Medication Sig Dispense Refill   acetaminophen (TYLENOL) 325 MG tablet Take 650 mg by mouth every 6 (six) hours as needed for headache or fever (pain).     acetaminophen (TYLENOL) 500 MG tablet Take 1,000 mg by mouth 2 (two) times daily.     apixaban (ELIQUIS) 2.5 MG TABS tablet Take 2.5 mg by mouth 2 (two) times daily.     busPIRone (BUSPAR) 5 MG tablet Take 5 mg by mouth 2 (two) times daily.     carvedilol (COREG) 3.125 MG tablet Take 3.125 mg by mouth 2 (two) times daily.     cholecalciferol (VITAMIN D3) 25 MCG (1000 UNIT) tablet Take 1,000 Units by mouth every morning.     docusate sodium (COLACE) 100 MG capsule Take 100 mg by mouth at bedtime.     esomeprazole (NEXIUM) 20 MG capsule Take 1 capsule (20 mg total) by mouth daily at 12 noon. (Patient taking differently: Take 20 mg by mouth every morning.) 30  capsule 11   estradiol (ESTRACE) 0.1 MG/GM vaginal cream Place 1 Applicatorful vaginally once a week.     fluticasone (FLONASE) 50 MCG/ACT nasal spray Place 1 spray into both nostrils daily as needed for allergies.     folic acid (FOLVITE) 1 MG tablet TAKE 1 TABLET BY MOUTH DAILY (Patient taking differently: Take 1 mg by mouth every morning.) 90 tablet 4   Lidocaine 4 % PTCH Place 1 patch onto the skin every morning. Apply to left hip     lisinopril (ZESTRIL) 10 MG tablet Take 10 mg by mouth every morning.     magnesium hydroxide (MILK OF MAGNESIA) 400 MG/5ML suspension Take 30 mLs by mouth at bedtime as needed (constipation (if no BM in prior 24 hours)).     psyllium (REGULOID) 0.52 g capsule Take 0.52 g by mouth 2 (two) times daily.     senna-docusate (SENOKOT-S) 8.6-50 MG tablet Take 1 tablet by mouth See admin instructions. Take one tablet by mouth every Monday, Wednesday, Friday at bedtime     sulfaSALAzine (AZULFIDINE) 500 MG tablet Take 1 tablet (500 mg total) by mouth 3 (three) times daily. 270 tablet 3   vitamin B-12 (CYANOCOBALAMIN) 1000 MCG tablet Take 1,000 mcg by mouth every morning.     Zinc Oxide (BOUDREAUXS BUTT PASTE) 16 % OINT Apply 1 application topically See admin instructions. Apply topically to redness and rash over perineal and peri-rectal after cleansing area - three  times daily and after cleansing as needed.     Zinc Oxide (DESITIN) 40 % PSTE Apply 1 application topically See admin instructions. Apply topically to redness and rash over perineal and per-rectal after cleaning area three times daily and as needed when soiled     [DISCONTINUED] sucralfate (CARAFATE) 1 GM/10ML suspension Take 10 mLs (1 g total) by mouth 4 (four) times daily. 420 mL 1   No current facility-administered medications on file prior to visit.    Allergies  Allergen Reactions   Macrobid [Nitrofurantoin Macrocrystal] Other (See Comments)    Extreme dizziness   Ciprofloxacin Nausea And Vomiting    Nitrofurantoin Nausea And Vomiting   Codeine Rash and Itching   Demerol Rash   Latex Rash    Redness    Review of Systems No fevers, chills, nausea, muscle aches, no difficulty breathing, no calf pain, no chest pain or shortness of breath.   Physical Exam  GENERAL APPEARANCE: Alert, conversant. Appropriately groomed. No acute distress.   VASCULAR: Pedal pulses palpable DP and PT bilateral.  Capillary refill time is immediate to all digits,  Proximal to distal cooling it warm to warm.  Digital perfusion adequate.   NEUROLOGIC: sensation is intact to 5.07 monofilament at 5/5 sites bilateral.  Light touch is intact bilateral, vibratory sensation intact bilateral  MUSCULOSKELETAL: acceptable muscle strength, tone and stability bilateral.  No gross boney pedal deformities noted.  No pain, crepitus or limitation noted with foot and ankle range of motion bilateral.  Subjective pain posterior aspect of bilateral heels is noted.  Mild tenderness on palpation noted.  No skin changes noted.  DERMATOLOGIC: skin is warm, supple, and dry.  No open lesions noted.  No rash, no pre ulcerative lesions.  Digital nails are elongated, thickened, discolored, dystrophic dystrophic, friable and brittle with pain on palpation with plantar pressure.  Assessment     ICD-10-CM   1. Pain due to onychomycosis of nail  B35.1    M79.609        Plan  Discussed treatment options and alternatives with the patient recommended routine debridements and this was carried out for her at today's visit.  I debrided the nails with a sterile nail nipper and Dremel without complication.  I recommended routine periodic debridements every 3 to 4 months or as needed.    I also recommended she float her heels on pillows when resting.  To keep the pressure off of the back of the heels.  She will be seen back for routine care and if any problems or concerns arise in the future she is instructed to call. Nails heels

## 2021-12-30 NOTE — Patient Instructions (Signed)
GENERAL FOOT HEALTH INFORMATION:  Moisturize your feet regularly with a cream based lotion.  One's I recommend are Cetaphil (Cream) or Eucerin (Cream)- usually available in a tub or crock type of container.  Avoid applying the cream to the toe interspaces themselves to reduce the risk of a fungal infection between the toes.  After showering or bathing be sure to dry well between your toes.     Watch your toenails for any signs of infection including drainage, pus redness or swelling along the sides of the toenails.  Soak in epsom salt water and use antibiotic ointment (OTC) if you notice this start to occur.  If the redness does not resolve within 2-3 days, call for an appointment to be seen.

## 2021-12-31 DIAGNOSIS — F0390 Unspecified dementia without behavioral disturbance: Secondary | ICD-10-CM | POA: Diagnosis not present

## 2021-12-31 DIAGNOSIS — R7881 Bacteremia: Secondary | ICD-10-CM | POA: Diagnosis not present

## 2021-12-31 DIAGNOSIS — I10 Essential (primary) hypertension: Secondary | ICD-10-CM | POA: Diagnosis not present

## 2021-12-31 DIAGNOSIS — Z029 Encounter for administrative examinations, unspecified: Secondary | ICD-10-CM | POA: Diagnosis not present

## 2021-12-31 DIAGNOSIS — F419 Anxiety disorder, unspecified: Secondary | ICD-10-CM | POA: Diagnosis not present

## 2022-01-01 ENCOUNTER — Encounter: Payer: Self-pay | Admitting: Podiatrist

## 2022-01-09 DIAGNOSIS — I480 Paroxysmal atrial fibrillation: Secondary | ICD-10-CM | POA: Diagnosis not present

## 2022-01-09 DIAGNOSIS — F0390 Unspecified dementia without behavioral disturbance: Secondary | ICD-10-CM | POA: Diagnosis not present

## 2022-01-09 DIAGNOSIS — I5032 Chronic diastolic (congestive) heart failure: Secondary | ICD-10-CM | POA: Diagnosis not present

## 2022-01-09 DIAGNOSIS — F419 Anxiety disorder, unspecified: Secondary | ICD-10-CM | POA: Diagnosis not present

## 2022-01-09 DIAGNOSIS — I10 Essential (primary) hypertension: Secondary | ICD-10-CM | POA: Diagnosis not present

## 2022-01-09 DIAGNOSIS — E871 Hypo-osmolality and hyponatremia: Secondary | ICD-10-CM | POA: Diagnosis not present

## 2022-01-27 DIAGNOSIS — F03918 Unspecified dementia, unspecified severity, with other behavioral disturbance: Secondary | ICD-10-CM | POA: Diagnosis not present

## 2022-01-27 DIAGNOSIS — F411 Generalized anxiety disorder: Secondary | ICD-10-CM | POA: Diagnosis not present

## 2022-01-27 DIAGNOSIS — F4321 Adjustment disorder with depressed mood: Secondary | ICD-10-CM | POA: Diagnosis not present

## 2022-02-11 DIAGNOSIS — N3020 Other chronic cystitis without hematuria: Secondary | ICD-10-CM | POA: Diagnosis not present

## 2022-02-11 DIAGNOSIS — R262 Difficulty in walking, not elsewhere classified: Secondary | ICD-10-CM | POA: Diagnosis not present

## 2022-02-11 DIAGNOSIS — F0390 Unspecified dementia without behavioral disturbance: Secondary | ICD-10-CM | POA: Diagnosis not present

## 2022-02-11 DIAGNOSIS — I480 Paroxysmal atrial fibrillation: Secondary | ICD-10-CM | POA: Diagnosis not present

## 2022-02-11 DIAGNOSIS — K5190 Ulcerative colitis, unspecified, without complications: Secondary | ICD-10-CM | POA: Diagnosis not present

## 2022-02-11 DIAGNOSIS — E871 Hypo-osmolality and hyponatremia: Secondary | ICD-10-CM | POA: Diagnosis not present

## 2022-02-11 DIAGNOSIS — I10 Essential (primary) hypertension: Secondary | ICD-10-CM | POA: Diagnosis not present

## 2022-02-11 DIAGNOSIS — F03A0 Unspecified dementia, mild, without behavioral disturbance, psychotic disturbance, mood disturbance, and anxiety: Secondary | ICD-10-CM | POA: Diagnosis not present

## 2022-02-11 DIAGNOSIS — I5032 Chronic diastolic (congestive) heart failure: Secondary | ICD-10-CM | POA: Diagnosis not present

## 2022-02-13 DIAGNOSIS — K5190 Ulcerative colitis, unspecified, without complications: Secondary | ICD-10-CM | POA: Diagnosis not present

## 2022-02-13 DIAGNOSIS — E559 Vitamin D deficiency, unspecified: Secondary | ICD-10-CM | POA: Diagnosis not present

## 2022-02-13 DIAGNOSIS — Z9189 Other specified personal risk factors, not elsewhere classified: Secondary | ICD-10-CM | POA: Diagnosis not present

## 2022-02-13 DIAGNOSIS — E538 Deficiency of other specified B group vitamins: Secondary | ICD-10-CM | POA: Diagnosis not present

## 2022-02-13 DIAGNOSIS — F0393 Unspecified dementia, unspecified severity, with mood disturbance: Secondary | ICD-10-CM | POA: Diagnosis not present

## 2022-02-25 DIAGNOSIS — R262 Difficulty in walking, not elsewhere classified: Secondary | ICD-10-CM | POA: Diagnosis not present

## 2022-02-25 DIAGNOSIS — M6258 Muscle wasting and atrophy, not elsewhere classified, other site: Secondary | ICD-10-CM | POA: Diagnosis not present

## 2022-02-25 DIAGNOSIS — R1312 Dysphagia, oropharyngeal phase: Secondary | ICD-10-CM | POA: Diagnosis not present

## 2022-02-25 DIAGNOSIS — M6281 Muscle weakness (generalized): Secondary | ICD-10-CM | POA: Diagnosis not present

## 2022-02-25 DIAGNOSIS — I509 Heart failure, unspecified: Secondary | ICD-10-CM | POA: Diagnosis not present

## 2022-02-25 DIAGNOSIS — K219 Gastro-esophageal reflux disease without esophagitis: Secondary | ICD-10-CM | POA: Diagnosis not present

## 2022-02-25 DIAGNOSIS — E559 Vitamin D deficiency, unspecified: Secondary | ICD-10-CM | POA: Diagnosis not present

## 2022-02-25 DIAGNOSIS — R2689 Other abnormalities of gait and mobility: Secondary | ICD-10-CM | POA: Diagnosis not present

## 2022-02-27 DIAGNOSIS — F4321 Adjustment disorder with depressed mood: Secondary | ICD-10-CM | POA: Diagnosis not present

## 2022-02-27 DIAGNOSIS — F411 Generalized anxiety disorder: Secondary | ICD-10-CM | POA: Diagnosis not present

## 2022-03-13 DIAGNOSIS — E785 Hyperlipidemia, unspecified: Secondary | ICD-10-CM | POA: Diagnosis not present

## 2022-03-13 DIAGNOSIS — N3020 Other chronic cystitis without hematuria: Secondary | ICD-10-CM | POA: Diagnosis not present

## 2022-03-13 DIAGNOSIS — Z789 Other specified health status: Secondary | ICD-10-CM | POA: Diagnosis not present

## 2022-03-13 DIAGNOSIS — N390 Urinary tract infection, site not specified: Secondary | ICD-10-CM | POA: Diagnosis not present

## 2022-03-17 DIAGNOSIS — K219 Gastro-esophageal reflux disease without esophagitis: Secondary | ICD-10-CM | POA: Diagnosis not present

## 2022-03-17 DIAGNOSIS — Z1331 Encounter for screening for depression: Secondary | ICD-10-CM | POA: Diagnosis not present

## 2022-03-17 DIAGNOSIS — Z029 Encounter for administrative examinations, unspecified: Secondary | ICD-10-CM | POA: Diagnosis not present

## 2022-03-17 DIAGNOSIS — D649 Anemia, unspecified: Secondary | ICD-10-CM | POA: Diagnosis not present

## 2022-03-17 DIAGNOSIS — E538 Deficiency of other specified B group vitamins: Secondary | ICD-10-CM | POA: Diagnosis not present

## 2022-03-17 DIAGNOSIS — E559 Vitamin D deficiency, unspecified: Secondary | ICD-10-CM | POA: Diagnosis not present

## 2022-03-27 DIAGNOSIS — R300 Dysuria: Secondary | ICD-10-CM | POA: Diagnosis not present

## 2022-03-27 DIAGNOSIS — F0390 Unspecified dementia without behavioral disturbance: Secondary | ICD-10-CM | POA: Diagnosis not present

## 2022-03-27 DIAGNOSIS — I10 Essential (primary) hypertension: Secondary | ICD-10-CM | POA: Diagnosis not present

## 2022-03-27 DIAGNOSIS — F411 Generalized anxiety disorder: Secondary | ICD-10-CM | POA: Diagnosis not present

## 2022-03-28 DIAGNOSIS — N390 Urinary tract infection, site not specified: Secondary | ICD-10-CM | POA: Diagnosis not present

## 2022-04-01 ENCOUNTER — Ambulatory Visit: Payer: Medicare PPO | Admitting: Podiatry

## 2022-04-01 DIAGNOSIS — N390 Urinary tract infection, site not specified: Secondary | ICD-10-CM | POA: Diagnosis not present

## 2022-04-01 DIAGNOSIS — N183 Chronic kidney disease, stage 3 unspecified: Secondary | ICD-10-CM | POA: Diagnosis not present

## 2022-04-01 DIAGNOSIS — N3020 Other chronic cystitis without hematuria: Secondary | ICD-10-CM | POA: Diagnosis not present

## 2022-04-01 DIAGNOSIS — F411 Generalized anxiety disorder: Secondary | ICD-10-CM | POA: Diagnosis not present

## 2022-04-08 DIAGNOSIS — M722 Plantar fascial fibromatosis: Secondary | ICD-10-CM | POA: Diagnosis not present

## 2022-04-08 DIAGNOSIS — I739 Peripheral vascular disease, unspecified: Secondary | ICD-10-CM | POA: Diagnosis not present

## 2022-04-08 DIAGNOSIS — L603 Nail dystrophy: Secondary | ICD-10-CM | POA: Diagnosis not present

## 2022-04-28 DIAGNOSIS — F411 Generalized anxiety disorder: Secondary | ICD-10-CM | POA: Diagnosis not present

## 2022-04-28 DIAGNOSIS — F03B4 Unspecified dementia, moderate, with anxiety: Secondary | ICD-10-CM | POA: Diagnosis not present

## 2022-04-30 DIAGNOSIS — E559 Vitamin D deficiency, unspecified: Secondary | ICD-10-CM | POA: Diagnosis not present

## 2022-05-13 DIAGNOSIS — I10 Essential (primary) hypertension: Secondary | ICD-10-CM | POA: Diagnosis not present

## 2022-05-13 DIAGNOSIS — N3020 Other chronic cystitis without hematuria: Secondary | ICD-10-CM | POA: Diagnosis not present

## 2022-05-13 DIAGNOSIS — I5032 Chronic diastolic (congestive) heart failure: Secondary | ICD-10-CM | POA: Diagnosis not present

## 2022-05-13 DIAGNOSIS — F39 Unspecified mood [affective] disorder: Secondary | ICD-10-CM | POA: Diagnosis not present

## 2022-05-13 DIAGNOSIS — R262 Difficulty in walking, not elsewhere classified: Secondary | ICD-10-CM | POA: Diagnosis not present

## 2022-05-13 DIAGNOSIS — I480 Paroxysmal atrial fibrillation: Secondary | ICD-10-CM | POA: Diagnosis not present

## 2022-05-13 DIAGNOSIS — E871 Hypo-osmolality and hyponatremia: Secondary | ICD-10-CM | POA: Diagnosis not present

## 2022-05-13 DIAGNOSIS — K5190 Ulcerative colitis, unspecified, without complications: Secondary | ICD-10-CM | POA: Diagnosis not present

## 2022-05-13 DIAGNOSIS — F0390 Unspecified dementia without behavioral disturbance: Secondary | ICD-10-CM | POA: Diagnosis not present

## 2022-06-12 ENCOUNTER — Emergency Department (HOSPITAL_COMMUNITY): Payer: Medicare PPO

## 2022-06-12 ENCOUNTER — Emergency Department (HOSPITAL_COMMUNITY)
Admission: EM | Admit: 2022-06-12 | Discharge: 2022-06-12 | Disposition: A | Discharge status: Home / Self Care | Payer: Medicare PPO | Attending: Emergency Medicine | Admitting: Emergency Medicine

## 2022-06-12 ENCOUNTER — Encounter (HOSPITAL_COMMUNITY): Payer: Self-pay

## 2022-06-12 ENCOUNTER — Other Ambulatory Visit: Payer: Self-pay

## 2022-06-12 DIAGNOSIS — Z7901 Long term (current) use of anticoagulants: Secondary | ICD-10-CM | POA: Insufficient documentation

## 2022-06-12 DIAGNOSIS — F0390 Unspecified dementia without behavioral disturbance: Secondary | ICD-10-CM | POA: Diagnosis not present

## 2022-06-12 DIAGNOSIS — W19XXXA Unspecified fall, initial encounter: Secondary | ICD-10-CM | POA: Insufficient documentation

## 2022-06-12 DIAGNOSIS — I6381 Other cerebral infarction due to occlusion or stenosis of small artery: Secondary | ICD-10-CM | POA: Diagnosis not present

## 2022-06-12 DIAGNOSIS — R609 Edema, unspecified: Secondary | ICD-10-CM | POA: Diagnosis not present

## 2022-06-12 DIAGNOSIS — M7989 Other specified soft tissue disorders: Secondary | ICD-10-CM | POA: Insufficient documentation

## 2022-06-12 DIAGNOSIS — S6992XA Unspecified injury of left wrist, hand and finger(s), initial encounter: Secondary | ICD-10-CM | POA: Diagnosis present

## 2022-06-12 DIAGNOSIS — Z91040 Latex allergy status: Secondary | ICD-10-CM | POA: Insufficient documentation

## 2022-06-12 DIAGNOSIS — Y92129 Unspecified place in nursing home as the place of occurrence of the external cause: Secondary | ICD-10-CM | POA: Insufficient documentation

## 2022-06-12 DIAGNOSIS — S52502A Unspecified fracture of the lower end of left radius, initial encounter for closed fracture: Secondary | ICD-10-CM | POA: Diagnosis not present

## 2022-06-12 DIAGNOSIS — S0990XA Unspecified injury of head, initial encounter: Secondary | ICD-10-CM | POA: Diagnosis not present

## 2022-06-12 DIAGNOSIS — Z79899 Other long term (current) drug therapy: Secondary | ICD-10-CM | POA: Insufficient documentation

## 2022-06-12 DIAGNOSIS — I10 Essential (primary) hypertension: Secondary | ICD-10-CM | POA: Diagnosis not present

## 2022-06-12 DIAGNOSIS — S52572A Other intraarticular fracture of lower end of left radius, initial encounter for closed fracture: Secondary | ICD-10-CM | POA: Diagnosis not present

## 2022-06-12 DIAGNOSIS — I4891 Unspecified atrial fibrillation: Secondary | ICD-10-CM | POA: Diagnosis not present

## 2022-06-12 DIAGNOSIS — Z7401 Bed confinement status: Secondary | ICD-10-CM | POA: Diagnosis not present

## 2022-06-12 LAB — CBC
HCT: 44.4 % (ref 36.0–46.0)
Hemoglobin: 14.6 g/dL (ref 12.0–15.0)
MCH: 31.8 pg (ref 26.0–34.0)
MCHC: 32.9 g/dL (ref 30.0–36.0)
MCV: 96.7 fL (ref 80.0–100.0)
Platelets: 241 10*3/uL (ref 150–400)
RBC: 4.59 MIL/uL (ref 3.87–5.11)
RDW: 12.2 % (ref 11.5–15.5)
WBC: 8.2 10*3/uL (ref 4.0–10.5)
nRBC: 0 % (ref 0.0–0.2)

## 2022-06-12 LAB — BASIC METABOLIC PANEL
Anion gap: 7 (ref 5–15)
BUN: 14 mg/dL (ref 8–23)
CO2: 25 mmol/L (ref 22–32)
Calcium: 10.5 mg/dL — ABNORMAL HIGH (ref 8.9–10.3)
Chloride: 104 mmol/L (ref 98–111)
Creatinine, Ser: 0.98 mg/dL (ref 0.44–1.00)
GFR, Estimated: 56 mL/min — ABNORMAL LOW (ref >60)
Glucose, Bld: 96 mg/dL (ref 70–99)
Potassium: 3.7 mmol/L (ref 3.5–5.1)
Sodium: 136 mmol/L (ref 135–145)

## 2022-06-12 MED ORDER — ACETAMINOPHEN 325 MG PO TABS
650.0000 mg | ORAL_TABLET | Freq: Once | ORAL | Status: AC
  Administered 2022-06-12: 650 mg via ORAL
  Filled 2022-06-12: qty 2

## 2022-06-12 MED ORDER — FENTANYL CITRATE PF 50 MCG/ML IJ SOSY
25.0000 ug | PREFILLED_SYRINGE | Freq: Once | INTRAMUSCULAR | Status: DC
  Filled 2022-06-12: qty 1

## 2022-06-12 MED ORDER — LIDOCAINE HCL 2 % IJ SOLN
10.0000 mL | Freq: Once | INTRAMUSCULAR | Status: AC
Start: 2022-06-12 — End: 2022-06-12
  Administered 2022-06-12: 200 mg
  Filled 2022-06-12: qty 20

## 2022-06-12 MED ORDER — FENTANYL CITRATE PF 50 MCG/ML IJ SOSY
50.0000 ug | PREFILLED_SYRINGE | Freq: Once | INTRAMUSCULAR | Status: AC
  Administered 2022-06-12: 50 ug via INTRAVENOUS
  Filled 2022-06-12: qty 1

## 2022-06-12 MED ORDER — HYDROCODONE-ACETAMINOPHEN 5-325 MG PO TABS: 1.0000 | ORAL_TABLET | ORAL | 0 refills | Status: DC | PRN

## 2022-06-12 MED ORDER — FENTANYL CITRATE PF 50 MCG/ML IJ SOSY
25.0000 ug | PREFILLED_SYRINGE | Freq: Once | INTRAMUSCULAR | Status: AC
  Administered 2022-06-12: 25 ug via INTRAVENOUS

## 2022-06-12 MED ORDER — HYDROCODONE-ACETAMINOPHEN 5-325 MG PO TABS: 1.0000 | ORAL_TABLET | ORAL | 0 refills | Status: AC | PRN

## 2022-06-12 MED ORDER — FENTANYL CITRATE PF 50 MCG/ML IJ SOSY
25.0000 ug | PREFILLED_SYRINGE | Freq: Once | INTRAMUSCULAR | Status: AC
  Administered 2022-06-12: 25 ug via INTRAVENOUS
  Filled 2022-06-12: qty 1

## 2022-06-12 MED ORDER — SODIUM CHLORIDE 0.9 % IV BOLUS
500.0000 mL | Freq: Once | INTRAVENOUS | Status: AC
  Administered 2022-06-12: 500 mL via INTRAVENOUS

## 2022-06-12 NOTE — ED Provider Notes (Signed)
Reduction of fracture  Date/Time: 06/12/2022 10:37 AM  Performed by: Sherwood Gambler, MD Authorized by: Sherwood Gambler, MD  Consent: Verbal consent obtained. Risks and benefits: risks, benefits and alternatives were discussed Consent given by: patient (and daughter) Required items: required blood products, implants, devices, and special equipment available Patient identity confirmed: verbally with patient Preparation: Patient was prepped and draped in the usual sterile fashion. Local anesthesia used: yes Anesthesia: hematoma block  Anesthesia: Local anesthesia used: yes Local Anesthetic: lidocaine 2% without epinephrine Anesthetic total: 7 mL  Sedation: Patient sedated: no  Patient tolerance: patient tolerated the procedure well with no immediate complications Comments: Initial traction didn't do much. Reduced with finger traps with better alignment       Sherwood Gambler, MD 06/12/22 1037

## 2022-06-12 NOTE — ED Notes (Signed)
Patient transported to CT scan . 

## 2022-06-12 NOTE — ED Provider Notes (Signed)
[facility] EMERGENCY DEPARTMENT Provider Note   CSN: [id number] Arrival date & time: 06/12/22  9833     History  Chief Complaint  Patient presents with   Fall   Wrist Pain    Autumn Dean is a 88 y.o. female.  HPI 88 year old female with hypertension, MR, paroxysmal A-fib presenting to the ER after a fall.  Patient was staying at her facility at [facility].  Got up in the middle of the night and lost her footing and fell.  She denies any chest pain, dizzy, shortness of breath surrounding the fall.  She denies hitting her head.  She is on Eliquis.  She did land on her left wrist and has pain to the left wrist.  She denies any numbness or tingling.  She denies any facial droop, confusion, weakness.  Family at bedside states she is at her mental baseline.    Home Medications Prior to Admission medications   Medication Sig Start Date End Date Taking? Authorizing Provider  acetaminophen (TYLENOL) 325 MG tablet Take 650 mg by mouth every 6 (six) hours as needed for headache or fever (pain).    [provider]  acetaminophen (TYLENOL) 500 MG tablet Take 1,000 mg by mouth 2 (two) times daily.    [provider]  apixaban (ELIQUIS) 2.5 MG TABS tablet Take 2.5 mg by mouth 2 (two) times daily.    [provider]  busPIRone (BUSPAR) 5 MG tablet Take 5 mg by mouth 2 (two) times daily.    [provider]  carvedilol (COREG) 3.125 MG tablet Take 3.125 mg by mouth 2 (two) times daily.    [provider]  cholecalciferol (VITAMIN D3) 25 MCG (1000 UNIT) tablet Take 1,000 Units by mouth every morning.    [provider]  docusate sodium (COLACE) 100 MG capsule Take 100 mg by mouth at bedtime.    [provider]  esomeprazole (NEXIUM) 20 MG capsule Take 1 capsule (20 mg total) by mouth daily at 12 noon. Patient taking differently: Take 20 mg by mouth every morning. 11/07/20   Rehman, Mechele Dawley, MD  estradiol (ESTRACE)  0.1 MG/GM vaginal cream Place 1 Applicatorful vaginally once a week.    [provider]  fluticasone (FLONASE) 50 MCG/ACT nasal spray Place 1 spray into both nostrils daily as needed for allergies.    [provider]  folic acid (FOLVITE) 1 MG tablet TAKE 1 TABLET BY MOUTH DAILY Patient taking differently: Take 1 mg by mouth every morning. 06/04/20   Ezzard Standing, PA-C  HYDROcodone-acetaminophen (NORCO/VICODIN) 5-325 MG tablet Take 1 tablet by mouth every 4 (four) hours as needed for up to 3 days. 06/12/22 06/15/22  Garald Balding, PA-C  Lidocaine 4 % PT[facility] Place 1 patch onto the skin every morning. Apply to left hip    [provider]  lisinopril (ZESTRIL) 10 MG tablet Take 10 mg by mouth every morning.    [provider]  magnesium hydroxide (MILK OF MAGNESIA) 400 MG/5ML suspension Take 30 mLs by mouth at bedtime as needed (constipation (if no BM in prior 24 hours)).    [provider]  psyllium (REGULOID) 0.52 g capsule Take 0.52 g by mouth 2 (two) times daily.    [provider]  senna-docusate (SENOKOT-S) 8.6-50 MG tablet Take 1 tablet by mouth See admin instructions. Take one tablet by mouth every Monday, Wednesday, Friday at bedtime    [provider]  sulfaSALAzine (AZULFIDINE) 500 MG  tablet Take 1 tablet (500 mg total) by mouth 3 (three) times daily. 10/30/20   Rehman, Mechele Dawley, MD  vitamin B-12 (CYANOCOBALAMIN) 1000 MCG tablet Take 1,000 mcg by mouth every morning.    [provider]  Zinc Oxide (BOUDREAUXS BUTT PASTE) 16 % OINT Apply 1 application topically See admin instructions. Apply topically to redness and rash over perineal and peri-rectal after cleansing area - three times daily and after cleansing as needed.    [provider]  Zinc Oxide (DESITIN) 40 % PSTE Apply 1 application topically See admin instructions. Apply topically to redness and rash over perineal and per-rectal after cleaning area three  times daily and as needed when soiled    [provider]  sucralfate (CARAFATE) 1 GM/10ML suspension Take 10 mLs (1 g total) by mouth 4 (four) times daily. 12/04/11 01/28/12  Setzer, Rona Ravens, NP      Allergies    Macrobid [nitrofurantoin macrocrystal], Ciprofloxacin, Nitrofurantoin, Codeine, Demerol, and Latex    Review of Systems   Review of Systems Ten systems reviewed and are negative for acute change, except as noted in the HPI.   Physical Exam Updated Vital Signs BP (!) 170/97   Pulse 95   Temp 98 F (36.7 C)   Resp 18   Ht 5' 3"  (1.6 m)   Wt 56.3 kg   SpO2 96%   BMI 21.99 kg/m  Physical Exam Vitals and nursing note reviewed.  Constitutional:      General: She is not in acute distress.    Appearance: She is well-developed.  HENT:     Head: Normocephalic and atraumatic.  Eyes:     Conjunctiva/sclera: Conjunctivae normal.  Cardiovascular:     Rate and Rhythm: Normal rate and regular rhythm.     Heart sounds: No murmur heard. Pulmonary:     Effort: Pulmonary effort is normal. No respiratory distress.     Breath sounds: Normal breath sounds.  Abdominal:     Palpations: Abdomen is soft.     Tenderness: There is no abdominal tenderness.  Musculoskeletal:        General: Swelling and tenderness present.     Cervical back: Neck supple.     Comments: Left wrist with visible deformity, bruising, left hand with radial deviation, evidence of RA to digits.  Pulses intact, sensations intact.  Skin:    General: Skin is warm and dry.     Capillary Refill: Capillary refill takes less than 2 seconds.  Neurological:     Mental Status: She is alert.  Psychiatric:        Mood and Affect: Mood normal.     ED Results / Procedures / Treatments   Labs (all labs ordered are listed, but only abnormal results are displayed) Labs Reviewed  BASIC METABOLIC PANEL - Abnormal; Notable for the following components:      Result Value   Calcium 10.5 (*)    GFR, Estimated 56 (*)     All other components within normal limits  CBC    EKG EKG Interpretation  Date/Time:  Thursday June 12 2022 06:51:26 EDT Ventricular Rate:  102 PR Interval:    QRS Duration: 70 QT Interval:  324 QTC Calculation: 422 R Axis:   21 Text Interpretation: Atrial fibrillation with rapid ventricular response Septal infarct , age undetermined similar to Feb 2023 Confirmed by Sherwood Gambler [phone]) on 06/12/2022 7:04:26 AM  Radiology DG Wrist 2 Views Left  Result Date: 06/12/2022 CLINICAL DATA:  Distal radial  fracture post reduction. EXAM: LEFT WRIST - 2 VIEW COMPARISON:  Radiographs same date. FINDINGS: The wrist remains splinted. The alignment of the intra-articular fracture of the distal radius is unchanged with residual impaction and palmar displacement. The distal ulna and carpal bones appear intact. Degenerative changes are present in the radial aspect of the wrist. IMPRESSION: No significant change in alignment of intra-articular fracture of the distal radius. Electronically Signed   By: Richardean Sale M.D.   On: 06/12/2022 09:19   DG Wrist 2 Views Left  Result Date: 06/12/2022 CLINICAL DATA:  Postreduction distal radius fracture. EXAM: LEFT WRIST - 2 VIEW COMPARISON:  Earlier same date. FINDINGS: Stable appearing distal radius fracture with palmar impaction and angulation. The wrist is now in a sugar-tong type fiberglass splint. IMPRESSION: Distal radius fracture with palmar impaction and angulation. Electronically Signed   By: Marijo Sanes M.D.   On: 06/12/2022 08:23   CT Head Wo Contrast  Result Date: 06/12/2022 CLINICAL DATA:  Fall with blunt polytrauma. EXAM: CT HEAD WITHOUT CONTRAST TECHNIQUE: Contiguous axial images were obtained from the base of the skull through the vertex without intravenous contrast. RADIATION DOSE REDUCTION: This exam was performed according to the departmental dose-optimization program which includes automated exposure control, adjustment of the mA and/or  kV according to patient size and/or use of iterative reconstruction technique. COMPARISON:  None Available. FINDINGS: Brain: There is mild-to-moderate cerebrocerebellar atrophy and atrophic ventriculomegaly, and moderately advanced small-vessel disease of the cerebral white matter. There are a few bilateral tiny chronic gangliocapsular lacunar infarctions, with no focal asymmetry concerning for an acute cortical based infarct, hemorrhage, mass or midline shift. Basal cisterns are clear. Vascular: There are calcifications in the carotid siphons, distal right vertebral artery but no hyperdense central vessels. Skull: There is no scalp hematoma. No calvarial fracture is seen or focal lesion. Sinuses/Orbits: The visualized sinuses and visualized left mastoid air cells are clear. There is patchy fluid in the lower right mastoid air cells, above which the remaining right mastoids are clear. Incidental note pneumatization of the left-greater-than-right petrous apices. Other: None. IMPRESSION: 1. No acute intracranial CT findings or depressed skull fractures. Chronic changes. 2. Right mastoid effusion. Electronically Signed   By: Telford Nab M.D.   On: 06/12/2022 07:20   DG Wrist Complete Left  Result Date: 06/12/2022 CLINICAL DATA:  Left wrist fracture.  Fell. EXAM: LEFT WRIST - COMPLETE 3+ VIEW; LEFT HAND - COMPLETE 3+ VIEW COMPARISON:  None Available. FINDINGS: Left wrist: Generalized osteopenia. There is acute closed oblique intra-articular fracture of the distal radial metaphysis, with the distal fragment showing mild ventral tilting and slight proximal displacement. Difficult to exclude at least a few small comminution fragments in between the fracture margins. The carpal bones, distal ulna are intact. Joint spaces appear maintained aside from bone-on-bone advanced first [facility] joint space loss and moderate osteophytosis. Left hand: The fingers are partially flexed limiting evaluation of the digits. There is  osteopenia without appreciable displaced fractures. There are at least mild degenerative changes of interphalangeal joints but this is not well evaluated. IMPRESSION: Osteopenia with closed acute intra-articular distal radius fracture as detailed above. Advanced first [place] DJD. Electronically Signed   By: Telford Nab M.D.   On: 06/12/2022 06:00   DG Hand Complete Left  Result Date: 06/12/2022 CLINICAL DATA:  Left wrist fracture.  Fell. EXAM: LEFT WRIST - COMPLETE 3+ VIEW; LEFT HAND - COMPLETE 3+ VIEW COMPARISON:  None Available. FINDINGS: Left wrist: Generalized osteopenia. There is acute closed  oblique intra-articular fracture of the distal radial metaphysis, with the distal fragment showing mild ventral tilting and slight proximal displacement. Difficult to exclude at least a few small comminution fragments in between the fracture margins. The carpal bones, distal ulna are intact. Joint spaces appear maintained aside from bone-on-bone advanced first [facility] joint space loss and moderate osteophytosis. Left hand: The fingers are partially flexed limiting evaluation of the digits. There is osteopenia without appreciable displaced fractures. There are at least mild degenerative changes of interphalangeal joints but this is not well evaluated. IMPRESSION: Osteopenia with closed acute intra-articular distal radius fracture as detailed above. Advanced first [place] DJD. Electronically Signed   By: Telford Nab M.D.   On: 06/12/2022 06:00    Procedures Procedures    Medications Ordered in ED Medications  fentaNYL (SUBLIMAZE) injection 50 mcg (has no administration in time range)  acetaminophen (TYLENOL) tablet 650 mg (650 mg Oral Given 06/12/22 0654)  lidocaine (XYLOCAINE) 2 % (with pres) injection 200 mg (200 mg Infiltration Given by Other 06/12/22 0736)  fentaNYL (SUBLIMAZE) injection 25 mcg (25 mcg Intravenous Given 06/12/22 0731)  fentaNYL (SUBLIMAZE) injection 25 mcg (25 mcg Intravenous Given 06/12/22 0756)   sodium chloride 0.9 % bolus 500 mL (500 mLs Intravenous New Bag/Given 06/12/22 0830)    ED Course/ Medical Decision Making/ A&P Clinical Course as of 06/12/22 0958  Thu Jun 12, 2022  0726 CT Head Wo Contrast IMPRESSION: 1. No acute intracranial CT findings or depressed skull fractures. Chronic changes. 2. Right mastoid effusion.   [MB]  0726 DG Wrist Complete Left IMPRESSION: Osteopenia with closed acute intra-articular distal radius fracture as detailed above.   [MB]    Clinical Course User Index [MB] Garald Balding, PA-C                           Medical Decision Making Amount and/or Complexity of Data Reviewed Labs: ordered. Radiology: ordered. Decision-making details documented in ED Course.  Risk OTC drugs. Prescription drug management.   88 year old female presenting to the ER after a fall.  She does not have any focal deficits on exam, is and is not altered.  Reportedly just lost her footing and fell.  She is on Eliquis.  She is hypertensive on arrival however I suspect this is secondary to pain.  Skin exam with visible left wrist deformity and bruising.  I ordered, reviewed and interpreted her imaging, BMP and CBC largely unremarkable, calcium slightly elevated, was given IV fluids for this.  Low suspicion for UTI as she is at her mental baseline.  Her EKG is consistent with atrial fibrillation, her EKG read RVR however her heart rate has been consistently in the 80s/90s on my exam, appears rate controlled.  I ordered, reviewed her imaging, agree with radiology read.  CT of the head without any acute abnormalities, mild right mastoid effusion noted.  X-ray of the left wrist with distal radial fracture.  She was given 50 mg of fentanyl and Tylenol, hematoma block with 2% lidocaine was performed and subsequent manipulation of the wrist for reduction performed by myself and Dr. Verner Chol.  Initial repeat images without any significant changes.  Discussed the case with  [facility], recommending fingertrap.  Orthotec placed in finger traps and reduced with interval improvement in displacement.  Dr. Grandville Silos reviewed the imaging and is okay with the reduction.  She was splinted in a sugar-tong splint.  Will send home with Norco for pain, PDMP reviewed,  appropriate.  She will follow-up with Dr. Grandville Silos. Per his request, [place] informed to stop Eliquis for impending surgery. Family updated as well.   We discussed return precautions.  She voiced her standing and is agreeable.  Stable for discharge.  This was a shared visit with my supervising physician Dr.Goldston  who independently saw and evaluated the patient & provided guidance in evaluation/management/disposition ,in agreement with care     Final Clinical Impression(s) / ED Diagnoses Final diagnoses:  Closed fracture of distal end of left radius, unspecified fracture morphology, initial encounter    Rx / DC Orders ED Discharge Orders          Ordered    HYDROcodone-acetaminophen (NORCO/VICODIN) 5-325 MG tablet  Every 4 hours PRN,   Status:  Discontinued        06/12/22 0952    HYDROcodone-acetaminophen (NORCO/VICODIN) 5-325 MG tablet  Every 4 hours PRN        06/12/22 0953              Garald Balding, PA-C 06/12/22 3643    Sherwood Gambler, MD 06/12/22 1036

## 2022-06-12 NOTE — ED Provider Triage Note (Signed)
Emergency Medicine Provider Triage Evaluation Note  Autumn Dean , a 88 y.o. female  was evaluated in triage.  Patient presents from skilled nursing facility for evaluation of fall with left wrist injury.  Patient reports she was walking to the bathroom and had a fall.  Denies prodromal dizziness, lightness, chest pain, or dyspnea.  States that she injured her left wrist.  She does not believe she hit her head.  She is on Eliquis.  History of dementia.  Denies numbness or tingling to her digits. She is right hand dominant.   Review of Systems  Per above  Physical Exam  BP (!) 150/96 (BP Location: Right Arm)   Pulse 89   Temp 97.9 F (36.6 C)   Resp 17   Ht 5' 3"  (1.6 m)   Wt 56.3 kg   SpO2 98%   BMI 21.99 kg/m  Gen:   Awake Resp:  Normal effort.  Breath sounds present bilaterally. Cardiac: 2+ radial pulse.  Other:  Left wrist deformity with tenderness to palpation.  Ranging throughout the right upper extremity.  No midline spinal tenderness.  No chest wall tenderness.  No abdominal tenderness.  Less than 3-second capillary refill to the digits.  Medical Decision Making  Medically screening exam initiated at 5:27 AM.  Appropriate orders placed.  SHANIRA TINE was informed that the remainder of the evaluation will be completed by another provider, this initial triage assessment does not replace that evaluation, and the importance of remaining in the ED until their evaluation is complete.  Left wrist and hand x-rays ordered given her obvious deformity.  She does have a 2+ radial pulse with less than 3-second capillary refill in the digits.  Patient does not believe that she hit her head, however given her history of dementia and that she is on Eliquis will get CT head without contrast.  Fall.    Amaryllis Dyke, PA-C 06/12/22 0530

## 2022-06-12 NOTE — Progress Notes (Signed)
Orthopedic Tech Progress Note Patient Details:  Autumn Dean 1933-04-20 [id number] MD attempted to reduce wrist and assisted with applying sugartong splint  Ortho Devices Type of Ortho Device: Sugartong splint, Arm sling Ortho Device/Splint Location: LUE Ortho Device/Splint Interventions: Ordered, Application   Post Interventions Patient Tolerated: Well  [name] A [name] 06/12/2022, 8:09 AM

## 2022-06-12 NOTE — Discharge Instructions (Addendum)
You were evaluated in the Emergency Department and after careful evaluation, we did not find any emergent condition requiring admission or further testing in the hospital.  Your workup today showed that you have a left distal radial fracture.  You have been splinted for this.  Please take the Norco as needed for pain.  Do not take additional Tylenol with this.  Please make sure to stop your Eliquis for impending surgery.  [facility]'s office will be reaching out to you to schedule further care.  Please return to the Emergency Department if you experience any worsening of your condition.    Thank you for allowing [place] to be a part of your care.

## 2022-06-12 NOTE — ED Notes (Signed)
ED Provider at bedside. 

## 2022-06-12 NOTE — ED Notes (Signed)
Ortho tech informed will be needed by EDP.

## 2022-06-12 NOTE — ED Triage Notes (Signed)
Pt was at [facility] where she got up to use the bathroom and had a fall. Fall was unwitnessed. Pt did not hit head. Pt is on blood thinners. C/O left wrist deformity but pulses are present. Pt has history of dementia

## 2022-06-13 DIAGNOSIS — I10 Essential (primary) hypertension: Secondary | ICD-10-CM | POA: Diagnosis not present

## 2022-06-13 DIAGNOSIS — N3020 Other chronic cystitis without hematuria: Secondary | ICD-10-CM | POA: Diagnosis not present

## 2022-06-13 DIAGNOSIS — S52502A Unspecified fracture of the lower end of left radius, initial encounter for closed fracture: Secondary | ICD-10-CM | POA: Diagnosis not present

## 2022-06-13 DIAGNOSIS — R262 Difficulty in walking, not elsewhere classified: Secondary | ICD-10-CM | POA: Diagnosis not present

## 2022-06-13 DIAGNOSIS — I480 Paroxysmal atrial fibrillation: Secondary | ICD-10-CM | POA: Diagnosis not present

## 2022-06-13 DIAGNOSIS — K5190 Ulcerative colitis, unspecified, without complications: Secondary | ICD-10-CM | POA: Diagnosis not present

## 2022-06-13 DIAGNOSIS — F0390 Unspecified dementia without behavioral disturbance: Secondary | ICD-10-CM | POA: Diagnosis not present

## 2022-06-13 DIAGNOSIS — F39 Unspecified mood [affective] disorder: Secondary | ICD-10-CM | POA: Diagnosis not present

## 2022-06-13 DIAGNOSIS — I5032 Chronic diastolic (congestive) heart failure: Secondary | ICD-10-CM | POA: Diagnosis not present

## 2022-06-16 DIAGNOSIS — F32A Depression, unspecified: Secondary | ICD-10-CM | POA: Diagnosis not present

## 2022-06-16 DIAGNOSIS — F03B4 Unspecified dementia, moderate, with anxiety: Secondary | ICD-10-CM | POA: Diagnosis not present

## 2022-06-16 DIAGNOSIS — F411 Generalized anxiety disorder: Secondary | ICD-10-CM | POA: Diagnosis not present

## 2022-06-17 DIAGNOSIS — S52135A Nondisplaced fracture of neck of left radius, initial encounter for closed fracture: Secondary | ICD-10-CM | POA: Diagnosis not present

## 2022-06-18 DIAGNOSIS — Z9181 History of falling: Secondary | ICD-10-CM | POA: Diagnosis not present

## 2022-06-18 DIAGNOSIS — R262 Difficulty in walking, not elsewhere classified: Secondary | ICD-10-CM | POA: Diagnosis not present

## 2022-06-19 DIAGNOSIS — R262 Difficulty in walking, not elsewhere classified: Secondary | ICD-10-CM | POA: Diagnosis not present

## 2022-06-19 DIAGNOSIS — F1120 Opioid dependence, uncomplicated: Secondary | ICD-10-CM | POA: Diagnosis not present

## 2022-06-19 DIAGNOSIS — S62102D Fracture of unspecified carpal bone, left wrist, subsequent encounter for fracture with routine healing: Secondary | ICD-10-CM | POA: Diagnosis not present

## 2022-06-19 DIAGNOSIS — F39 Unspecified mood [affective] disorder: Secondary | ICD-10-CM | POA: Diagnosis not present

## 2022-06-19 DIAGNOSIS — R198 Other specified symptoms and signs involving the digestive system and abdomen: Secondary | ICD-10-CM | POA: Diagnosis not present

## 2022-06-19 DIAGNOSIS — Z9181 History of falling: Secondary | ICD-10-CM | POA: Diagnosis not present

## 2022-06-21 DIAGNOSIS — K6289 Other specified diseases of anus and rectum: Secondary | ICD-10-CM | POA: Diagnosis not present

## 2022-06-21 DIAGNOSIS — R262 Difficulty in walking, not elsewhere classified: Secondary | ICD-10-CM | POA: Diagnosis not present

## 2022-06-21 DIAGNOSIS — Z9181 History of falling: Secondary | ICD-10-CM | POA: Diagnosis not present

## 2022-06-23 DIAGNOSIS — I10 Essential (primary) hypertension: Secondary | ICD-10-CM | POA: Diagnosis not present

## 2022-06-24 DIAGNOSIS — K649 Unspecified hemorrhoids: Secondary | ICD-10-CM | POA: Diagnosis not present

## 2022-06-24 DIAGNOSIS — F39 Unspecified mood [affective] disorder: Secondary | ICD-10-CM | POA: Diagnosis not present

## 2022-06-24 DIAGNOSIS — R198 Other specified symptoms and signs involving the digestive system and abdomen: Secondary | ICD-10-CM | POA: Diagnosis not present

## 2022-06-24 DIAGNOSIS — R262 Difficulty in walking, not elsewhere classified: Secondary | ICD-10-CM | POA: Diagnosis not present

## 2022-06-24 DIAGNOSIS — Z9181 History of falling: Secondary | ICD-10-CM | POA: Diagnosis not present

## 2022-06-24 DIAGNOSIS — S62102D Fracture of unspecified carpal bone, left wrist, subsequent encounter for fracture with routine healing: Secondary | ICD-10-CM | POA: Diagnosis not present

## 2022-06-25 DIAGNOSIS — Z9181 History of falling: Secondary | ICD-10-CM | POA: Diagnosis not present

## 2022-06-25 DIAGNOSIS — R262 Difficulty in walking, not elsewhere classified: Secondary | ICD-10-CM | POA: Diagnosis not present

## 2022-06-26 DIAGNOSIS — S52135A Nondisplaced fracture of neck of left radius, initial encounter for closed fracture: Secondary | ICD-10-CM | POA: Diagnosis not present

## 2022-06-26 DIAGNOSIS — R262 Difficulty in walking, not elsewhere classified: Secondary | ICD-10-CM | POA: Diagnosis not present

## 2022-06-26 DIAGNOSIS — Z9181 History of falling: Secondary | ICD-10-CM | POA: Diagnosis not present

## 2022-06-27 DIAGNOSIS — Z9181 History of falling: Secondary | ICD-10-CM | POA: Diagnosis not present

## 2022-06-27 DIAGNOSIS — R262 Difficulty in walking, not elsewhere classified: Secondary | ICD-10-CM | POA: Diagnosis not present

## 2022-06-29 DIAGNOSIS — R262 Difficulty in walking, not elsewhere classified: Secondary | ICD-10-CM | POA: Diagnosis not present

## 2022-06-29 DIAGNOSIS — Z9181 History of falling: Secondary | ICD-10-CM | POA: Diagnosis not present

## 2022-06-30 DIAGNOSIS — Z9181 History of falling: Secondary | ICD-10-CM | POA: Diagnosis not present

## 2022-06-30 DIAGNOSIS — R262 Difficulty in walking, not elsewhere classified: Secondary | ICD-10-CM | POA: Diagnosis not present

## 2022-07-02 DIAGNOSIS — Z9181 History of falling: Secondary | ICD-10-CM | POA: Diagnosis not present

## 2022-07-02 DIAGNOSIS — R262 Difficulty in walking, not elsewhere classified: Secondary | ICD-10-CM | POA: Diagnosis not present

## 2022-07-02 DIAGNOSIS — Z23 Encounter for immunization: Secondary | ICD-10-CM | POA: Diagnosis not present

## 2022-07-03 DIAGNOSIS — Z9181 History of falling: Secondary | ICD-10-CM | POA: Diagnosis not present

## 2022-07-03 DIAGNOSIS — R410 Disorientation, unspecified: Secondary | ICD-10-CM | POA: Diagnosis not present

## 2022-07-03 DIAGNOSIS — R400 Somnolence: Secondary | ICD-10-CM | POA: Diagnosis not present

## 2022-07-03 DIAGNOSIS — R262 Difficulty in walking, not elsewhere classified: Secondary | ICD-10-CM | POA: Diagnosis not present

## 2022-07-03 DIAGNOSIS — F39 Unspecified mood [affective] disorder: Secondary | ICD-10-CM | POA: Diagnosis not present

## 2022-07-04 DIAGNOSIS — I5032 Chronic diastolic (congestive) heart failure: Secondary | ICD-10-CM | POA: Diagnosis not present

## 2022-07-04 DIAGNOSIS — R300 Dysuria: Secondary | ICD-10-CM | POA: Diagnosis not present

## 2022-07-04 DIAGNOSIS — R410 Disorientation, unspecified: Secondary | ICD-10-CM | POA: Diagnosis not present

## 2022-07-04 DIAGNOSIS — S62102D Fracture of unspecified carpal bone, left wrist, subsequent encounter for fracture with routine healing: Secondary | ICD-10-CM | POA: Diagnosis not present

## 2022-07-04 DIAGNOSIS — N183 Chronic kidney disease, stage 3 unspecified: Secondary | ICD-10-CM | POA: Diagnosis not present

## 2022-07-04 DIAGNOSIS — Z9181 History of falling: Secondary | ICD-10-CM | POA: Diagnosis not present

## 2022-07-04 DIAGNOSIS — N3020 Other chronic cystitis without hematuria: Secondary | ICD-10-CM | POA: Diagnosis not present

## 2022-07-04 DIAGNOSIS — F1120 Opioid dependence, uncomplicated: Secondary | ICD-10-CM | POA: Diagnosis not present

## 2022-07-04 DIAGNOSIS — R262 Difficulty in walking, not elsewhere classified: Secondary | ICD-10-CM | POA: Diagnosis not present

## 2022-07-04 DIAGNOSIS — E871 Hypo-osmolality and hyponatremia: Secondary | ICD-10-CM | POA: Diagnosis not present

## 2022-07-05 DIAGNOSIS — N390 Urinary tract infection, site not specified: Secondary | ICD-10-CM | POA: Diagnosis not present

## 2022-07-05 DIAGNOSIS — I10 Essential (primary) hypertension: Secondary | ICD-10-CM | POA: Diagnosis not present

## 2022-07-06 DIAGNOSIS — Z9181 History of falling: Secondary | ICD-10-CM | POA: Diagnosis not present

## 2022-07-06 DIAGNOSIS — R262 Difficulty in walking, not elsewhere classified: Secondary | ICD-10-CM | POA: Diagnosis not present

## 2022-07-07 DIAGNOSIS — R262 Difficulty in walking, not elsewhere classified: Secondary | ICD-10-CM | POA: Diagnosis not present

## 2022-07-07 DIAGNOSIS — Z9181 History of falling: Secondary | ICD-10-CM | POA: Diagnosis not present

## 2022-07-08 DIAGNOSIS — N183 Chronic kidney disease, stage 3 unspecified: Secondary | ICD-10-CM | POA: Diagnosis not present

## 2022-07-08 DIAGNOSIS — F1120 Opioid dependence, uncomplicated: Secondary | ICD-10-CM | POA: Diagnosis not present

## 2022-07-08 DIAGNOSIS — E871 Hypo-osmolality and hyponatremia: Secondary | ICD-10-CM | POA: Diagnosis not present

## 2022-07-08 DIAGNOSIS — R410 Disorientation, unspecified: Secondary | ICD-10-CM | POA: Diagnosis not present

## 2022-07-08 DIAGNOSIS — D631 Anemia in chronic kidney disease: Secondary | ICD-10-CM | POA: Diagnosis not present

## 2022-07-08 DIAGNOSIS — I5032 Chronic diastolic (congestive) heart failure: Secondary | ICD-10-CM | POA: Diagnosis not present

## 2022-07-08 DIAGNOSIS — N390 Urinary tract infection, site not specified: Secondary | ICD-10-CM | POA: Diagnosis not present

## 2022-07-09 DIAGNOSIS — M2041 Other hammer toe(s) (acquired), right foot: Secondary | ICD-10-CM | POA: Diagnosis not present

## 2022-07-09 DIAGNOSIS — L603 Nail dystrophy: Secondary | ICD-10-CM | POA: Diagnosis not present

## 2022-07-09 DIAGNOSIS — B351 Tinea unguium: Secondary | ICD-10-CM | POA: Diagnosis not present

## 2022-07-09 DIAGNOSIS — M2042 Other hammer toe(s) (acquired), left foot: Secondary | ICD-10-CM | POA: Diagnosis not present

## 2022-07-09 DIAGNOSIS — I739 Peripheral vascular disease, unspecified: Secondary | ICD-10-CM | POA: Diagnosis not present

## 2022-07-14 DIAGNOSIS — F03B4 Unspecified dementia, moderate, with anxiety: Secondary | ICD-10-CM | POA: Diagnosis not present

## 2022-07-14 DIAGNOSIS — F411 Generalized anxiety disorder: Secondary | ICD-10-CM | POA: Diagnosis not present

## 2022-07-14 DIAGNOSIS — F32A Depression, unspecified: Secondary | ICD-10-CM | POA: Diagnosis not present

## 2022-07-22 DIAGNOSIS — F0390 Unspecified dementia without behavioral disturbance: Secondary | ICD-10-CM | POA: Diagnosis not present

## 2022-07-22 DIAGNOSIS — S62102D Fracture of unspecified carpal bone, left wrist, subsequent encounter for fracture with routine healing: Secondary | ICD-10-CM | POA: Diagnosis not present

## 2022-07-22 DIAGNOSIS — I129 Hypertensive chronic kidney disease with stage 1 through stage 4 chronic kidney disease, or unspecified chronic kidney disease: Secondary | ICD-10-CM | POA: Diagnosis not present

## 2022-07-22 DIAGNOSIS — I480 Paroxysmal atrial fibrillation: Secondary | ICD-10-CM | POA: Diagnosis not present

## 2022-07-22 DIAGNOSIS — Z09 Encounter for follow-up examination after completed treatment for conditions other than malignant neoplasm: Secondary | ICD-10-CM | POA: Diagnosis not present

## 2022-07-22 DIAGNOSIS — K5190 Ulcerative colitis, unspecified, without complications: Secondary | ICD-10-CM | POA: Diagnosis not present

## 2022-07-22 DIAGNOSIS — N3020 Other chronic cystitis without hematuria: Secondary | ICD-10-CM | POA: Diagnosis not present

## 2022-07-22 DIAGNOSIS — Z87440 Personal history of urinary (tract) infections: Secondary | ICD-10-CM | POA: Diagnosis not present

## 2022-07-22 DIAGNOSIS — N183 Chronic kidney disease, stage 3 unspecified: Secondary | ICD-10-CM | POA: Diagnosis not present

## 2022-07-25 DIAGNOSIS — F411 Generalized anxiety disorder: Secondary | ICD-10-CM | POA: Diagnosis not present

## 2022-07-31 DIAGNOSIS — S52542D Smith's fracture of left radius, subsequent encounter for closed fracture with routine healing: Secondary | ICD-10-CM | POA: Diagnosis not present

## 2022-08-04 DIAGNOSIS — F32A Depression, unspecified: Secondary | ICD-10-CM | POA: Diagnosis not present

## 2022-08-04 DIAGNOSIS — F03B4 Unspecified dementia, moderate, with anxiety: Secondary | ICD-10-CM | POA: Diagnosis not present

## 2022-08-04 DIAGNOSIS — F411 Generalized anxiety disorder: Secondary | ICD-10-CM | POA: Diagnosis not present

## 2022-08-05 DIAGNOSIS — Z23 Encounter for immunization: Secondary | ICD-10-CM | POA: Diagnosis not present

## 2022-08-05 DIAGNOSIS — Z7185 Encounter for immunization safety counseling: Secondary | ICD-10-CM | POA: Diagnosis not present

## 2022-08-14 DIAGNOSIS — S52542D Smith's fracture of left radius, subsequent encounter for closed fracture with routine healing: Secondary | ICD-10-CM | POA: Diagnosis not present

## 2022-08-14 DIAGNOSIS — L89893 Pressure ulcer of other site, stage 3: Secondary | ICD-10-CM | POA: Diagnosis not present

## 2022-09-02 DIAGNOSIS — F411 Generalized anxiety disorder: Secondary | ICD-10-CM | POA: Diagnosis not present

## 2022-09-08 ENCOUNTER — Emergency Department (HOSPITAL_BASED_OUTPATIENT_CLINIC_OR_DEPARTMENT_OTHER)
Admission: EM | Admit: 2022-09-08 | Discharge: 2022-09-09 | Disposition: A | Discharge status: Home / Self Care | Payer: Medicare PPO | Attending: Emergency Medicine | Admitting: Emergency Medicine

## 2022-09-08 ENCOUNTER — Encounter (HOSPITAL_BASED_OUTPATIENT_CLINIC_OR_DEPARTMENT_OTHER): Payer: Self-pay

## 2022-09-08 ENCOUNTER — Other Ambulatory Visit: Payer: Self-pay

## 2022-09-08 ENCOUNTER — Emergency Department (HOSPITAL_BASED_OUTPATIENT_CLINIC_OR_DEPARTMENT_OTHER): Payer: Medicare PPO | Admitting: Radiology

## 2022-09-08 ENCOUNTER — Emergency Department (HOSPITAL_BASED_OUTPATIENT_CLINIC_OR_DEPARTMENT_OTHER): Payer: Medicare PPO

## 2022-09-08 DIAGNOSIS — S32302A Unspecified fracture of left ilium, initial encounter for closed fracture: Secondary | ICD-10-CM

## 2022-09-08 DIAGNOSIS — S0990XA Unspecified injury of head, initial encounter: Secondary | ICD-10-CM | POA: Diagnosis not present

## 2022-09-08 DIAGNOSIS — W07XXXA Fall from chair, initial encounter: Secondary | ICD-10-CM | POA: Insufficient documentation

## 2022-09-08 DIAGNOSIS — W19XXXA Unspecified fall, initial encounter: Secondary | ICD-10-CM | POA: Diagnosis not present

## 2022-09-08 DIAGNOSIS — F0390 Unspecified dementia without behavioral disturbance: Secondary | ICD-10-CM | POA: Diagnosis not present

## 2022-09-08 DIAGNOSIS — S0081XA Abrasion of other part of head, initial encounter: Secondary | ICD-10-CM | POA: Insufficient documentation

## 2022-09-08 DIAGNOSIS — Y92129 Unspecified place in nursing home as the place of occurrence of the external cause: Secondary | ICD-10-CM | POA: Diagnosis not present

## 2022-09-08 DIAGNOSIS — S3210XA Unspecified fracture of sacrum, initial encounter for closed fracture: Secondary | ICD-10-CM | POA: Diagnosis not present

## 2022-09-08 DIAGNOSIS — M25552 Pain in left hip: Secondary | ICD-10-CM | POA: Diagnosis not present

## 2022-09-08 DIAGNOSIS — S32502A Unspecified fracture of left pubis, initial encounter for closed fracture: Secondary | ICD-10-CM | POA: Diagnosis not present

## 2022-09-08 DIAGNOSIS — S32592A Other specified fracture of left pubis, initial encounter for closed fracture: Secondary | ICD-10-CM

## 2022-09-08 NOTE — Discharge Instructions (Addendum)
You have several fractures of your pelvis on the left.  These do not require surgery and you may bear weight as tolerated.  If you are not able to bear weight you should use a wheelchair for mobility until pain has improved.

## 2022-09-08 NOTE — ED Triage Notes (Signed)
Pt presents to the ED with [facility] for fall. Pt resides at [facility]. Unwitnessed fall, but staff said they heard it and went in the room immediately. Staff denies LOC. Small lac noted to posterior scalp, bleeding controlled. No blood thinners. Pt has hx of dementia and is A&Ox3 at baseline. Pt complaining of left hip pain. Hx of chronic hip pain.

## 2022-09-08 NOTE — ED Provider Notes (Signed)
[place] EMERGENCY DEPT Provider Note   CSN: [id number] Arrival date & time: 09/08/22  2139     History Chief Complaint  Patient presents with   Fall    HPI Autumn Dean is a 88 y.o. female presenting for ground-level fall.  Patient has a history of dementia.  She lives at a skilled nursing facility.  Per the EMS team, patient fell backwards out of a chair.  Hit the back of her head but does not have a laceration.  She has abrasion there.  She is complaining left hip pain.  Patient is disoriented at this time which is her baseline per her facility.  Patient's recorded medical, surgical, social, medication list and allergies were reviewed in the Snapshot window as part of the initial history.   Review of Systems   Review of Systems  Constitutional:  Negative for chills and fever.  HENT:  Negative for ear pain and sore throat.   Eyes:  Negative for pain and visual disturbance.  Respiratory:  Negative for cough and shortness of breath.   Cardiovascular:  Negative for chest pain and palpitations.  Gastrointestinal:  Negative for abdominal pain and vomiting.  Genitourinary:  Negative for dysuria and hematuria.  Musculoskeletal:  Negative for arthralgias and back pain.  Skin:  Negative for color change and rash.  Neurological:  Negative for seizures and syncope.  Psychiatric/Behavioral:  Positive for confusion.   All other systems reviewed and are negative.   Physical Exam Updated Vital Signs BP 109/62 (BP Location: Left Arm)   Pulse 97   Temp 97.8 F (36.6 C)   Resp 18   SpO2 93%  Physical Exam Vitals and nursing note reviewed.  Constitutional:      General: She is not in acute distress.    Appearance: She is well-developed.  HENT:     Head: Normocephalic and atraumatic.  Eyes:     Conjunctiva/sclera: Conjunctivae normal.  Cardiovascular:     Rate and Rhythm: Normal rate and regular rhythm.     Heart sounds: No murmur heard. Pulmonary:      Effort: Pulmonary effort is normal. No respiratory distress.     Breath sounds: Normal breath sounds.  Abdominal:     General: There is no distension.     Palpations: Abdomen is soft.     Tenderness: There is no abdominal tenderness. There is no right CVA tenderness or left CVA tenderness.  Musculoskeletal:        General: Tenderness (Tenderness with motion of the left lower extremity.  Patient winces in pain.  Full mobility of the right lower extremity, bilateral upper extremities.  Full range of motion of the neck.) present. No swelling. Normal range of motion.     Cervical back: Neck supple.  Skin:    General: Skin is warm and dry.  Neurological:     General: No focal deficit present.     Mental Status: She is alert and oriented to person, place, and time. Mental status is at baseline.     Cranial Nerves: No cranial nerve deficit.      ED Course/ Medical Decision Making/ A&P    Procedures Procedures   Medications Ordered in ED Medications - No data to display  Medical Decision Making:    Autumn Dean is a 88 y.o. female who presented to the ED today with a moderate mechanisma trauma, detailed above.    Handoff received from EMS.  Patient's presentation is complicated by their history of dementia.  Patient placed on continuous vitals and telemetry monitoring while in ED which was reviewed periodically.   Given this mechanism of trauma, a full physical exam was performed. Notably, patient was hemodynamically stable in no acute distress.   Reviewed and confirmed nursing documentation for past medical history, family history, social history.    Initial Assessment/Plan:   This is a patient presenting with a moderate mechanism trauma.  As such, I have considered intracranial injuries including intracranial hemorrhage, intrathoracic injuries including blunt myocardial or blunt lung injury, blunt abdominal injuries including aortic dissection, bladder injury, spleen injury,  liver injury and I have considered orthopedic injuries including extremity or spinal injury.  With the patient's presentation of moderate mechanism trauma but an otherwise reassuring exam, patient warrants targeted evaluation for potential traumatic injuries. Will proceed with targeted evaluation for potential injuries. Will proceed with XR LLE, CT H. Objective evaluation pending at time of handoff.    Clinical Impression:  1. Fall, initial encounter      Data Unavailable   Final Clinical Impression(s) / ED Diagnoses Final diagnoses:  Fall, initial encounter    Rx / DC Orders ED Discharge Orders     None         Tretha Sciara, MD 09/08/22 2307

## 2022-09-09 ENCOUNTER — Emergency Department (HOSPITAL_BASED_OUTPATIENT_CLINIC_OR_DEPARTMENT_OTHER): Payer: Medicare PPO

## 2022-09-09 DIAGNOSIS — K5190 Ulcerative colitis, unspecified, without complications: Secondary | ICD-10-CM | POA: Diagnosis not present

## 2022-09-09 DIAGNOSIS — Z9181 History of falling: Secondary | ICD-10-CM | POA: Diagnosis not present

## 2022-09-09 DIAGNOSIS — E871 Hypo-osmolality and hyponatremia: Secondary | ICD-10-CM | POA: Diagnosis not present

## 2022-09-09 DIAGNOSIS — S32502A Unspecified fracture of left pubis, initial encounter for closed fracture: Secondary | ICD-10-CM | POA: Diagnosis not present

## 2022-09-09 DIAGNOSIS — Z7401 Bed confinement status: Secondary | ICD-10-CM | POA: Diagnosis not present

## 2022-09-09 DIAGNOSIS — R262 Difficulty in walking, not elsewhere classified: Secondary | ICD-10-CM | POA: Diagnosis not present

## 2022-09-09 DIAGNOSIS — I5032 Chronic diastolic (congestive) heart failure: Secondary | ICD-10-CM | POA: Diagnosis not present

## 2022-09-09 DIAGNOSIS — F39 Unspecified mood [affective] disorder: Secondary | ICD-10-CM | POA: Diagnosis not present

## 2022-09-09 DIAGNOSIS — I480 Paroxysmal atrial fibrillation: Secondary | ICD-10-CM | POA: Diagnosis not present

## 2022-09-09 DIAGNOSIS — W19XXXA Unspecified fall, initial encounter: Secondary | ICD-10-CM | POA: Diagnosis not present

## 2022-09-09 DIAGNOSIS — S32110A Nondisplaced Zone I fracture of sacrum, initial encounter for closed fracture: Secondary | ICD-10-CM | POA: Diagnosis not present

## 2022-09-09 DIAGNOSIS — I110 Hypertensive heart disease with heart failure: Secondary | ICD-10-CM | POA: Diagnosis not present

## 2022-09-09 DIAGNOSIS — R531 Weakness: Secondary | ICD-10-CM | POA: Diagnosis not present

## 2022-09-09 DIAGNOSIS — N3020 Other chronic cystitis without hematuria: Secondary | ICD-10-CM | POA: Diagnosis not present

## 2022-09-09 MED ORDER — TRAMADOL HCL 50 MG PO TABS
50.0000 mg | ORAL_TABLET | Freq: Once | ORAL | Status: AC
  Administered 2022-09-09: 50 mg via ORAL
  Filled 2022-09-09: qty 1

## 2022-09-09 NOTE — ED Provider Notes (Signed)
Nursing notes and vitals signs, including pulse oximetry, reviewed.  Summary of this visit's results, reviewed by myself:  EKG:  EKG Interpretation  Date/Time:    Ventricular Rate:    PR Interval:    QRS Duration:   QT Interval:    QTC Calculation:   R Axis:     Text Interpretation:          Labs:  No results found for this or any previous visit (from the past 24 hour(s)).  Imaging Studies: CT PELVIS WO CONTRAST  Result Date: 09/09/2022 CLINICAL DATA:  Status post fall. EXAM: CT PELVIS WITHOUT CONTRAST TECHNIQUE: Multidetector CT imaging of the pelvis was performed following the standard protocol without intravenous contrast. RADIATION DOSE REDUCTION: This exam was performed according to the departmental dose-optimization program which includes automated exposure control, adjustment of the mA and/or kV according to patient size and/or use of iterative reconstruction technique. COMPARISON:  February 22, 2021 FINDINGS: Urinary Tract:  No abnormality visualized. Bowel: A large amount of stool is seen within the distal sigmoid colon. No dilated bowel loops are identified. Vascular/Lymphatic: There is marked severity calcification of the visualized portion of the abdominal aorta and bilateral common iliac arteries, without evidence of aneurysmal dilatation. Reproductive: A 3.4 cm x 2.9 cm x 2.5 cm coarse calcification is seen within the dorsal fundus of a retroverted uterus. The bilateral adnexa are unremarkable. Other:  None. Musculoskeletal: An acute, comminuted nondisplaced fracture deformity is seen involving the inferior aspect of the left iliac bone (axial CT images 39 through 61, CT series 3). A chronic fracture deformity of the left inferior pubic ramus is also seen. A small superimposed acute left inferior pubic ramus fracture is also noted (axial CT images 106 through 111, CT series 3). Acute nondisplaced fracture is seen along the anterior aspect of the sacrum at S4 (sagittal reformatted  images 84 through 96, CT series 7). Multilevel degenerative changes are seen within the visualized portion of the lower lumbar spine. IMPRESSION: 1. Acute, comminuted nondisplaced fracture deformity of the inferior aspect of the left iliac bone. 2. Acute nondisplaced fracture of the anterior aspect of the sacrum at S4. 3. Acute and chronic fracture deformities of the left inferior pubic ramus. 4. Aortic atherosclerosis. Aortic Atherosclerosis (ICD10-I70.0). Electronically Signed   By: Virgina Norfolk M.D.   On: 09/09/2022 01:58   DG Pelvis 1-2 Views  Result Date: 09/09/2022 CLINICAL DATA:  Status post fall. EXAM: PELVIS - 1-2 VIEW COMPARISON:  February 22, 2021 FINDINGS: Fracture deformities of indeterminate age are seen involving the left superior and left inferior pubic rami. These are not visualized on the prior study. There is no evidence of dislocation. A stable, likely benign, 11 mm sclerotic focus is seen within the inferolateral aspect of the left iliac bone. Moderate severity degenerative changes are seen involving both hips, in the form of joint space narrowing and acetabular sclerosis. IMPRESSION: 1. Fracture deformities of indeterminate age involving the left superior and left inferior pubic rami. 2. Moderate severity degenerative changes involving both hips. Electronically Signed   By: Virgina Norfolk M.D.   On: 09/09/2022 00:21   DG FEMUR MIN 2 VIEWS LEFT  Result Date: 09/09/2022 CLINICAL DATA:  Status post fall. EXAM: LEFT FEMUR 2 VIEWS COMPARISON:  February 23, 2021 FINDINGS: The left femur is intact. A fracture deformity of indeterminate age is seen involving the left inferior pubic ramus. This represents a new finding when compared to the prior study. There is no evidence of dislocation. Moderate  severity vascular calcification is seen. IMPRESSION: Fracture deformity of indeterminate age involving the left inferior pubic ramus. CT correlation is recommended. Electronically Signed   By:  Virgina Norfolk M.D.   On: 09/09/2022 00:17   CT HEAD WO CONTRAST (5MM)  Result Date: 09/09/2022 CLINICAL DATA:  Status post fall. EXAM: CT HEAD WITHOUT CONTRAST TECHNIQUE: Contiguous axial images were obtained from the base of the skull through the vertex without intravenous contrast. RADIATION DOSE REDUCTION: This exam was performed according to the departmental dose-optimization program which includes automated exposure control, adjustment of the mA and/or kV according to patient size and/or use of iterative reconstruction technique. COMPARISON:  None Available. FINDINGS: Brain: There is mild cerebral atrophy with widening of the extra-axial spaces and ventricular dilatation. There are areas of decreased attenuation within the white matter tracts of the supratentorial brain, consistent with microvascular disease changes. Vascular: No hyperdense vessel or unexpected calcification. Skull: Normal. Negative for fracture or focal lesion. Sinuses/Orbits: A small amount of fluid is seen within the right mastoid air cells. Other: None. IMPRESSION: 1. No acute intracranial abnormality. 2. Generalized cerebral atrophy and microvascular disease changes of the supratentorial brain. Electronically Signed   By: Virgina Norfolk M.D.   On: 09/09/2022 00:13    2:16 AM Discussed with Dr. Lucia Gaskins of orthopedics.  He states that these injuries to the pelvis can be treated with weightbearing as tolerated for surgical intervention.   Jamaurion Slemmer, Jenny Reichmann, MD 09/09/22 [phone]

## 2022-09-10 DIAGNOSIS — M6258 Muscle wasting and atrophy, not elsewhere classified, other site: Secondary | ICD-10-CM | POA: Diagnosis not present

## 2022-09-10 DIAGNOSIS — R2689 Other abnormalities of gait and mobility: Secondary | ICD-10-CM | POA: Diagnosis not present

## 2022-09-10 DIAGNOSIS — M6281 Muscle weakness (generalized): Secondary | ICD-10-CM | POA: Diagnosis not present

## 2022-09-11 DIAGNOSIS — M6281 Muscle weakness (generalized): Secondary | ICD-10-CM | POA: Diagnosis not present

## 2022-09-11 DIAGNOSIS — M6258 Muscle wasting and atrophy, not elsewhere classified, other site: Secondary | ICD-10-CM | POA: Diagnosis not present

## 2022-09-11 DIAGNOSIS — R2689 Other abnormalities of gait and mobility: Secondary | ICD-10-CM | POA: Diagnosis not present

## 2022-09-11 IMAGING — CT CT PELVIS W/O CM
2 of 3 series · 16 of 46 positions shown, 18 images · non-contrast
Comparison: CT abdomen pelvis 05/11/2015, x-ray pelvis 02/22/2021

CLINICAL DATA: Pelvic trauma fall

EXAM:
CT PELVIS WITHOUT CONTRAST
TECHNIQUE: Multidetector CT imaging of the pelvis was performed following the
standard protocol without intravenous contrast.

[Series 5: 3 axial soft · axial · 0.71mm/px · z∈[-342,-118]mm · 13 of 130 slices shown, 15 images]
[im 9/130  soft-tissue]
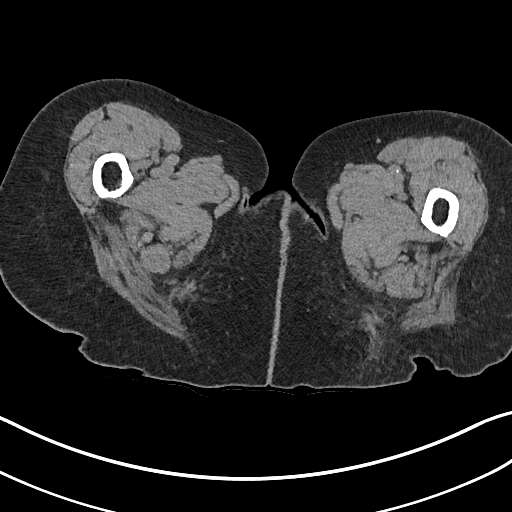
[im 9/130  bone]
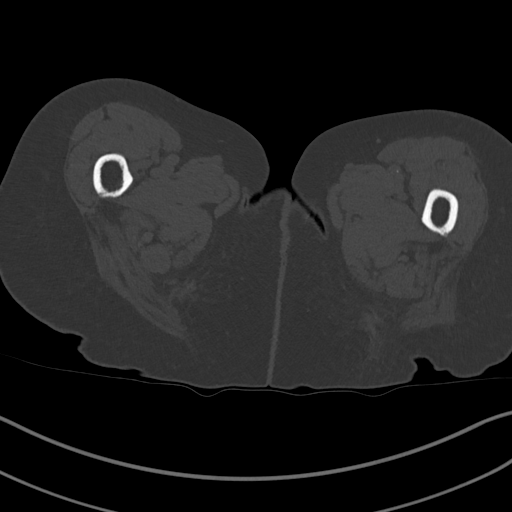
[im 17/130  soft-tissue]
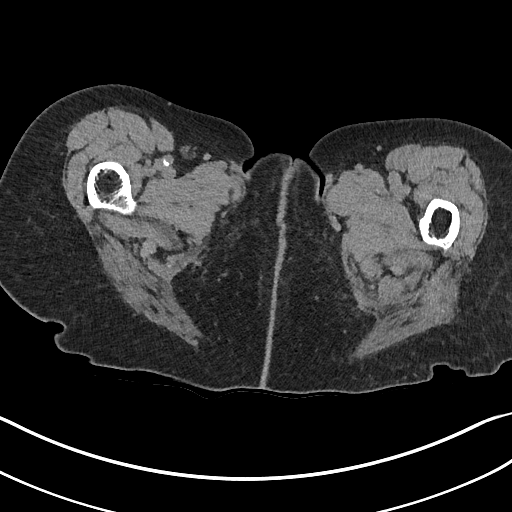
[im 25/130  soft-tissue]
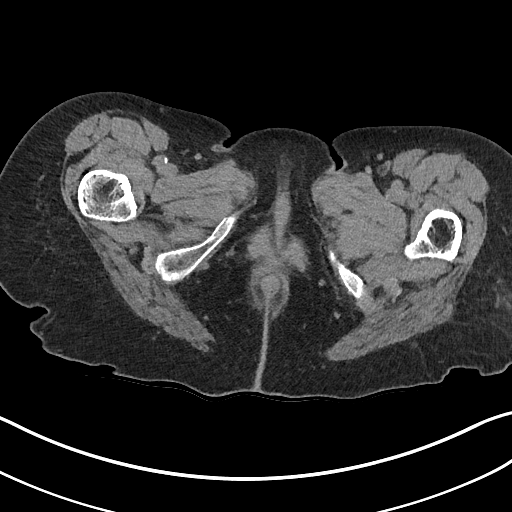
[im 38/130  soft-tissue]
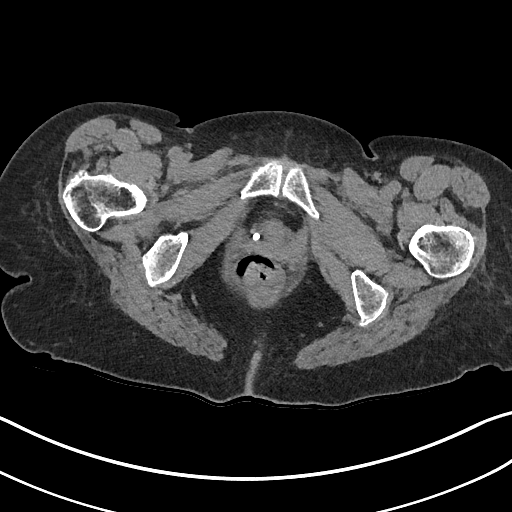
[im 46/130  soft-tissue]
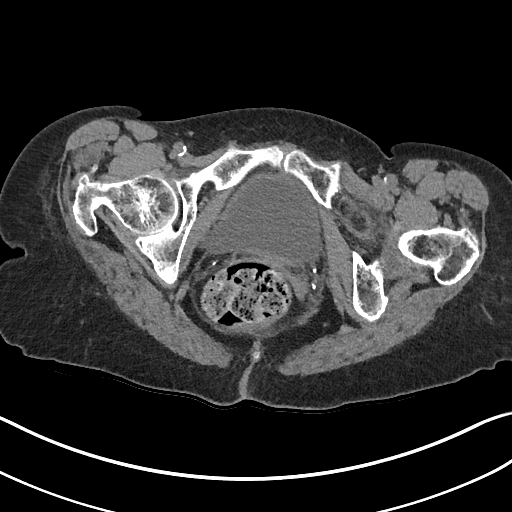
[im 55/130  soft-tissue]
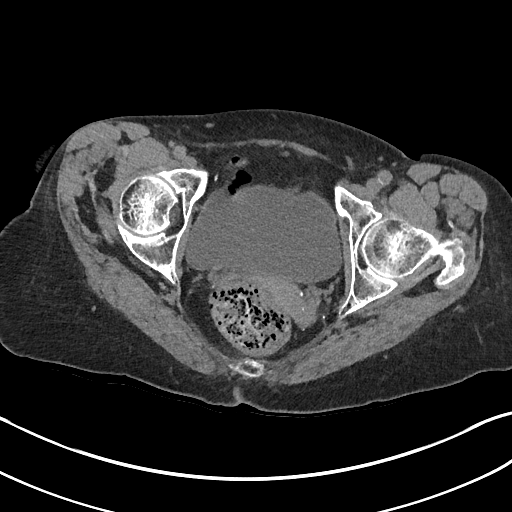
[im 67/130  soft-tissue]
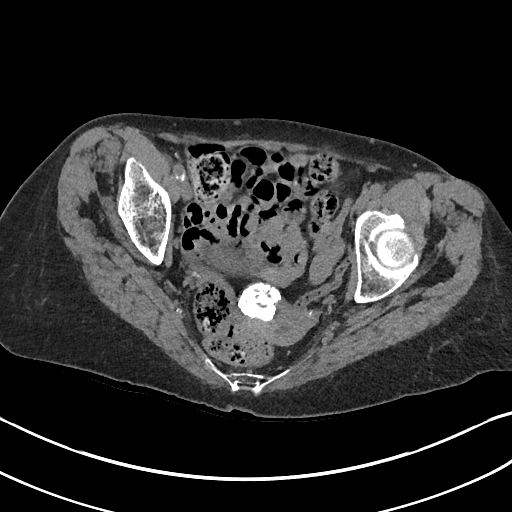
[im 75/130  soft-tissue]
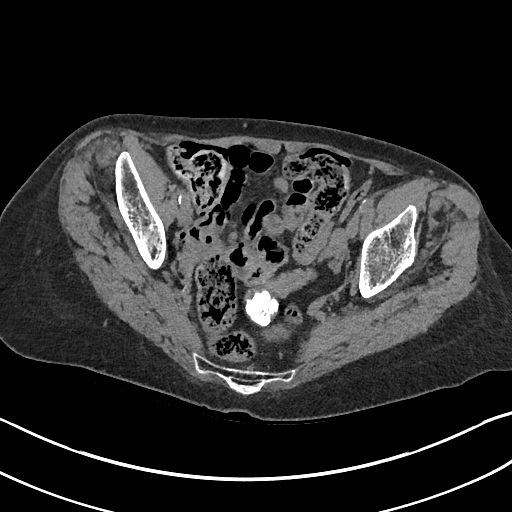
[im 84/130  soft-tissue]
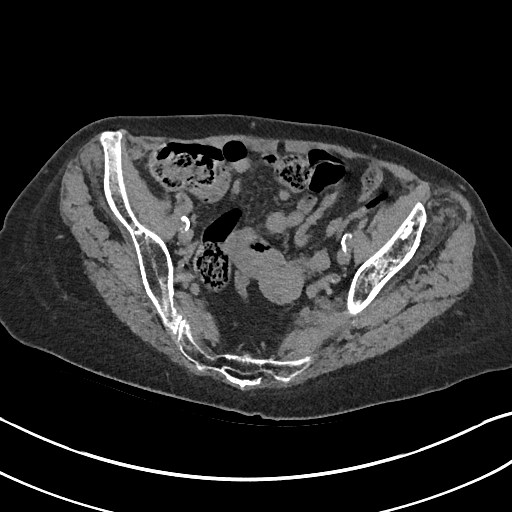
[im 84/130  bone]
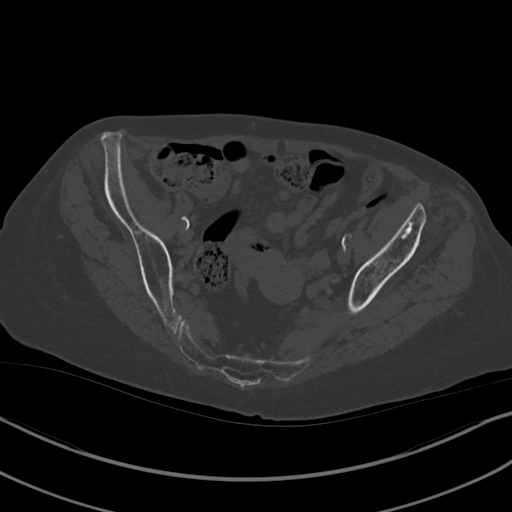
[im 92/130  soft-tissue]
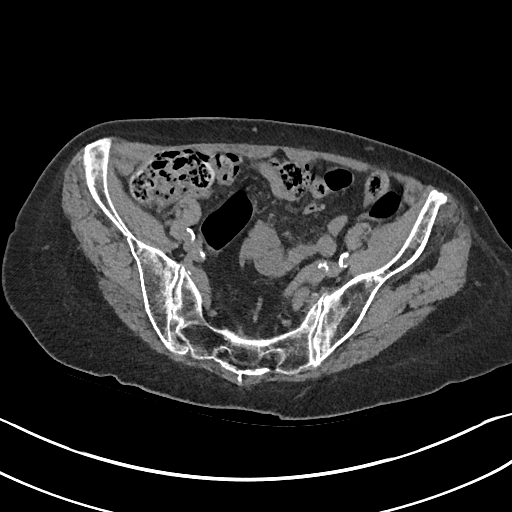
[im 105/130  soft-tissue]
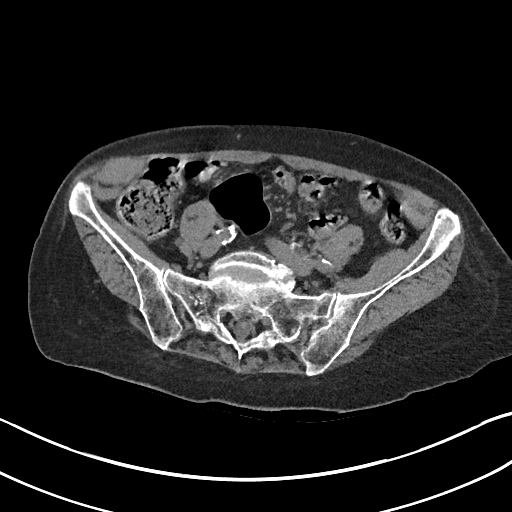
[im 113/130  soft-tissue]
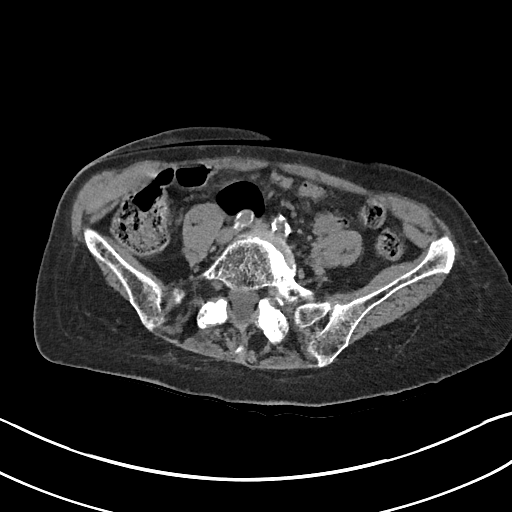
[im 121/130  soft-tissue]
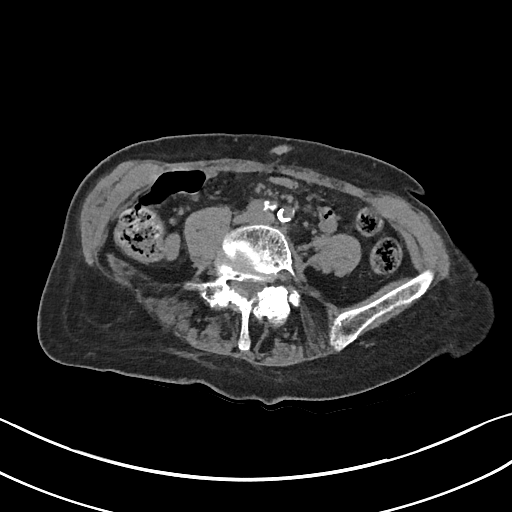

[Series 8: coronal st · coronal · 0.55mm/px · 3 of 122 slices shown]
[im 41/122  soft-tissue]
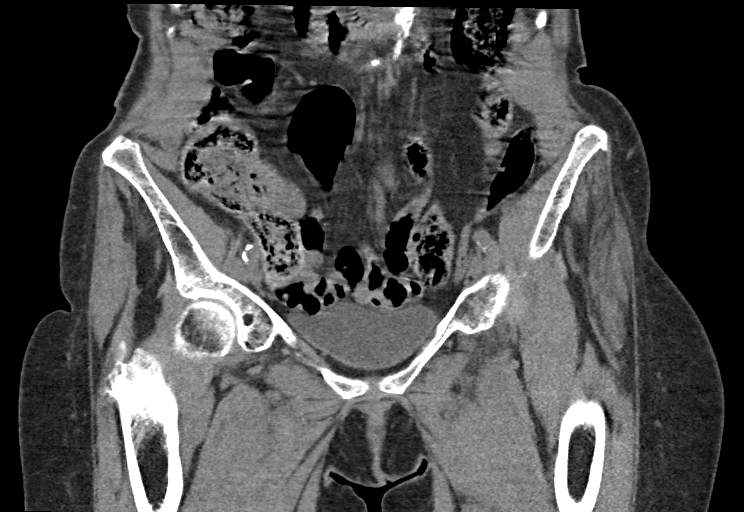
[im 54/122  soft-tissue]
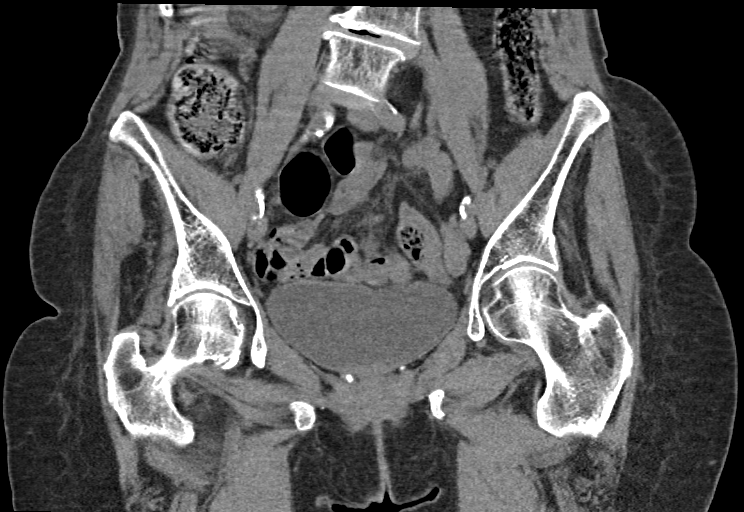
[im 68/122  soft-tissue]
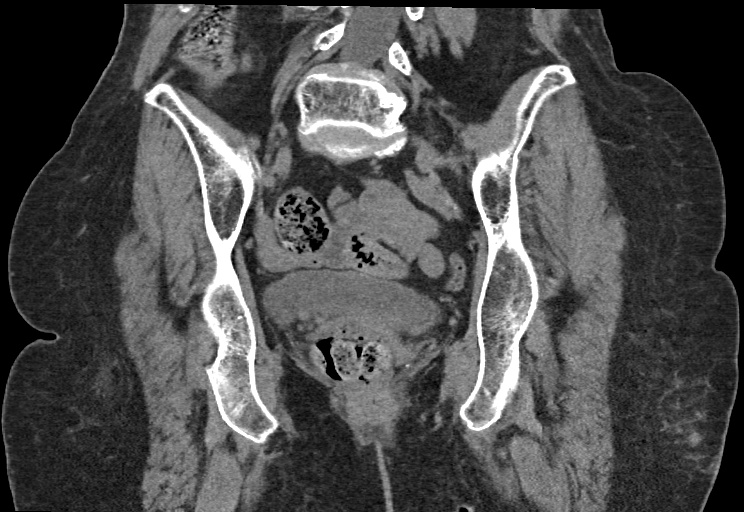

[16 of 46 positions shown; findings below may reference images not displayed]

FINDINGS: Urinary Tract:  No abnormality visualized.

Bowel:  Unremarkable visualized pelvic bowel loops.

Vascular/Lymphatic: No abdominal aorta or iliac aneurysm. At least
moderate atherosclerotic plaque of the aorta and its branches. No
abdominal, pelvic, or inguinal lymphadenopathy.

Reproductive: Coarsely calcified 3.5 cm lesion within the uterus
likely represents a degenerative fibroid. Otherwise the uterus and
bilateral adnexal regions are unremarkable.

Other: No intraperitoneal free fluid. No intraperitoneal free gas.
No organized fluid collection.

Musculoskeletal: Slightly worsened grade 1 anterolisthesis of L4 on
L5 and stable grade 1 anterolisthesis of L5 on S1. similar-appearing
cortical irregularity of the right inferior pubic ramus likely
representing an old healed fracture. No acute displaced fracture or
diastasis of the pelvis. No acute displaced fracture or dislocation
of the hips. No acute displaced fracture of the visualized lower
lumbar spine and sacrum. Facet arthropathy and osteophyte formation
noted of the lower lumbar spine. Intervertebral disc space vacuum
phenomenon also noted.
IMPRESSION: 1. No acute displaced fracture or diastasis of the pelvis.
2. No acute displaced fracture or dislocation of the hips.
3. No acute displaced fracture or traumatic listhesis of the
visualized lower lumbar spine and sacrum.
4. Other imaging findings of potential clinical significance:
Degenerative uterine fibroid. Aortic Atherosclerosis ([id number]).

## 2022-09-11 IMAGING — DX DG LUMBAR SPINE COMPLETE 4+V
5 series · 5 of 5 positions shown · non-contrast
Comparison: Reformats from abdominopelvic CT 05/11/2015

CLINICAL DATA: Fall today with lumbosacral back pain.

EXAM:
LUMBAR SPINE - COMPLETE 4+ VIEW

[l-spine ap]
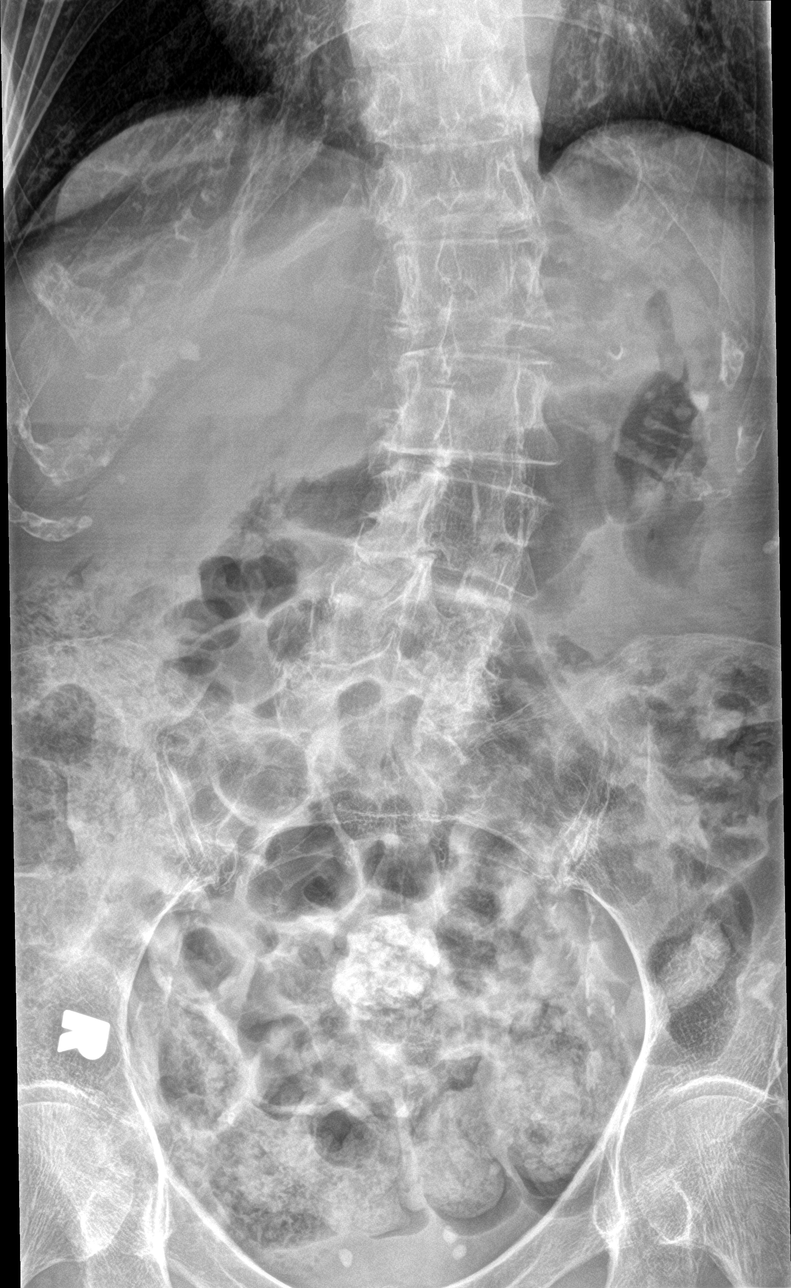

[l-spine obl (1 of 2)]
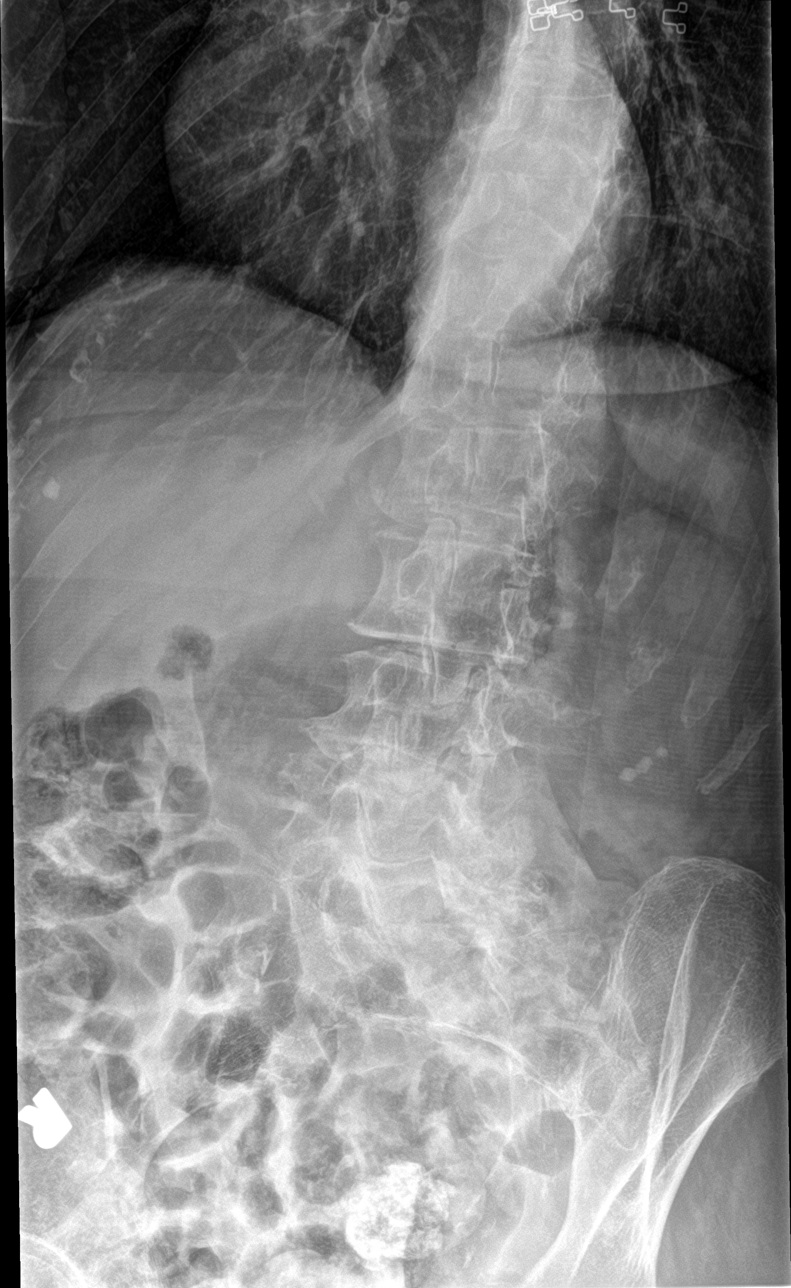

[l-spine obl (2 of 2)]
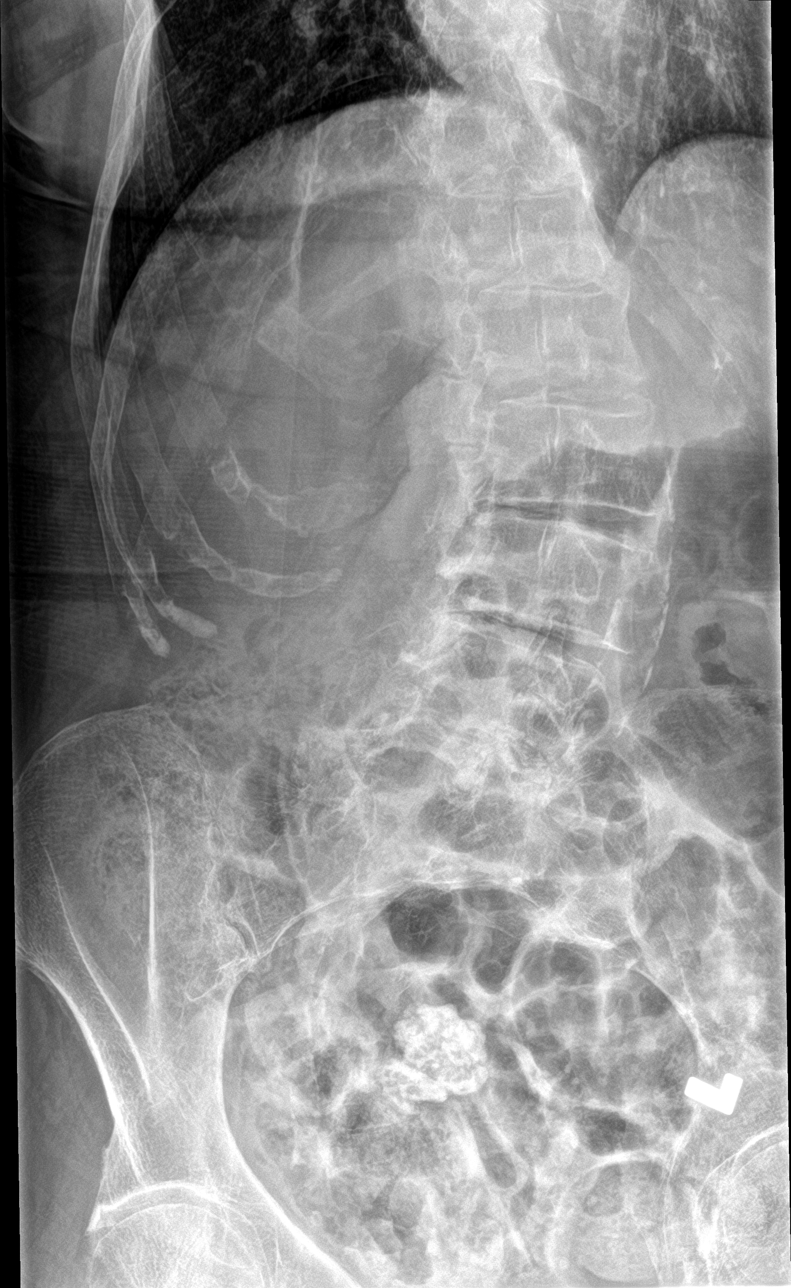

[l-spine lat]
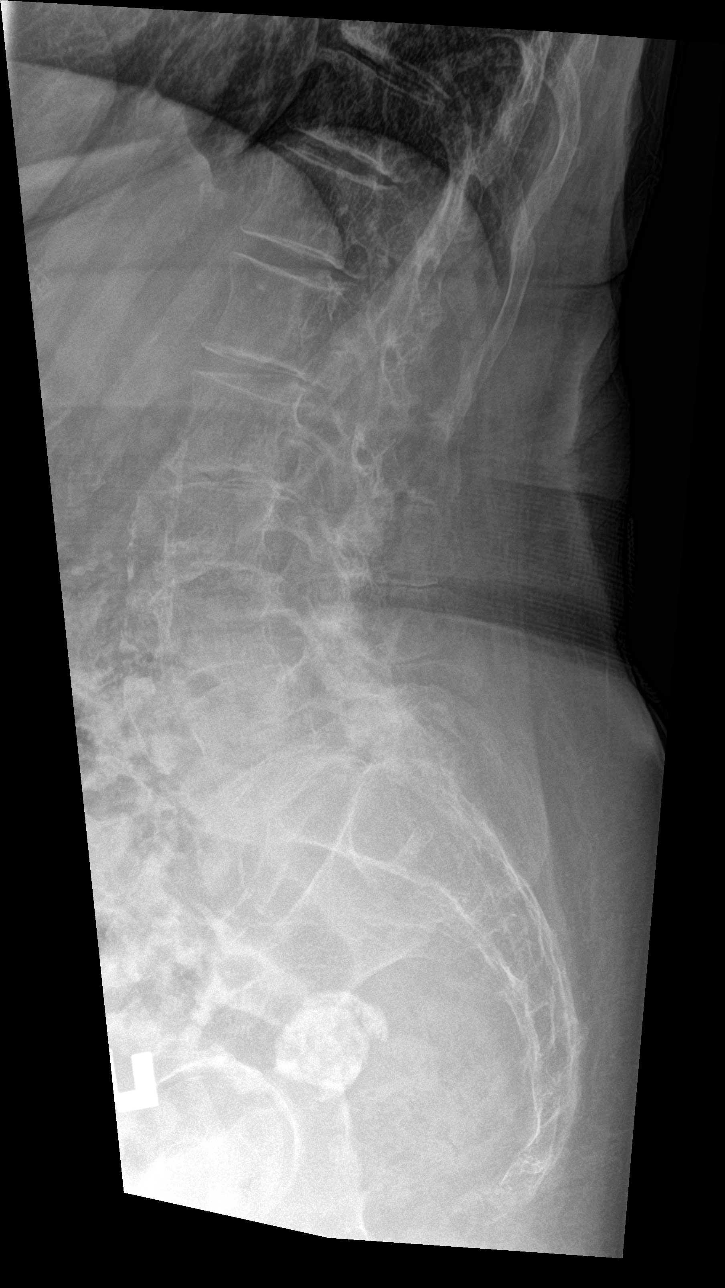

[l-spine spot]
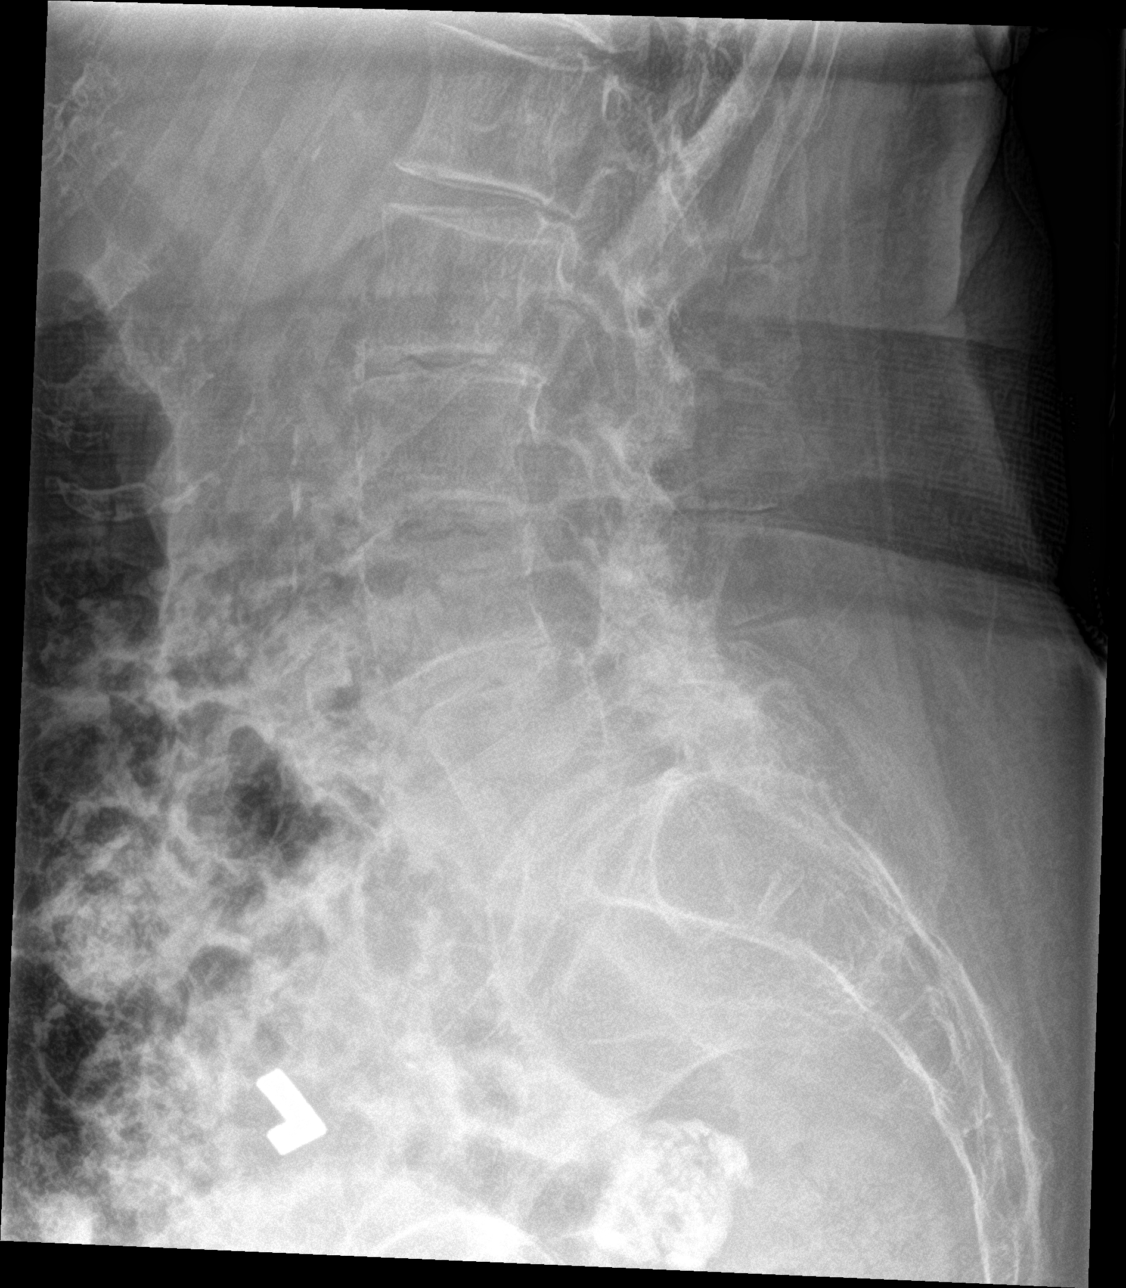

[5 of 5 positions shown; findings below may reference images not displayed]

FINDINGS: Broad-based levo scoliotic curvature of the lumbar spine. Vertebral
body heights are normal. No evidence of acute fracture. Mild diffuse
degenerative disc disease. Moderate lower lumbar facet hypertrophy.
The bones are diffusely under mineralized. Sacroiliac joints are
congruent with left greater than right degenerative change. Aortic
atherosclerosis. Calcified uterine fibroids in the pelvis.
IMPRESSION: 1. No evidence of acute fracture of the lumbar spine.
2. Scoliosis with multilevel degenerative change.

## 2022-09-12 DIAGNOSIS — M6258 Muscle wasting and atrophy, not elsewhere classified, other site: Secondary | ICD-10-CM | POA: Diagnosis not present

## 2022-09-12 DIAGNOSIS — R2689 Other abnormalities of gait and mobility: Secondary | ICD-10-CM | POA: Diagnosis not present

## 2022-09-12 DIAGNOSIS — M6281 Muscle weakness (generalized): Secondary | ICD-10-CM | POA: Diagnosis not present

## 2022-09-13 ENCOUNTER — Encounter (INDEPENDENT_AMBULATORY_CARE_PROVIDER_SITE_OTHER): Payer: Self-pay | Admitting: Gastroenterology

## 2022-09-15 DIAGNOSIS — M6258 Muscle wasting and atrophy, not elsewhere classified, other site: Secondary | ICD-10-CM | POA: Diagnosis not present

## 2022-09-15 DIAGNOSIS — R2689 Other abnormalities of gait and mobility: Secondary | ICD-10-CM | POA: Diagnosis not present

## 2022-09-15 DIAGNOSIS — N390 Urinary tract infection, site not specified: Secondary | ICD-10-CM | POA: Diagnosis not present

## 2022-09-15 DIAGNOSIS — M6281 Muscle weakness (generalized): Secondary | ICD-10-CM | POA: Diagnosis not present

## 2022-09-15 DIAGNOSIS — S32512A Fracture of superior rim of left pubis, initial encounter for closed fracture: Secondary | ICD-10-CM | POA: Diagnosis not present

## 2022-09-15 DIAGNOSIS — M25552 Pain in left hip: Secondary | ICD-10-CM | POA: Diagnosis not present

## 2022-09-16 DIAGNOSIS — M6258 Muscle wasting and atrophy, not elsewhere classified, other site: Secondary | ICD-10-CM | POA: Diagnosis not present

## 2022-09-16 DIAGNOSIS — F411 Generalized anxiety disorder: Secondary | ICD-10-CM | POA: Diagnosis not present

## 2022-09-16 DIAGNOSIS — N390 Urinary tract infection, site not specified: Secondary | ICD-10-CM | POA: Diagnosis not present

## 2022-09-16 DIAGNOSIS — M6281 Muscle weakness (generalized): Secondary | ICD-10-CM | POA: Diagnosis not present

## 2022-09-16 DIAGNOSIS — R2689 Other abnormalities of gait and mobility: Secondary | ICD-10-CM | POA: Diagnosis not present

## 2022-09-17 DIAGNOSIS — M6258 Muscle wasting and atrophy, not elsewhere classified, other site: Secondary | ICD-10-CM | POA: Diagnosis not present

## 2022-09-17 DIAGNOSIS — Z1612 Extended spectrum beta lactamase (ESBL) resistance: Secondary | ICD-10-CM | POA: Diagnosis not present

## 2022-09-17 DIAGNOSIS — Z66 Do not resuscitate: Secondary | ICD-10-CM | POA: Diagnosis not present

## 2022-09-17 DIAGNOSIS — A498 Other bacterial infections of unspecified site: Secondary | ICD-10-CM | POA: Diagnosis not present

## 2022-09-17 DIAGNOSIS — Z7189 Other specified counseling: Secondary | ICD-10-CM | POA: Diagnosis not present

## 2022-09-17 DIAGNOSIS — R400 Somnolence: Secondary | ICD-10-CM | POA: Diagnosis not present

## 2022-09-17 DIAGNOSIS — M6281 Muscle weakness (generalized): Secondary | ICD-10-CM | POA: Diagnosis not present

## 2022-09-17 DIAGNOSIS — R2689 Other abnormalities of gait and mobility: Secondary | ICD-10-CM | POA: Diagnosis not present

## 2022-09-17 DIAGNOSIS — B961 Klebsiella pneumoniae [K. pneumoniae] as the cause of diseases classified elsewhere: Secondary | ICD-10-CM | POA: Diagnosis not present

## 2022-09-18 DIAGNOSIS — M6281 Muscle weakness (generalized): Secondary | ICD-10-CM | POA: Diagnosis not present

## 2022-09-18 DIAGNOSIS — M6258 Muscle wasting and atrophy, not elsewhere classified, other site: Secondary | ICD-10-CM | POA: Diagnosis not present

## 2022-09-18 DIAGNOSIS — R2689 Other abnormalities of gait and mobility: Secondary | ICD-10-CM | POA: Diagnosis not present

## 2022-09-19 DIAGNOSIS — M6258 Muscle wasting and atrophy, not elsewhere classified, other site: Secondary | ICD-10-CM | POA: Diagnosis not present

## 2022-09-19 DIAGNOSIS — M6281 Muscle weakness (generalized): Secondary | ICD-10-CM | POA: Diagnosis not present

## 2022-09-19 DIAGNOSIS — R2689 Other abnormalities of gait and mobility: Secondary | ICD-10-CM | POA: Diagnosis not present

## 2022-09-22 ENCOUNTER — Ambulatory Visit: Payer: Self-pay | Admitting: *Deleted

## 2022-09-22 ENCOUNTER — Encounter: Payer: Self-pay | Admitting: *Deleted

## 2022-09-22 DIAGNOSIS — R2689 Other abnormalities of gait and mobility: Secondary | ICD-10-CM | POA: Diagnosis not present

## 2022-09-22 DIAGNOSIS — M6258 Muscle wasting and atrophy, not elsewhere classified, other site: Secondary | ICD-10-CM | POA: Diagnosis not present

## 2022-09-22 DIAGNOSIS — M6281 Muscle weakness (generalized): Secondary | ICD-10-CM | POA: Diagnosis not present

## 2022-09-22 NOTE — Patient Instructions (Signed)
Visit Information  Thank you for taking time to visit with me today. Please don't hesitate to contact me if I can be of assistance to you.   Please call the care guide team at [phone] if you need to cancel or reschedule your appointment.   If you are experiencing a Mental Health or Behavioral Health Crisis or need someone to talk to, please call the Suicide and Crisis Lifeline: 988 call the [place] National Suicide Prevention Lifeline: 1-800-273-8255 or TTY: 1-800-799-4 TTY (1-800-799-4889) to talk to a trained counselor call 1-800-273-TALK (toll free, 24 hour hotline) go to [facility] [address], [place] ([phone]) call the [place] Crisis Line: 800-939-9988 call 911  Patient verbalizes understanding of instructions and care plan provided today and agrees to view in MyChart. Active MyChart status and patient understanding of how to access instructions and care plan via MyChart confirmed with patient.     No further follow up required.  Rochella Benner, BSW, MSW, LCSW  Licensed Clinical Social Worker  [facility]  Mailing Address-[address], [place], [place] [address] Physical Address-[address], [place], [place] [address] Toll Free Main # [phone] Fax # [phone] Cell # [phone] [email or web address]            

## 2022-09-22 NOTE — Patient Outreach (Signed)
  Care Coordination   Initial Visit Note   09/22/2022  Name: Autumn Dean MRN: [id number] DOB: 1932-11-09  Autumn Dean is a 88 y.o. year old female who sees Vyas, Costella Hatcher, MD for primary care. I spoke with patient's nephew and healthcare power of attorney, Georges Mouse by phone today.  What matters to the patients health and wellness today?  No Interventions Identified.  SDOH assessments and interventions completed:  Yes.  SDOH Interventions Today    Flowsheet Row Most Recent Value  SDOH Interventions   Food Insecurity Interventions Intervention Not Indicated, Other (Comment)  [Verified by [employer] ([place] Interventions Intervention Not Indicated, Other (Comment)  [Verified by [employer] ([place]  Transportation Interventions Intervention Not Indicated, Patient Resources (Friends/Family), Other (Comment)  [Verified by [employer] Production manager) - [place] Interventions Intervention Not Indicated, Other (Comment)  [Verified by [employer] ([place]) - [name]alk[place]]  Alcohol Usage Interventions Intervention Not Indicated (Score <7), Other (Comment)  [Verified by [employer] ([place]) - [place] Strain Interventions Intervention Not Indicated, Other (Comment)  [Verified by [employer] ([place]  Physical Activity Interventions Intervention Not Indicated, Other (Comments)  [Verified by [employer] ([place]  Stress Interventions Intervention Not Indicated, Other (Comment)  [Verified by [employer] ([place]) - [place] Connections Interventions Intervention Not Indicated, Other (Comment)  [Verified by [employer] ([place]) - [place] Coordination Interventions Activated:  Yes.   Care Coordination Interventions:  Yes,  provided.   Follow up plan: No further intervention required.   Encounter Outcome:  Pt. Visit Completed.   Nat Christen, BSW, MSW, LCSW  Licensed Education officer, [facility]  Mailing [place] [address], [place], [place] [address] Physical Address-[address] [place], [place] [address] Toll Free Main # [phone] Fax # [phone] Cell # [phone] [email or web address]

## 2022-09-23 DIAGNOSIS — M6258 Muscle wasting and atrophy, not elsewhere classified, other site: Secondary | ICD-10-CM | POA: Diagnosis not present

## 2022-09-23 DIAGNOSIS — M6281 Muscle weakness (generalized): Secondary | ICD-10-CM | POA: Diagnosis not present

## 2022-09-23 DIAGNOSIS — R2689 Other abnormalities of gait and mobility: Secondary | ICD-10-CM | POA: Diagnosis not present

## 2022-09-24 DIAGNOSIS — M6258 Muscle wasting and atrophy, not elsewhere classified, other site: Secondary | ICD-10-CM | POA: Diagnosis not present

## 2022-09-24 DIAGNOSIS — M6281 Muscle weakness (generalized): Secondary | ICD-10-CM | POA: Diagnosis not present

## 2022-09-24 DIAGNOSIS — R2689 Other abnormalities of gait and mobility: Secondary | ICD-10-CM | POA: Diagnosis not present

## 2022-09-25 DIAGNOSIS — R2689 Other abnormalities of gait and mobility: Secondary | ICD-10-CM | POA: Diagnosis not present

## 2022-09-25 DIAGNOSIS — M6281 Muscle weakness (generalized): Secondary | ICD-10-CM | POA: Diagnosis not present

## 2022-09-25 DIAGNOSIS — M6258 Muscle wasting and atrophy, not elsewhere classified, other site: Secondary | ICD-10-CM | POA: Diagnosis not present

## 2022-09-26 DIAGNOSIS — R2689 Other abnormalities of gait and mobility: Secondary | ICD-10-CM | POA: Diagnosis not present

## 2022-09-26 DIAGNOSIS — M6281 Muscle weakness (generalized): Secondary | ICD-10-CM | POA: Diagnosis not present

## 2022-09-26 DIAGNOSIS — M6258 Muscle wasting and atrophy, not elsewhere classified, other site: Secondary | ICD-10-CM | POA: Diagnosis not present

## 2022-09-29 DIAGNOSIS — R2689 Other abnormalities of gait and mobility: Secondary | ICD-10-CM | POA: Diagnosis not present

## 2022-09-29 DIAGNOSIS — M6258 Muscle wasting and atrophy, not elsewhere classified, other site: Secondary | ICD-10-CM | POA: Diagnosis not present

## 2022-09-29 DIAGNOSIS — M6281 Muscle weakness (generalized): Secondary | ICD-10-CM | POA: Diagnosis not present

## 2022-09-30 DIAGNOSIS — M6258 Muscle wasting and atrophy, not elsewhere classified, other site: Secondary | ICD-10-CM | POA: Diagnosis not present

## 2022-09-30 DIAGNOSIS — M6281 Muscle weakness (generalized): Secondary | ICD-10-CM | POA: Diagnosis not present

## 2022-09-30 DIAGNOSIS — R2689 Other abnormalities of gait and mobility: Secondary | ICD-10-CM | POA: Diagnosis not present

## 2022-10-01 DIAGNOSIS — R2689 Other abnormalities of gait and mobility: Secondary | ICD-10-CM | POA: Diagnosis not present

## 2022-10-01 DIAGNOSIS — M6258 Muscle wasting and atrophy, not elsewhere classified, other site: Secondary | ICD-10-CM | POA: Diagnosis not present

## 2022-10-01 DIAGNOSIS — M6281 Muscle weakness (generalized): Secondary | ICD-10-CM | POA: Diagnosis not present

## 2022-10-02 DIAGNOSIS — R2689 Other abnormalities of gait and mobility: Secondary | ICD-10-CM | POA: Diagnosis not present

## 2022-10-02 DIAGNOSIS — M6258 Muscle wasting and atrophy, not elsewhere classified, other site: Secondary | ICD-10-CM | POA: Diagnosis not present

## 2022-10-02 DIAGNOSIS — M6281 Muscle weakness (generalized): Secondary | ICD-10-CM | POA: Diagnosis not present

## 2022-10-03 DIAGNOSIS — M6281 Muscle weakness (generalized): Secondary | ICD-10-CM | POA: Diagnosis not present

## 2022-10-03 DIAGNOSIS — R2689 Other abnormalities of gait and mobility: Secondary | ICD-10-CM | POA: Diagnosis not present

## 2022-10-03 DIAGNOSIS — M6258 Muscle wasting and atrophy, not elsewhere classified, other site: Secondary | ICD-10-CM | POA: Diagnosis not present

## 2022-10-06 DIAGNOSIS — M6258 Muscle wasting and atrophy, not elsewhere classified, other site: Secondary | ICD-10-CM | POA: Diagnosis not present

## 2022-10-06 DIAGNOSIS — R2689 Other abnormalities of gait and mobility: Secondary | ICD-10-CM | POA: Diagnosis not present

## 2022-10-06 DIAGNOSIS — M6281 Muscle weakness (generalized): Secondary | ICD-10-CM | POA: Diagnosis not present

## 2022-10-07 DIAGNOSIS — M6258 Muscle wasting and atrophy, not elsewhere classified, other site: Secondary | ICD-10-CM | POA: Diagnosis not present

## 2022-10-07 DIAGNOSIS — M6281 Muscle weakness (generalized): Secondary | ICD-10-CM | POA: Diagnosis not present

## 2022-10-07 DIAGNOSIS — R2689 Other abnormalities of gait and mobility: Secondary | ICD-10-CM | POA: Diagnosis not present

## 2022-10-08 DIAGNOSIS — M6281 Muscle weakness (generalized): Secondary | ICD-10-CM | POA: Diagnosis not present

## 2022-10-08 DIAGNOSIS — M6258 Muscle wasting and atrophy, not elsewhere classified, other site: Secondary | ICD-10-CM | POA: Diagnosis not present

## 2022-10-08 DIAGNOSIS — R2689 Other abnormalities of gait and mobility: Secondary | ICD-10-CM | POA: Diagnosis not present

## 2022-10-09 DIAGNOSIS — M6281 Muscle weakness (generalized): Secondary | ICD-10-CM | POA: Diagnosis not present

## 2022-10-09 DIAGNOSIS — R2689 Other abnormalities of gait and mobility: Secondary | ICD-10-CM | POA: Diagnosis not present

## 2022-10-09 DIAGNOSIS — M6258 Muscle wasting and atrophy, not elsewhere classified, other site: Secondary | ICD-10-CM | POA: Diagnosis not present

## 2022-10-10 DIAGNOSIS — N3020 Other chronic cystitis without hematuria: Secondary | ICD-10-CM | POA: Diagnosis not present

## 2022-10-10 DIAGNOSIS — B9689 Other specified bacterial agents as the cause of diseases classified elsewhere: Secondary | ICD-10-CM | POA: Diagnosis not present

## 2022-10-10 DIAGNOSIS — I5032 Chronic diastolic (congestive) heart failure: Secondary | ICD-10-CM | POA: Diagnosis not present

## 2022-10-10 DIAGNOSIS — N390 Urinary tract infection, site not specified: Secondary | ICD-10-CM | POA: Diagnosis not present

## 2022-10-10 DIAGNOSIS — R2689 Other abnormalities of gait and mobility: Secondary | ICD-10-CM | POA: Diagnosis not present

## 2022-10-10 DIAGNOSIS — B961 Klebsiella pneumoniae [K. pneumoniae] as the cause of diseases classified elsewhere: Secondary | ICD-10-CM | POA: Diagnosis not present

## 2022-10-10 DIAGNOSIS — F0393 Unspecified dementia, unspecified severity, with mood disturbance: Secondary | ICD-10-CM | POA: Diagnosis not present

## 2022-10-10 DIAGNOSIS — B351 Tinea unguium: Secondary | ICD-10-CM | POA: Diagnosis not present

## 2022-10-10 DIAGNOSIS — M6258 Muscle wasting and atrophy, not elsewhere classified, other site: Secondary | ICD-10-CM | POA: Diagnosis not present

## 2022-10-10 DIAGNOSIS — Z87440 Personal history of urinary (tract) infections: Secondary | ICD-10-CM | POA: Diagnosis not present

## 2022-10-10 DIAGNOSIS — R400 Somnolence: Secondary | ICD-10-CM | POA: Diagnosis not present

## 2022-10-10 DIAGNOSIS — M6281 Muscle weakness (generalized): Secondary | ICD-10-CM | POA: Diagnosis not present

## 2022-10-11 DIAGNOSIS — N390 Urinary tract infection, site not specified: Secondary | ICD-10-CM | POA: Diagnosis not present

## 2022-10-13 DIAGNOSIS — R2689 Other abnormalities of gait and mobility: Secondary | ICD-10-CM | POA: Diagnosis not present

## 2022-10-13 DIAGNOSIS — M6281 Muscle weakness (generalized): Secondary | ICD-10-CM | POA: Diagnosis not present

## 2022-10-13 DIAGNOSIS — M6258 Muscle wasting and atrophy, not elsewhere classified, other site: Secondary | ICD-10-CM | POA: Diagnosis not present

## 2022-10-14 DIAGNOSIS — M6258 Muscle wasting and atrophy, not elsewhere classified, other site: Secondary | ICD-10-CM | POA: Diagnosis not present

## 2022-10-14 DIAGNOSIS — M6281 Muscle weakness (generalized): Secondary | ICD-10-CM | POA: Diagnosis not present

## 2022-10-14 DIAGNOSIS — R2689 Other abnormalities of gait and mobility: Secondary | ICD-10-CM | POA: Diagnosis not present

## 2022-10-14 DIAGNOSIS — L89150 Pressure ulcer of sacral region, unstageable: Secondary | ICD-10-CM | POA: Diagnosis not present

## 2022-10-16 DIAGNOSIS — R2689 Other abnormalities of gait and mobility: Secondary | ICD-10-CM | POA: Diagnosis not present

## 2022-10-16 DIAGNOSIS — M6258 Muscle wasting and atrophy, not elsewhere classified, other site: Secondary | ICD-10-CM | POA: Diagnosis not present

## 2022-10-16 DIAGNOSIS — M6281 Muscle weakness (generalized): Secondary | ICD-10-CM | POA: Diagnosis not present

## 2022-10-17 DIAGNOSIS — R2689 Other abnormalities of gait and mobility: Secondary | ICD-10-CM | POA: Diagnosis not present

## 2022-10-17 DIAGNOSIS — M6281 Muscle weakness (generalized): Secondary | ICD-10-CM | POA: Diagnosis not present

## 2022-10-17 DIAGNOSIS — M6258 Muscle wasting and atrophy, not elsewhere classified, other site: Secondary | ICD-10-CM | POA: Diagnosis not present

## 2022-10-20 ENCOUNTER — Inpatient Hospital Stay (HOSPITAL_COMMUNITY)
Admission: EM | Admit: 2022-10-20 | Discharge: 2022-10-24 | DRG: 698 | Disposition: A | Discharge status: Skilled Nursing Facility | Payer: Medicare PPO | Source: Skilled Nursing Facility | Attending: Internal Medicine | Admitting: Internal Medicine

## 2022-10-20 ENCOUNTER — Emergency Department (HOSPITAL_COMMUNITY): Payer: Medicare PPO

## 2022-10-20 ENCOUNTER — Encounter (HOSPITAL_COMMUNITY): Payer: Self-pay

## 2022-10-20 DIAGNOSIS — Z888 Allergy status to other drugs, medicaments and biological substances status: Secondary | ICD-10-CM | POA: Diagnosis not present

## 2022-10-20 DIAGNOSIS — Z91040 Latex allergy status: Secondary | ICD-10-CM

## 2022-10-20 DIAGNOSIS — D72829 Elevated white blood cell count, unspecified: Secondary | ICD-10-CM | POA: Diagnosis not present

## 2022-10-20 DIAGNOSIS — T83518A Infection and inflammatory reaction due to other urinary catheter, initial encounter: Secondary | ICD-10-CM | POA: Diagnosis not present

## 2022-10-20 DIAGNOSIS — A419 Sepsis, unspecified organism: Secondary | ICD-10-CM | POA: Diagnosis not present

## 2022-10-20 DIAGNOSIS — D631 Anemia in chronic kidney disease: Secondary | ICD-10-CM | POA: Diagnosis not present

## 2022-10-20 DIAGNOSIS — K219 Gastro-esophageal reflux disease without esophagitis: Secondary | ICD-10-CM | POA: Diagnosis present

## 2022-10-20 DIAGNOSIS — N183 Chronic kidney disease, stage 3 unspecified: Secondary | ICD-10-CM | POA: Diagnosis not present

## 2022-10-20 DIAGNOSIS — R531 Weakness: Secondary | ICD-10-CM | POA: Diagnosis not present

## 2022-10-20 DIAGNOSIS — D649 Anemia, unspecified: Secondary | ICD-10-CM

## 2022-10-20 DIAGNOSIS — I10 Essential (primary) hypertension: Secondary | ICD-10-CM | POA: Diagnosis not present

## 2022-10-20 DIAGNOSIS — I480 Paroxysmal atrial fibrillation: Secondary | ICD-10-CM | POA: Diagnosis present

## 2022-10-20 DIAGNOSIS — K5900 Constipation, unspecified: Secondary | ICD-10-CM

## 2022-10-20 DIAGNOSIS — R799 Abnormal finding of blood chemistry, unspecified: Secondary | ICD-10-CM | POA: Diagnosis not present

## 2022-10-20 DIAGNOSIS — F03B4 Unspecified dementia, moderate, with anxiety: Secondary | ICD-10-CM | POA: Diagnosis not present

## 2022-10-20 DIAGNOSIS — Y929 Unspecified place or not applicable: Secondary | ICD-10-CM

## 2022-10-20 DIAGNOSIS — R0602 Shortness of breath: Secondary | ICD-10-CM | POA: Diagnosis not present

## 2022-10-20 DIAGNOSIS — R3989 Other symptoms and signs involving the genitourinary system: Secondary | ICD-10-CM | POA: Diagnosis not present

## 2022-10-20 DIAGNOSIS — B961 Klebsiella pneumoniae [K. pneumoniae] as the cause of diseases classified elsewhere: Secondary | ICD-10-CM | POA: Diagnosis present

## 2022-10-20 DIAGNOSIS — Y738 Miscellaneous gastroenterology and urology devices associated with adverse incidents, not elsewhere classified: Secondary | ICD-10-CM | POA: Diagnosis present

## 2022-10-20 DIAGNOSIS — A4181 Sepsis due to Enterococcus: Secondary | ICD-10-CM | POA: Diagnosis present

## 2022-10-20 DIAGNOSIS — G9341 Metabolic encephalopathy: Secondary | ICD-10-CM

## 2022-10-20 DIAGNOSIS — Z7901 Long term (current) use of anticoagulants: Secondary | ICD-10-CM

## 2022-10-20 DIAGNOSIS — I5032 Chronic diastolic (congestive) heart failure: Secondary | ICD-10-CM | POA: Diagnosis present

## 2022-10-20 DIAGNOSIS — N390 Urinary tract infection, site not specified: Secondary | ICD-10-CM | POA: Diagnosis present

## 2022-10-20 DIAGNOSIS — Z885 Allergy status to narcotic agent status: Secondary | ICD-10-CM

## 2022-10-20 DIAGNOSIS — F03C0 Unspecified dementia, severe, without behavioral disturbance, psychotic disturbance, mood disturbance, and anxiety: Secondary | ICD-10-CM | POA: Diagnosis not present

## 2022-10-20 DIAGNOSIS — I110 Hypertensive heart disease with heart failure: Secondary | ICD-10-CM | POA: Diagnosis not present

## 2022-10-20 DIAGNOSIS — Z79899 Other long term (current) drug therapy: Secondary | ICD-10-CM | POA: Diagnosis not present

## 2022-10-20 DIAGNOSIS — I959 Hypotension, unspecified: Secondary | ICD-10-CM | POA: Diagnosis present

## 2022-10-20 DIAGNOSIS — E861 Hypovolemia: Secondary | ICD-10-CM | POA: Diagnosis present

## 2022-10-20 DIAGNOSIS — B964 Proteus (mirabilis) (morganii) as the cause of diseases classified elsewhere: Secondary | ICD-10-CM | POA: Diagnosis present

## 2022-10-20 DIAGNOSIS — N3289 Other specified disorders of bladder: Secondary | ICD-10-CM | POA: Diagnosis not present

## 2022-10-20 DIAGNOSIS — F0390 Unspecified dementia without behavioral disturbance: Secondary | ICD-10-CM | POA: Diagnosis present

## 2022-10-20 DIAGNOSIS — Z66 Do not resuscitate: Secondary | ICD-10-CM | POA: Diagnosis not present

## 2022-10-20 DIAGNOSIS — E871 Hypo-osmolality and hyponatremia: Secondary | ICD-10-CM | POA: Diagnosis not present

## 2022-10-20 DIAGNOSIS — U071 COVID-19: Secondary | ICD-10-CM | POA: Diagnosis present

## 2022-10-20 DIAGNOSIS — Z515 Encounter for palliative care: Secondary | ICD-10-CM

## 2022-10-20 DIAGNOSIS — L89150 Pressure ulcer of sacral region, unstageable: Secondary | ICD-10-CM | POA: Diagnosis not present

## 2022-10-20 DIAGNOSIS — Z960 Presence of urogenital implants: Secondary | ICD-10-CM | POA: Diagnosis not present

## 2022-10-20 DIAGNOSIS — R34 Anuria and oliguria: Secondary | ICD-10-CM | POA: Diagnosis not present

## 2022-10-20 DIAGNOSIS — R7881 Bacteremia: Secondary | ICD-10-CM

## 2022-10-20 DIAGNOSIS — R7889 Finding of other specified substances, not normally found in blood: Secondary | ICD-10-CM | POA: Diagnosis not present

## 2022-10-20 DIAGNOSIS — F411 Generalized anxiety disorder: Secondary | ICD-10-CM | POA: Diagnosis not present

## 2022-10-20 DIAGNOSIS — L98429 Non-pressure chronic ulcer of back with unspecified severity: Secondary | ICD-10-CM

## 2022-10-20 DIAGNOSIS — Z7189 Other specified counseling: Secondary | ICD-10-CM | POA: Diagnosis not present

## 2022-10-20 DIAGNOSIS — R627 Adult failure to thrive: Secondary | ICD-10-CM | POA: Diagnosis not present

## 2022-10-20 DIAGNOSIS — R404 Transient alteration of awareness: Secondary | ICD-10-CM | POA: Diagnosis not present

## 2022-10-20 DIAGNOSIS — A499 Bacterial infection, unspecified: Secondary | ICD-10-CM | POA: Insufficient documentation

## 2022-10-20 DIAGNOSIS — Z7401 Bed confinement status: Secondary | ICD-10-CM | POA: Diagnosis not present

## 2022-10-20 DIAGNOSIS — N281 Cyst of kidney, acquired: Secondary | ICD-10-CM | POA: Diagnosis not present

## 2022-10-20 DIAGNOSIS — R8290 Unspecified abnormal findings in urine: Secondary | ICD-10-CM | POA: Diagnosis not present

## 2022-10-20 DIAGNOSIS — F32A Depression, unspecified: Secondary | ICD-10-CM | POA: Diagnosis not present

## 2022-10-20 LAB — COMPREHENSIVE METABOLIC PANEL
ALT: 31 U/L (ref 0–44)
AST: 49 U/L — ABNORMAL HIGH (ref 15–41)
Albumin: 2.4 g/dL — ABNORMAL LOW (ref 3.5–5.0)
Alkaline Phosphatase: 103 U/L (ref 38–126)
Anion gap: 10 (ref 5–15)
BUN: 34 mg/dL — ABNORMAL HIGH (ref 8–23)
CO2: 22 mmol/L (ref 22–32)
Calcium: 10.1 mg/dL (ref 8.9–10.3)
Chloride: 97 mmol/L — ABNORMAL LOW (ref 98–111)
Creatinine, Ser: 0.95 mg/dL (ref 0.44–1.00)
GFR, Estimated: 58 mL/min — ABNORMAL LOW (ref >60)
Glucose, Bld: 124 mg/dL — ABNORMAL HIGH (ref 70–99)
Potassium: 4.8 mmol/L (ref 3.5–5.1)
Sodium: 129 mmol/L — ABNORMAL LOW (ref 135–145)
Total Bilirubin: 1.1 mg/dL (ref 0.3–1.2)
Total Protein: 5.9 g/dL — ABNORMAL LOW (ref 6.5–8.1)

## 2022-10-20 LAB — CBC WITH DIFFERENTIAL/PLATELET
Abs Immature Granulocytes: 0.22 10*3/uL — ABNORMAL HIGH (ref 0.00–0.07)
Basophils Absolute: 0 10*3/uL (ref 0.0–0.1)
Basophils Relative: 0 %
Eosinophils Absolute: 0 10*3/uL (ref 0.0–0.5)
Eosinophils Relative: 0 %
HCT: 32.9 % — ABNORMAL LOW (ref 36.0–46.0)
Hemoglobin: 11.1 g/dL — ABNORMAL LOW (ref 12.0–15.0)
Immature Granulocytes: 1 %
Lymphocytes Relative: 4 %
Lymphs Abs: 0.9 10*3/uL (ref 0.7–4.0)
MCH: 31.8 pg (ref 26.0–34.0)
MCHC: 33.7 g/dL (ref 30.0–36.0)
MCV: 94.3 fL (ref 80.0–100.0)
Monocytes Absolute: 0.9 10*3/uL (ref 0.1–1.0)
Monocytes Relative: 4 %
Neutro Abs: 23.1 10*3/uL — ABNORMAL HIGH (ref 1.7–7.7)
Neutrophils Relative %: 91 %
Platelets: 375 10*3/uL (ref 150–400)
RBC: 3.49 MIL/uL — ABNORMAL LOW (ref 3.87–5.11)
RDW: 16.3 % — ABNORMAL HIGH (ref 11.5–15.5)
WBC: 25.2 10*3/uL — ABNORMAL HIGH (ref 4.0–10.5)
nRBC: 0 % (ref 0.0–0.2)

## 2022-10-20 LAB — I-STAT CHEM 8, ED
BUN: 31 mg/dL — ABNORMAL HIGH (ref 8–23)
Calcium, Ion: 1.3 mmol/L (ref 1.15–1.40)
Chloride: 97 mmol/L — ABNORMAL LOW (ref 98–111)
Creatinine, Ser: 0.8 mg/dL (ref 0.44–1.00)
Glucose, Bld: 119 mg/dL — ABNORMAL HIGH (ref 70–99)
HCT: 33 % — ABNORMAL LOW (ref 36.0–46.0)
Hemoglobin: 11.2 g/dL — ABNORMAL LOW (ref 12.0–15.0)
Potassium: 4.6 mmol/L (ref 3.5–5.1)
Sodium: 128 mmol/L — ABNORMAL LOW (ref 135–145)
TCO2: 22 mmol/L (ref 22–32)

## 2022-10-20 LAB — URINALYSIS, ROUTINE W REFLEX MICROSCOPIC
Bilirubin Urine: NEGATIVE
Glucose, UA: NEGATIVE mg/dL
Hgb urine dipstick: NEGATIVE
Ketones, ur: NEGATIVE mg/dL
Nitrite: POSITIVE — AB
Protein, ur: >=300 mg/dL — AB
Specific Gravity, Urine: 1.017 (ref 1.005–1.030)
WBC, UA: >50 WBC/hpf — ABNORMAL HIGH (ref 0–5)
pH: 7 (ref 5.0–8.0)

## 2022-10-20 LAB — LACTIC ACID, PLASMA: Lactic Acid, Venous: 2.5 mmol/L (ref 0.5–1.9)

## 2022-10-20 LAB — CBG MONITORING, ED: Glucose-Capillary: 130 mg/dL — ABNORMAL HIGH (ref 70–99)

## 2022-10-20 MED ORDER — SODIUM CHLORIDE 0.9 % IV SOLN
1.0000 g | Freq: Two times a day (BID) | INTRAVENOUS | Status: DC
  Administered 2022-10-21 – 2022-10-22 (×5): 1 g via INTRAVENOUS
  Filled 2022-10-20 (×7): qty 20

## 2022-10-20 MED ORDER — SODIUM CHLORIDE 0.9 % IV SOLN: INTRAVENOUS | Status: AC

## 2022-10-20 MED ORDER — SODIUM CHLORIDE 0.9 % IV BOLUS
1000.0000 mL | Freq: Once | INTRAVENOUS | Status: AC
  Administered 2022-10-20: 1000 mL via INTRAVENOUS

## 2022-10-20 MED ORDER — SODIUM CHLORIDE 0.9 % IV SOLN: Freq: Once | INTRAVENOUS | Status: DC

## 2022-10-20 MED ORDER — SODIUM CHLORIDE 0.9 % IV SOLN
1.0000 g | Freq: Once | INTRAVENOUS | Status: AC
  Administered 2022-10-20: 1 g via INTRAVENOUS
  Filled 2022-10-20: qty 10

## 2022-10-20 NOTE — Assessment & Plan Note (Addendum)
-  pt did not have hearing aids on so was unable to answer questions appropriately. Family reports more rapid decline in her memory recently and did not even recognize family members for the first time last week.

## 2022-10-20 NOTE — Progress Notes (Signed)
Pharmacy Antibiotic Note  Autumn Dean is a 88 y.o. female admitted on 10/20/2022 with UTI.  Pharmacy has been consulted for Merrem dosing. Hx ESBL. WBC elevated. Renal function age appropriate.   Plan: Merrem 1g IV q12h Trend WBC, temp, renal function  De-escalate based on cultures   Height: 5' 3"  (160 cm) Weight: 54.4 kg (120 lb) IBW/kg (Calculated) : 52.4  Temp (24hrs), Avg:98.3 F (36.8 C), Min:98.3 F (36.8 C), Max:98.3 F (36.8 C)  Recent Labs  Lab 10/20/22 2040 10/20/22 2101  WBC 25.2*  --   CREATININE 0.95 0.80  LATICACIDVEN 2.5*  --     Estimated Creatinine Clearance: 41 mL/min (by C-G formula based on SCr of 0.8 mg/dL).    Allergies  Allergen Reactions   Macrobid [Nitrofurantoin Macrocrystal] Other (See Comments)    Extreme dizziness   Ciprofloxacin Nausea And Vomiting   Nitrofurantoin Nausea And Vomiting   Codeine Rash and Itching   Demerol Rash   Latex Rash    Redness    Narda Bonds, PharmD, BCPS Clinical Pharmacist Phone: [phone]

## 2022-10-20 NOTE — Assessment & Plan Note (Signed)
-   Reportedly SBP of 80s during EMS transport.  Has been borderline soft here with SBP around 96.  Keep on continuous IV fluid overnight continue to monitor.  Hold home antihypertensives.

## 2022-10-20 NOTE — ED Triage Notes (Signed)
EMS called for low H&H, low WBC from [facility] and rehab. Pt hx dementia. Pt hypotensive. Afib.

## 2022-10-20 NOTE — Assessment & Plan Note (Signed)
-   Currently rate controlled - Continue Eliquis

## 2022-10-20 NOTE — Assessment & Plan Note (Signed)
Stable with Hgb of 11.1. Usual baseline around 12.

## 2022-10-20 NOTE — H&P (Signed)
History and Physical    Patient: Autumn Dean BFX:[id number] DOB: 03-25-1933 DOA: 10/20/2022 DOS: the patient was seen and examined on 10/20/2022 PCP: Glenda Chroman, MD  Patient coming from: SNF  Chief Complaint: No chief complaint on file.  HPI: Autumn Dean is a 88 y.o. female with medical history significant of dementia, paroxysmal atrial fibrillation on Eliquis, ulcerative colitis, chronic diastolic heart failure, chronic indwelling Foley catheter, hx of ESBL Klebsiella bacteremia and complicated UTI and GERD who presents from [place] [facility] for evaluation of hypotension, elevated white count and decreased hemoglobin.  Reportedly she was found to be hypotensive with SBP in the 80s during EMS transport which improved with IV fluids.  In the ER, she was afebrile, mildly tachycardic with heart rate of 109, tachypneic with borderline hypotension with BP of 96/89.  WBC of 25.  Hemoglobin of 11.1.  Lactate of 2.5.  CMP is pending.  UA grossly positive with large leukocyte, positive nitrite, greater than 50 WBC and many bacteria. Review of Systems: unable to review all systems due to the inability of the patient to answer questions. Past Medical History:  Diagnosis Date   GERD (gastroesophageal reflux disease)    Hiatal hernia    Hypertension    Proctitis    UC (ulcerative colitis confined to rectum) [facility])    Past Surgical History:  Procedure Laterality Date   COLONOSCOPY     COLONOSCOPY  08/18/2012   Procedure: COLONOSCOPY;  Surgeon: Rogene Houston, MD;  Location: [facility];  Service: Endoscopy;  Laterality: N/A;  [place]     ESOPHAGOGASTRODUODENOSCOPY  12/18/2011   Procedure: ESOPHAGOGASTRODUODENOSCOPY (EGD);  Surgeon: Rogene Houston, MD;  Location: [facility] ENDO SU[facility];  Service: Endoscopy;  Laterality: N/A;  200   TONSILLECTOMY     Social History:  reports that she has never smoked. She has never been exposed to tobacco smoke. She has never  used smokeless tobacco. She reports that she does not drink alcohol and does not use drugs.  Allergies  Allergen Reactions   Macrobid [Nitrofurantoin Macrocrystal] Other (See Comments)    Extreme dizziness   Ciprofloxacin Nausea And Vomiting   Nitrofurantoin Nausea And Vomiting   Codeine Rash and Itching   Demerol Rash   Latex Rash    Redness    Family History  Problem Relation Age of Onset   Colon cancer Neg Hx     Prior to Admission medications   Medication Sig Start Date End Date Taking? Authorizing Provider  acetaminophen (TYLENOL) 325 MG tablet Take 650 mg by mouth every 6 (six) hours as needed for headache or fever (pain).    [provider]  acetaminophen (TYLENOL) 500 MG tablet Take 1,000 mg by mouth 2 (two) times daily.    [provider]  apixaban (ELIQUIS) 2.5 MG TABS tablet Take 2.5 mg by mouth 2 (two) times daily.    [provider]  busPIRone (BUSPAR) 5 MG tablet Take 5 mg by mouth 2 (two) times daily.    [provider]  carvedilol (COREG) 3.125 MG tablet Take 3.125 mg by mouth 2 (two) times daily.    [provider]  cholecalciferol (VITAMIN D3) 25 MCG (1000 UNIT) tablet Take 1,000 Units by mouth every morning.    [provider]  docusate sodium (COLACE) 100 MG capsule Take 100 mg by mouth at bedtime.    [provider]  esomeprazole (NEXIUM) 20 MG capsule Take 1 capsule (20  mg total) by mouth daily at 12 noon. Patient taking differently: Take 20 mg by mouth every morning. 11/07/20   Rehman, Mechele Dawley, MD  estradiol (ESTRACE) 0.1 MG/GM vaginal cream Place 1 Applicatorful vaginally once a week.    [provider]  fluticasone (FLONASE) 50 MCG/ACT nasal spray Place 1 spray into both nostrils daily as needed for allergies.    [provider]  folic acid (FOLVITE) 1 MG tablet TAKE 1 TABLET BY MOUTH DAILY Patient taking differently: Take 1 mg by mouth every morning. 06/04/20   Ezzard Standing,  PA-C  Lidocaine 4 % PTCH Place 1 patch onto the skin every morning. Apply to left hip    [provider]  lisinopril (ZESTRIL) 10 MG tablet Take 10 mg by mouth every morning.    [provider]  magnesium hydroxide (MILK OF MAGNESIA) 400 MG/5ML suspension Take 30 mLs by mouth at bedtime as needed (constipation (if no BM in prior 24 hours)).    [provider]  psyllium (REGULOID) 0.52 g capsule Take 0.52 g by mouth 2 (two) times daily.    [provider]  senna-docusate (SENOKOT-S) 8.6-50 MG tablet Take 1 tablet by mouth See admin instructions. Take one tablet by mouth every Monday, Wednesday, Friday at bedtime    [provider]  sulfaSALAzine (AZULFIDINE) 500 MG tablet Take 1 tablet (500 mg total) by mouth 3 (three) times daily. 10/30/20   Rehman, Mechele Dawley, MD  vitamin B-12 (CYANOCOBALAMIN) 1000 MCG tablet Take 1,000 mcg by mouth every morning.    [provider]  Zinc Oxide (BOUDREAUXS BUTT PASTE) 16 % OINT Apply 1 application topically See admin instructions. Apply topically to redness and rash over perineal and peri-rectal after cleansing area - three times daily and after cleansing as needed.    [provider]  Zinc Oxide (DESITIN) 40 % PSTE Apply 1 application topically See admin instructions. Apply topically to redness and rash over perineal and per-rectal after cleaning area three times daily and as needed when soiled    [provider]  sucralfate (CARAFATE) 1 GM/10ML suspension Take 10 mLs (1 g total) by mouth 4 (four) times daily. 12/04/11 01/28/12  Butch Penny, NP    Physical Exam: Vitals:   10/20/22 1950 10/20/22 2000 10/20/22 2030 10/20/22 2232  BP: 125/64 127/89 104/65 96/69  Pulse: (!) 101 (!) 104 94 (!) 109  Resp: 19 (!) 27 (!) 21 (!) 25  Temp: 98.3 F (36.8 C)     TempSrc: Axillary     SpO2: 98% 100% 99% 99%  Weight:      Height:       Constitutional: NAD, calm, comfortable, nontoxic appearing thin  and frail elderly female laying in bed initially asleep Eyes: lids and conjunctivae normal ENMT: Mucous membranes are moist.  Neck: normal, supple Respiratory: clear to auscultation bilaterally, no wheezing, no crackles. Normal respiratory effort. No accessory muscle use.  Cardiovascular: Regular rate and rhythm, no murmurs / rubs / gallops. No extremity edema. Abdomen: Soft with suprapubic tenderness, no mass palpated. Bowel sounds positive.  Musculoskeletal: no clubbing / cyanosis. No joint deformity upper and lower extremities.  Skin: no rashes, lesions, ulcers. No induration. Meriplex foam dressing on sacral region but did not allow for further examination due to pain from turning over in bed. Neurologic: CN 2-12 grossly intact.  Patient alert but unable to answer any questions appropriately as she did not have her hearing aids on.  Speech also is mumbled/slurred  due to missing dentition. Psychiatric: Pleasantly demented. Data Reviewed:  See HPI  Assessment and Plan: * Sepsis secondary to UTI ([place]) -Sepsis as evidenced by tachycardia, tachypnea and leukocytosis with grossly positive UA -She has indwelling foley that family believes was placed about 3-4 weeks ago due to urinary retention. Unclear if she has had voiding trial. Foley was changed out in ED but would advise voiding trial before discharge given her hx of ESBL UTI.  -She was given a dose of IV Rocephin in the ED.  However earlier this year she had history of ESBL Klebsiella bacteremia from UTI.  Will switch to IV meropenem pending urine culture -Continuous IV fluid overnight and trend lactate  Sacral ulcer (HCC) -pt noted to have Meriplex on exam but did not allow further exam to evaluate it due to pain with turning over in bed -will consult wound care to assist  Anemia Stable with Hgb of 11.1. Usual baseline around 12.   Hyponatremia - Presenting sodium of 129.  This is acute on chronic.  She ranges from 129-130 in the  past. -Continuous IV fluids overnight and will follow sodium in the morning  Chronic diastolic CHF (congestive heart failure) ([place]) -hypovolemic based on lab work. Keep on continuous IV fluid overnight  Dementia without behavioral disturbance ([place]) -pt did not have hearing aids on so was unable to answer questions appropriately. Family reports more rapid decline in her memory recently and did not even recognize family members for the first time last week.  Paroxysmal atrial fibrillation (HCC) - Currently rate controlled - Continue Eliquis  Hypotension - Reportedly SBP of 80s during EMS transport.  Has been borderline soft here with SBP around 96.  Keep on continuous IV fluid overnight continue to monitor.  Hold home antihypertensives.      Advance Care Planning:   Code Status: DNR -legal paperwork at bedside and also confirmed by nephew  Consults: none  Family Communication: Discussed with nephew. He gave additional number for niece [phone] Shirlean Mylar Mecimore) to call tomorrow in case he cannot be reached.   Severity of Illness: The appropriate patient status for this patient is INPATIENT. Inpatient status is judged to be reasonable and necessary in order to provide the required intensity of service to ensure the patient's safety. The patient's presenting symptoms, physical exam findings, and initial radiographic and laboratory data in the context of their chronic comorbidities is felt to place them at high risk for further clinical deterioration. Furthermore, it is not anticipated that the patient will be medically stable for discharge from the hospital within 2 midnights of admission.   * I certify that at the point of admission it is my clinical judgment that the patient will require inpatient hospital care spanning beyond 2 midnights from the point of admission due to high intensity of service, high risk for further deterioration and high frequency of surveillance  required.*  Author: Orene Desanctis, DO 10/20/2022 11:50 PM  For on call review [email or web address].

## 2022-10-20 NOTE — Assessment & Plan Note (Signed)
-  pt noted to have Meriplex on exam but did not allow further exam to evaluate it due to pain with turning over in bed -will consult wound care to assist

## 2022-10-20 NOTE — ED Provider Notes (Addendum)
[facility] EMERGENCY DEPARTMENT Provider Note   CSN: [id number] Arrival date & time: 10/20/22  1949     History  No chief complaint on file.   Autumn Dean is a 88 y.o. female.  88 year old female with prior medical history as detailed below presents from [place] for evaluation.  Patient with history of dementia.  Patient was sent for evaluation of hypotension, elevated white count, and decreased hemoglobin.  Patient is demented.  She is pleasantly confused.  She is without specific complaint.  Patient was noted to be hypotensive with EMS during initial transport with a systolic in the 64Q.  This is improved after IV fluids.  Patient with a valid DNR.  The history is provided by the patient, medical records and the EMS personnel.       Home Medications Prior to Admission medications   Medication Sig Start Date End Date Taking? Authorizing Provider  acetaminophen (TYLENOL) 325 MG tablet Take 650 mg by mouth every 6 (six) hours as needed for headache or fever (pain).    [provider]  acetaminophen (TYLENOL) 500 MG tablet Take 1,000 mg by mouth 2 (two) times daily.    [provider]  apixaban (ELIQUIS) 2.5 MG TABS tablet Take 2.5 mg by mouth 2 (two) times daily.    [provider]  busPIRone (BUSPAR) 5 MG tablet Take 5 mg by mouth 2 (two) times daily.    [provider]  carvedilol (COREG) 3.125 MG tablet Take 3.125 mg by mouth 2 (two) times daily.    [provider]  cholecalciferol (VITAMIN D3) 25 MCG (1000 UNIT) tablet Take 1,000 Units by mouth every morning.    [provider]  docusate sodium (COLACE) 100 MG capsule Take 100 mg by mouth at bedtime.    [provider]  esomeprazole (NEXIUM) 20 MG capsule Take 1 capsule (20 mg total) by mouth daily at 12 noon. Patient taking differently: Take 20 mg by mouth every morning. 11/07/20   Rehman, Mechele Dawley, MD  estradiol (ESTRACE) 0.1 MG/GM  vaginal cream Place 1 Applicatorful vaginally once a week.    [provider]  fluticasone (FLONASE) 50 MCG/ACT nasal spray Place 1 spray into both nostrils daily as needed for allergies.    [provider]  folic acid (FOLVITE) 1 MG tablet TAKE 1 TABLET BY MOUTH DAILY Patient taking differently: Take 1 mg by mouth every morning. 06/04/20   Ezzard Standing, PA-C  Lidocaine 4 % PTCH Place 1 patch onto the skin every morning. Apply to left hip    [provider]  lisinopril (ZESTRIL) 10 MG tablet Take 10 mg by mouth every morning.    [provider]  magnesium hydroxide (MILK OF MAGNESIA) 400 MG/5ML suspension Take 30 mLs by mouth at bedtime as needed (constipation (if no BM in prior 24 hours)).    [provider]  psyllium (REGULOID) 0.52 g capsule Take 0.52 g by mouth 2 (two) times daily.    [provider]  senna-docusate (SENOKOT-S) 8.6-50 MG tablet Take 1 tablet by mouth See admin instructions. Take one tablet by mouth every Monday, Wednesday, Friday at bedtime    [provider]  sulfaSALAzine (AZULFIDINE) 500 MG tablet Take 1 tablet (500 mg total) by mouth 3 (three) times daily. 10/30/20   Rehman, Mechele Dawley, MD  vitamin B-12 (CYANOCOBALAMIN) 1000 MCG tablet Take 1,000 mcg by mouth every morning.    [provider]  Zinc Oxide (BOUDREAUXS BUTT  PASTE) 16 % OINT Apply 1 application topically See admin instructions. Apply topically to redness and rash over perineal and peri-rectal after cleansing area - three times daily and after cleansing as needed.    [provider]  Zinc Oxide (DESITIN) 40 % PSTE Apply 1 application topically See admin instructions. Apply topically to redness and rash over perineal and per-rectal after cleaning area three times daily and as needed when soiled    [provider]  sucralfate (CARAFATE) 1 GM/10ML suspension Take 10 mLs (1 g total) by mouth 4 (four) times daily. 12/04/11 01/28/12   Setzer, Rona Ravens, NP      Allergies    Macrobid [nitrofurantoin macrocrystal], Ciprofloxacin, Nitrofurantoin, Codeine, Demerol, and Latex    Review of Systems   Review of Systems  Unable to perform ROS: Dementia    Physical Exam Updated Vital Signs BP 125/64 (BP Location: Right Arm)   Pulse (!) 101   Temp 98.3 F (36.8 C) (Axillary)   Resp 19   Ht 5' 3"  (1.6 m)   Wt 54.4 kg   SpO2 98%   BMI 21.26 kg/m  Physical Exam Vitals and nursing note reviewed.  Constitutional:      General: She is not in acute distress.    Appearance: Normal appearance. She is well-developed.  HENT:     Head: Normocephalic and atraumatic.  Eyes:     Conjunctiva/sclera: Conjunctivae normal.     Pupils: Pupils are equal, round, and reactive to light.  Cardiovascular:     Rate and Rhythm: Normal rate and regular rhythm.     Heart sounds: Normal heart sounds.  Pulmonary:     Effort: Pulmonary effort is normal. No respiratory distress.     Breath sounds: Normal breath sounds.  Abdominal:     General: There is no distension.     Palpations: Abdomen is soft.     Tenderness: There is no abdominal tenderness.  Genitourinary:    Comments: Foley in place. Musculoskeletal:        General: No deformity. Normal range of motion.     Cervical back: Normal range of motion and neck supple.  Skin:    General: Skin is warm and dry.  Neurological:     General: No focal deficit present.     Mental Status: She is alert.     ED Results / Procedures / Treatments   Labs (all labs ordered are listed, but only abnormal results are displayed) Labs Reviewed  URINALYSIS, ROUTINE W REFLEX MICROSCOPIC - Abnormal; Notable for the following components:      Result Value   Color, Urine AMBER (*)    APPearance TURBID (*)    Protein, ur >=300 (*)    Nitrite POSITIVE (*)    Leukocytes,Ua LARGE (*)    WBC, UA >50 (*)    Bacteria, UA MANY (*)    All other components within normal limits  CBC WITH  DIFFERENTIAL/PLATELET - Abnormal; Notable for the following components:   WBC 25.2 (*)    RBC 3.49 (*)    Hemoglobin 11.1 (*)    HCT 32.9 (*)    RDW 16.3 (*)    Neutro Abs 23.1 (*)    Abs Immature Granulocytes 0.22 (*)    All other components within normal limits  LACTIC ACID, PLASMA - Abnormal; Notable for the following components:   Lactic Acid, Venous 2.5 (*)    All other components within normal limits  I-STAT CHEM 8, ED - Abnormal; Notable for  the following components:   Sodium 128 (*)    Chloride 97 (*)    BUN 31 (*)    Glucose, Bld 119 (*)    Hemoglobin 11.2 (*)    HCT 33.0 (*)    All other components within normal limits  CBG MONITORING, ED - Abnormal; Notable for the following components:   Glucose-Capillary 130 (*)    All other components within normal limits  URINE CULTURE  CULTURE, BLOOD (ROUTINE X 2)  CULTURE, BLOOD (ROUTINE X 2)  RESP PANEL BY RT-PCR (RSV, FLU A&B, COVID)  RVPGX2  COMPREHENSIVE METABOLIC PANEL  LACTIC ACID, PLASMA  TROPONIN I (HIGH SENSITIVITY)  TROPONIN I (HIGH SENSITIVITY)    EKG None  Radiology DG Chest Port 1 View  Result Date: 10/20/2022 CLINICAL DATA:  Shortness of breath EXAM: PORTABLE CHEST 1 VIEW COMPARISON:  12/20/2021 FINDINGS: Cardiomegaly. Both lungs are clear. The visualized skeletal structures are unremarkable. Unchanged air-filled colon beneath the right hemidiaphragm. IMPRESSION: Cardiomegaly without acute abnormality of the lungs. Electronically Signed   By: Delanna Ahmadi M.D.   On: 10/20/2022 20:18    Procedures Procedures    Medications Ordered in ED Medications  sodium chloride 0.9 % bolus 1,000 mL (has no administration in time range)  cefTRIAXone (ROCEPHIN) 1 g in sodium chloride 0.9 % 100 mL IVPB (has no administration in time range)    ED Course/ Medical Decision Making/ A&P                           Medical Decision Making Amount and/or Complexity of Data Reviewed Labs: ordered. Radiology:  ordered.  Risk Prescription drug management. Decision regarding hospitalization.    Medical Screen Complete  This patient presented to the ED with complaint of hypotension, elevated white count.  This complaint involves an extensive number of treatment options. The initial differential diagnosis includes, but is not limited to, bacterial versus viral infection, metabolic abnormality, etc.  This presentation is: Acute, Chronic, Self-Limited, Previously Undiagnosed, Uncertain Prognosis, Complicated, Systemic Symptoms, and Threat to Life/Bodily Function  Patient is presenting for evaluation from her facility.  Patient with apparent noted hypotension.  Patient also with recently reported elevated white count.  Patient with chronic indwelling Foley.  It is unclear from provided documents as to the length of time that this Foley catheter has been in place.  Foley catheter exchanged and new urine obtained.  Patient's labs are remarkable for elevated white count at 25.  Patient's lactic acid is 2.5.  Patient's urine obtained after Foley change is concerning for likely UTI.  Patient is afebrile.  Hypotension noted by EMS appears to have been transient.  Patient with likely UTI.  She would benefit from admission for further workup and treatment.  Medicine service made aware of case and will evaluate for same.  Additional history obtained:  Additional history obtained from EMS External records from outside sources obtained and reviewed including prior ED visits and prior Inpatient records.    Lab Tests:  I ordered and personally interpreted labs.  The pertinent results include: CBC, CMP, i-STAT Chem-8, UA, lactic acid   Imaging Studies ordered:  I ordered imaging studies including CXR  I independently visualized and interpreted obtained imaging which showed NAD I agree with the radiologist interpretation.   Cardiac Monitoring:  The patient was maintained on a cardiac monitor.   I personally viewed and interpreted the cardiac monitor which showed an underlying rhythm of: NSR   Medicines ordered:  I ordered medication including rocephin  for suspected UTI Reevaluation of the patient after these medicines showed that the patient: improved   Problem List / ED Course:  Elevated white count, UTI   Reevaluation:  After the interventions noted above, I reevaluated the patient and found that they have: improved  Disposition:  After consideration of the diagnostic results and the patients response to treatment, I feel that the patent would benefit from admission.          Final Clinical Impression(s) / ED Diagnoses Final diagnoses:  Leukocytosis, unspecified type  Urinary tract infection without hematuria, site unspecified    Rx / DC Orders ED Discharge Orders     None         Valarie Merino, MD 10/20/22 2249    Valarie Merino, MD 10/20/22 2251

## 2022-10-20 NOTE — Assessment & Plan Note (Signed)
-  hypovolemic based on lab work. Keep on continuous IV fluid overnight

## 2022-10-20 NOTE — Assessment & Plan Note (Addendum)
-  Sepsis as evidenced by tachycardia, tachypnea and leukocytosis with grossly positive UA -She has indwelling foley that family believes was placed about 3-4 weeks ago due to urinary retention. Unclear if she has had voiding trial. Foley was changed out in ED but would advise voiding trial before discharge given her hx of ESBL UTI.  -She was given a dose of IV Rocephin in the ED.  However earlier this year she had history of ESBL Klebsiella bacteremia from UTI.  Will switch to IV meropenem pending urine culture -Continuous IV fluid overnight and trend lactate

## 2022-10-20 NOTE — ED Notes (Signed)
Received verbal report from [place] at this time

## 2022-10-20 NOTE — Assessment & Plan Note (Signed)
-   Presenting sodium of 129.  This is acute on chronic.  She ranges from 129-130 in the past. -Continuous IV fluids overnight and will follow sodium in the morning

## 2022-10-20 NOTE — ED Notes (Signed)
Admit provider at bedside at this time

## 2022-10-21 ENCOUNTER — Other Ambulatory Visit: Payer: Self-pay

## 2022-10-21 DIAGNOSIS — I5032 Chronic diastolic (congestive) heart failure: Secondary | ICD-10-CM | POA: Diagnosis not present

## 2022-10-21 DIAGNOSIS — F0390 Unspecified dementia without behavioral disturbance: Secondary | ICD-10-CM | POA: Diagnosis not present

## 2022-10-21 DIAGNOSIS — D649 Anemia, unspecified: Secondary | ICD-10-CM | POA: Diagnosis not present

## 2022-10-21 DIAGNOSIS — A419 Sepsis, unspecified organism: Secondary | ICD-10-CM | POA: Diagnosis not present

## 2022-10-21 LAB — BASIC METABOLIC PANEL
Anion gap: 8 (ref 5–15)
BUN: 29 mg/dL — ABNORMAL HIGH (ref 8–23)
CO2: 21 mmol/L — ABNORMAL LOW (ref 22–32)
Calcium: 9.3 mg/dL (ref 8.9–10.3)
Chloride: 103 mmol/L (ref 98–111)
Creatinine, Ser: 0.73 mg/dL (ref 0.44–1.00)
GFR, Estimated: >60 mL/min (ref >60)
Glucose, Bld: 95 mg/dL (ref 70–99)
Potassium: 4.3 mmol/L (ref 3.5–5.1)
Sodium: 132 mmol/L — ABNORMAL LOW (ref 135–145)

## 2022-10-21 LAB — BLOOD CULTURE ID PANEL (REFLEXED) - BCID2
A.calcoaceticus-baumannii: NOT DETECTED
Bacteroides fragilis: DETECTED — AB
CTX-M ESBL: NOT DETECTED
Candida albicans: NOT DETECTED
Candida auris: NOT DETECTED
Candida glabrata: NOT DETECTED
Candida krusei: NOT DETECTED
Candida parapsilosis: NOT DETECTED
Candida tropicalis: NOT DETECTED
Carbapenem resist OXA 48 LIKE: NOT DETECTED
Carbapenem resistance IMP: NOT DETECTED
Carbapenem resistance KPC: NOT DETECTED
Carbapenem resistance NDM: NOT DETECTED
Carbapenem resistance VIM: NOT DETECTED
Cryptococcus neoformans/gattii: NOT DETECTED
Enterobacter cloacae complex: NOT DETECTED
Enterobacterales: DETECTED — AB
Enterococcus Faecium: NOT DETECTED
Enterococcus faecalis: DETECTED — AB
Escherichia coli: NOT DETECTED
Haemophilus influenzae: NOT DETECTED
Klebsiella aerogenes: NOT DETECTED
Klebsiella oxytoca: NOT DETECTED
Klebsiella pneumoniae: NOT DETECTED
Listeria monocytogenes: NOT DETECTED
Neisseria meningitidis: NOT DETECTED
Proteus species: DETECTED — AB
Pseudomonas aeruginosa: NOT DETECTED
Salmonella species: NOT DETECTED
Serratia marcescens: NOT DETECTED
Staphylococcus aureus (BCID): NOT DETECTED
Staphylococcus epidermidis: NOT DETECTED
Staphylococcus lugdunensis: NOT DETECTED
Staphylococcus species: NOT DETECTED
Stenotrophomonas maltophilia: NOT DETECTED
Streptococcus agalactiae: NOT DETECTED
Streptococcus pneumoniae: NOT DETECTED
Streptococcus pyogenes: NOT DETECTED
Streptococcus species: NOT DETECTED
Vancomycin resistance: NOT DETECTED

## 2022-10-21 LAB — RESP PANEL BY RT-PCR (RSV, FLU A&B, COVID)  RVPGX2
Influenza A by PCR: NEGATIVE
Influenza B by PCR: NEGATIVE
Resp Syncytial Virus by PCR: NEGATIVE
SARS Coronavirus 2 by RT PCR: POSITIVE — AB

## 2022-10-21 LAB — CBC
HCT: 28.3 % — ABNORMAL LOW (ref 36.0–46.0)
Hemoglobin: 9.6 g/dL — ABNORMAL LOW (ref 12.0–15.0)
MCH: 32 pg (ref 26.0–34.0)
MCHC: 33.9 g/dL (ref 30.0–36.0)
MCV: 94.3 fL (ref 80.0–100.0)
Platelets: 312 10*3/uL (ref 150–400)
RBC: 3 MIL/uL — ABNORMAL LOW (ref 3.87–5.11)
RDW: 16.5 % — ABNORMAL HIGH (ref 11.5–15.5)
WBC: 21.9 10*3/uL — ABNORMAL HIGH (ref 4.0–10.5)
nRBC: 0 % (ref 0.0–0.2)

## 2022-10-21 LAB — TROPONIN I (HIGH SENSITIVITY)
Troponin I (High Sensitivity): 10 ng/L (ref <18)
Troponin I (High Sensitivity): 11 ng/L (ref <18)

## 2022-10-21 LAB — LACTIC ACID, PLASMA: Lactic Acid, Venous: 1.5 mmol/L (ref 0.5–1.9)

## 2022-10-21 MED ORDER — VITAMIN D 25 MCG (1000 UNIT) PO TABS
1000.0000 [IU] | ORAL_TABLET | Freq: Every morning | ORAL | Status: DC
  Administered 2022-10-21 – 2022-10-23 (×3): 1000 [IU] via ORAL
  Filled 2022-10-21 (×3): qty 1

## 2022-10-21 MED ORDER — SULFASALAZINE 500 MG PO TABS
500.0000 mg | ORAL_TABLET | Freq: Three times a day (TID) | ORAL | Status: DC
  Administered 2022-10-21 – 2022-10-23 (×7): 500 mg via ORAL
  Filled 2022-10-21 (×9): qty 1

## 2022-10-21 MED ORDER — MOLNUPIRAVIR EUA 200MG CAPSULE
4.0000 | ORAL_CAPSULE | Freq: Two times a day (BID) | ORAL | Status: DC
  Administered 2022-10-21 – 2022-10-22 (×4): 800 mg via ORAL
  Filled 2022-10-21 (×2): qty 4

## 2022-10-21 MED ORDER — VANCOMYCIN HCL 1250 MG/250ML IV SOLN
1250.0000 mg | Freq: Once | INTRAVENOUS | Status: AC
  Administered 2022-10-21: 1250 mg via INTRAVENOUS
  Filled 2022-10-21: qty 250

## 2022-10-21 MED ORDER — DIGOXIN 0.25 MG/ML IJ SOLN
0.2500 mg | Freq: Once | INTRAMUSCULAR | Status: AC
  Administered 2022-10-21: 0.25 mg via INTRAVENOUS
  Filled 2022-10-21: qty 2

## 2022-10-21 MED ORDER — FOLIC ACID 1 MG PO TABS
1.0000 mg | ORAL_TABLET | Freq: Every day | ORAL | Status: DC
  Administered 2022-10-21 – 2022-10-23 (×3): 1 mg via ORAL
  Filled 2022-10-21 (×3): qty 1

## 2022-10-21 MED ORDER — PANTOPRAZOLE SODIUM 40 MG PO TBEC
40.0000 mg | DELAYED_RELEASE_TABLET | Freq: Every day | ORAL | Status: DC
  Administered 2022-10-21 – 2022-10-23 (×3): 40 mg via ORAL
  Filled 2022-10-21 (×3): qty 1

## 2022-10-21 MED ORDER — BUSPIRONE HCL 5 MG PO TABS
5.0000 mg | ORAL_TABLET | Freq: Two times a day (BID) | ORAL | Status: DC
  Administered 2022-10-21 – 2022-10-23 (×5): 5 mg via ORAL
  Filled 2022-10-21 (×5): qty 1

## 2022-10-21 MED ORDER — ACETAMINOPHEN 500 MG PO TABS
1000.0000 mg | ORAL_TABLET | Freq: Two times a day (BID) | ORAL | Status: DC
  Administered 2022-10-21 – 2022-10-23 (×5): 1000 mg via ORAL
  Filled 2022-10-21 (×5): qty 2

## 2022-10-21 MED ORDER — APIXABAN 2.5 MG PO TABS
2.5000 mg | ORAL_TABLET | Freq: Two times a day (BID) | ORAL | Status: DC
  Administered 2022-10-21 – 2022-10-23 (×5): 2.5 mg via ORAL
  Filled 2022-10-21 (×5): qty 1

## 2022-10-21 MED ORDER — MEDIHONEY WOUND/BURN DRESSING EX PSTE
1.0000 | PASTE | Freq: Every day | CUTANEOUS | Status: DC
  Administered 2022-10-22 – 2022-10-24 (×3): 1 via TOPICAL
  Filled 2022-10-21: qty 44

## 2022-10-21 MED ORDER — VITAMIN B-12 1000 MCG PO TABS
1000.0000 ug | ORAL_TABLET | Freq: Every morning | ORAL | Status: DC
  Administered 2022-10-21 – 2022-10-23 (×3): 1000 ug via ORAL
  Filled 2022-10-21 (×4): qty 1

## 2022-10-21 MED ORDER — VANCOMYCIN HCL 750 MG/150ML IV SOLN
750.0000 mg | INTRAVENOUS | Status: DC
  Administered 2022-10-22: 750 mg via INTRAVENOUS
  Filled 2022-10-21: qty 150

## 2022-10-21 NOTE — ED Notes (Signed)
Assisted patient to eat a few bites of breakfast until patient adamant she does not want anymore. Ate about five bites of breakfast with a few sips of water. Refusing any more assistance to eat.

## 2022-10-21 NOTE — Progress Notes (Signed)
Pharmacy Antibiotic Note  Autumn Dean is a 88 y.o. female admitted on 10/20/2022 with bacteremia.  Pharmacy has been consulted for Vancomycin dosing. Pharmacy consulted by ID to start vancomycin empirically for polymicrobial bacteremia. Patient growing enterococcus, proteus, and bacteroides and is also on meropenem.   Plan: Vancomycin 1278m IV x 1, then 750 mg IV q24h for eAUC of 498 using Scr of 0.8 Monitor for de-escalation and clinical course Vancomycin levels if needed.   Height: 5' 3"  (160 cm) Weight: 54.4 kg (120 lb) IBW/kg (Calculated) : 52.4  Temp (24hrs), Avg:98.1 F (36.7 C), Min:97.5 F (36.4 C), Max:98.5 F (36.9 C)  Recent Labs  Lab 10/20/22 2040 10/20/22 2101 10/21/22 0117 10/21/22 0425  WBC 25.2*  --   --  21.9*  CREATININE 0.95 0.80  --  0.73  LATICACIDVEN 2.5*  --  1.5  --     Estimated Creatinine Clearance: 41 mL/min (by C-G formula based on SCr of 0.73 mg/dL).    Allergies  Allergen Reactions   Macrobid [Nitrofurantoin Macrocrystal] Other (See Comments)    Extreme dizziness   Ciprofloxacin Nausea And Vomiting   Nitrofurantoin Nausea And Vomiting   Codeine Rash and Itching   Demerol Rash   Latex Rash    Redness    Antimicrobials this admission: 12/18 CTX >> x 1 12/19 Meropenem >>  12/19 Vancomycin >>  Dose adjustments this admission: N/a  Microbiology results: 12/18 BCx: Enterococcus, proteus, bacteroides 12/18 UCx: collected  COVID + on molnupiravir  Sheridyn Canino A. PLevada Dy PharmD, BCPS, FNKF Clinical Pharmacist [place] Please utilize Amion for appropriate phone number to reach the unit pharmacist ([place]  10/21/2022 4:31 PM

## 2022-10-21 NOTE — Consult Note (Addendum)
[facility] Nurse Consult Note: Reason for Consult:Sacral pressure injury, Unstageable. DTPI in the periwound area proximal to sacral pressure injury. Wound type:pressure Pressure Injury POA: Yes Measurement:Bedside RN to measure and document wound measurements on Nursing Flow Sheet with next dressing change Wound XLK:GMWNUU with 100% yellow wound bed per Bedside RN Drainage (amount, consistency, odor) small serous to light yellow, consistent with autolytically debriding nonviable tissue Periwound:surrounding erythema, DTPI in the proximal periwound area at 2 o'clock Dressing procedure/placement/frequency: I have provided Nursing with guidance for the care of this pressure injury using MediHoney (leptospermum Manuka honey) applied to gauze to fill/cover defect after cleansing with NS. This is to be topped with dry gauze and covered with a silicone foam dressing for the sacrum. The dressing is to be changed daily and PRN soiling.Turning and repositioning is in place and I provided guidance to minimize time in the supine position. Bilateral heel boots are provided and a mattress replacement with low air loss feature is provided for PI prevention.  A photo of this wound is provided to the EMR by the Bedside RN and appreciated.  If desired and depending on the overall goals of care, a surgical consult may be considered.  [facility] nursing team will not follow, but will remain available to this patient, the nursing and medical teams.  Please re-consult if needed.  Thank you for inviting [place] to participate in this patient's Plan of Care.  Maudie Flakes, MSN, RN, CNS, [place], Serita Grammes, [employer], [employer] phone:  [phone]

## 2022-10-21 NOTE — Progress Notes (Addendum)
Patient admitted to 6N 20. Patient oriented to person, place, time and situation. No evidence of understanding. Patient confused and and uses incomprehensible words. Admission charting could not be completed due to patient not able to answer simple questions. Will defer to a later time.  Patient relaxing in bed with call light within reach.

## 2022-10-21 NOTE — Plan of Care (Addendum)
Id stewardship note  Full id consult to follow tomorrow   Polymicrobial bacteremia per bcid including e faecalis, proteus, bacteroides  Hx esbl kleb pna bsi 12/2021   Agree with vanc/meropenem for now. Pending final blood culture prior to switching potentially to piptazo if no esbl present  Rather atypical for these gi bugs to be all in the blood. ?abscess intraabdominal  --> ct abd pelv with contrast ordered

## 2022-10-21 NOTE — Progress Notes (Signed)
PROGRESS NOTE    Autumn Dean  WLN:[id number] DOB: Mar 08, 1933 DOA: 10/20/2022 PCP: Glenda Chroman, MD    Chief Complaint  Patient presents with   Hypotension    Brief Narrative:   Autumn Dean is a 88 y.o. female with medical history significant of dementia, paroxysmal atrial fibrillation on Eliquis, ulcerative colitis, chronic diastolic heart failure, chronic indwelling Foley catheter, hx of ESBL Klebsiella bacteremia and complicated UTI and GERD who presents from [place] for evaluation of hypotension, elevated white count and decreased hemoglobin.   Reportedly she was found to be hypotensive with SBP in the 80s during EMS transport which improved with IV fluids.   In the ER, she was afebrile, mildly tachycardic with heart rate of 109, tachypneic with borderline hypotension with BP of 96/89.  WBC of 25.  Hemoglobin of 11.1.  Lactate of 2.5.  CMP is pending.   UA grossly positive with large leukocyte, positive nitrite, greater than 50 WBC and many bacteria.  As well she was noted to have pressure ulcer, and she tested positive for COVID  Assessment & Plan:   Principal Problem:   Sepsis secondary to UTI [facility]) Active Problems:   Hypotension   Paroxysmal atrial fibrillation ([place])   Dementia without behavioral disturbance (HCC)   Chronic diastolic CHF (congestive heart failure) ([place])   Hyponatremia   Anemia   Sacral ulcer ([place])  Sepsis secondary to UTI ([place]) -Sepsis as evidenced by tachycardia, tachypnea and leukocytosis with grossly positive UA -She has indwelling foley that family believes was placed about 3-4 weeks ago due to urinary retention. Foley was changed out in ED. - will need voiding trial in couple days. -Given history of ESBL, continue with IV meropenem.  Follow on urine culture.   Sacral ulcer POA - wound care consulted    Anemia [place] 11.1 on admission, around baseline, trending down, likely due to IV fluids  COVID infection -Will start on  molnupiravir   Hyponatremia - Volume depletion, improving, continue with IV fluids   Chronic diastolic CHF (congestive heart failure) ([place]) -hypovolemic , he is on IV fluids    Dementia without behavioral disturbance ([place]) -Continue with   Paroxysmal atrial fibrillation (HCC) - Currently rate controlled - Continue Eliquis   Hypotension - Reportedly SBP of 80s during EMS transport.  Has been borderline soft here with SBP around 96.  Keep on continuous IV fluid overnight continue to monitor.  Hold home antihypertensives.     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   DVT prophylaxis: on Eliquis Code Status: DNR Family Communication: D/W nephew. Disposition:   Status is: Inpatient    Consultants:  None   Subjective:  No significant events overnight, she had poor breakfast this morning as discussed with staff, she is unable to provide any reliable complaints  Objective: Vitals:   10/21/22 0600 10/21/22 0630 10/21/22 0700 10/21/22 0727  BP: 104/60 (!) 99/45 (!) 102/56   Pulse: (!) 105 (!) 101 (!) 109   Resp: (!) 23 17 19    Temp:  98.1 F (36.7 C)  TempSrc:    Oral  SpO2: 99% 99% 100%   Weight:      Height:        Intake/Output Summary (Last 24 hours) at 10/21/2022 0919 Last data filed at 10/21/2022 0430 Gross per 24 hour  Intake 1176.55 ml  Output 275 ml  Net 901.55 ml   Filed Weights   10/20/22 1945  Weight: 54.4 kg    Examination:  Awake frail, chronically ill-appearing, deconditioned, does  not answer any questions appropriately Symmetrical Chest wall movement, no wheezing RRR,No Gallops,Rubs or new Murmurs, No Parasternal Heave +ve B.Sounds, Abd Soft, No tenderness, No rebound - guarding or rigidity. No Cyanosis, Clubbing or edema,  Sacral pressure ulcer, please see picture below       Data Reviewed: I have personally reviewed following labs and imaging studies  CBC: Recent Labs  Lab 10/20/22 2040 10/20/22 2101 10/21/22 0425  WBC 25.2*  --  21.9*  NEUTROABS 23.1*  --   --   HGB 11.1* 11.2* 9.6*  HCT 32.9* 33.0* 28.3*  MCV 94.3  --  94.3  PLT 375  --  341    Basic Metabolic Panel: Recent Labs  Lab 10/20/22 2040 10/20/22 2101 10/21/22 0425  NA 129* 128* 132*  K 4.8 4.6 4.3  CL 97* 97* 103  CO2 22  --  21*  GLUCOSE 124* 119* 95  BUN 34* 31* 29*  CREATININE 0.95 0.80 0.73  CALCIUM 10.1  --  9.3    GFR: Estimated Creatinine Clearance: 41 mL/min (by C-G formula based on SCr of 0.73 mg/dL).  Liver Function Tests: Recent Labs  Lab 10/20/22 2040  AST 49*  ALT 31  ALKPHOS 103  BILITOT 1.1  PROT 5.9*  ALBUMIN 2.4*    CBG: Recent Labs  Lab 10/20/22 2054  GLUCAP 130*     Recent Results (from the past 240 hour(s))  Resp panel by RT-PCR (RSV, Flu A&B, Covid) Anterior Nasal Swab     Status: Abnormal   Collection Time: 10/20/22  8:02 PM   Specimen: Anterior Nasal Swab  Result Value Ref Range Status   SARS Coronavirus 2 by RT PCR POSITIVE (A) NEGATIVE Final    Comment: (NOTE) SARS-CoV-2 target nucleic acids are DETECTED.  The SARS-CoV-2 RNA is generally detectable in upper respiratory specimens during the acute phase of infection. Positive results are indicative of the presence of the identified virus, but do not rule out bacterial infection or co-infection with other pathogens not detected by the test. Clinical correlation with patient history and other diagnostic information is necessary to determine patient infection status. The  expected result is Negative.  Fact Sheet for Patients: EntrepreneurPulse.com.au  Fact Sheet for Healthcare Providers: IncredibleEmployment.be  This test is not yet approved or cleared by the [place] FDA and  has been authorized for detection and/or diagnosis of SARS-CoV-2 by FDA under an Emergency Use Authorization (EUA).  This EUA will remain in effect (meaning this test can be used) for the duration of  the COVID-19 declaration under Section 564(b)(1) of the A ct, 21 U.S.C. section 360bbb-3(b)(1), unless the authorization is terminated or revoked sooner.     Influenza A by PCR NEGATIVE NEGATIVE Final   Influenza B by PCR NEGATIVE NEGATIVE Final    Comment: (NOTE) The Xpert Xpress SARS-CoV-2/FLU/RSV plus assay is intended as an aid in the diagnosis of influenza from Nasopharyngeal swab specimens and should not be used as a sole basis for treatment. Nasal washings  and aspirates are unacceptable for Xpert Xpress SARS-CoV-2/FLU/RSV testing.  Fact Sheet for Patients: EntrepreneurPulse.com.au  Fact Sheet for Healthcare Providers: IncredibleEmployment.be  This test is not yet approved or cleared by the [place] FDA and has been authorized for detection and/or diagnosis of SARS-CoV-2 by FDA under an Emergency Use Authorization (EUA). This EUA will remain in effect (meaning this test can be used) for the duration of the COVID-19 declaration under Section 564(b)(1) of the Act, 21 U.S.C. section 360bbb-3(b)(1), unless the authorization is terminated or revoked.     Resp Syncytial Virus by PCR NEGATIVE NEGATIVE Final    Comment: (NOTE) Fact Sheet for Patients: EntrepreneurPulse.com.au  Fact Sheet for Healthcare Providers: IncredibleEmployment.be  This test is not yet approved or cleared by the [place] FDA and has been authorized for detection and/or  diagnosis of SARS-CoV-2 by FDA under an Emergency Use Authorization (EUA). This EUA will remain in effect (meaning this test can be used) for the duration of the COVID-19 declaration under Section 564(b)(1) of the Act, 21 U.S.C. section 360bbb-3(b)(1), unless the authorization is terminated or revoked.  Performed at [facility], [place] [address], [place], [place] [address]   Culture, blood (routine x 2)     Status: None (Preliminary result)   Collection Time: 10/20/22  8:40 PM   Specimen: BLOOD  Result Value Ref Range Status   Specimen Description BLOOD RIGHT ANTECUBITAL  Final   Special Requests   Final    BOTTLES DRAWN AEROBIC AND ANAEROBIC Blood Culture adequate volume   Culture   Final    NO GROWTH < 12 HOURS Performed at [facility], [place] [address], [place], [place] [address]    Report Status PENDING  Incomplete  Culture, blood (routine x 2)     Status: None (Preliminary result)   Collection Time: 10/20/22  8:40 PM   Specimen: BLOOD  Result Value Ref Range Status   Specimen Description BLOOD SITE NOT SPECIFIED  Final   Special Requests   Final    BOTTLES DRAWN AEROBIC AND ANAEROBIC Blood Culture results may not be optimal due to an inadequate volume of blood received in culture bottles   Culture   Final    NO GROWTH < 12 HOURS Performed at [facility], [place] [address] [place], [place] [address]    Report Status PENDING  Incomplete         Radiology Studies: DG Chest Port 1 View  Result Date: 10/20/2022 CLINICAL DATA:  Shortness of breath EXAM: PORTABLE CHEST 1 VIEW COMPARISON:  12/20/2021 FINDINGS: Cardiomegaly. Both lungs are clear. The visualized skeletal structures are unremarkable. Unchanged air-filled colon beneath the right hemidiaphragm. IMPRESSION: Cardiomegaly without acute abnormality of the lungs. Electronically Signed   By: Delanna Ahmadi M.D.   On: 10/20/2022 20:18        Scheduled Meds:  leptospermum manuka honey  1 Application  Topical Daily   Continuous Infusions:  sodium chloride 75 mL/hr at 10/21/22 0430   meropenem (MERREM) IV Stopped (10/21/22 0110)     LOS: 1 day        Phillips Climes, MD [facility]   To contact the attending provider between 7A-7P or the covering provider during after hours 7P-7A, please log into the web site [email or web address] and access using universal [place] password for that web site. If you do not have the password, please call the hospital operator.  10/21/2022, 9:19 AM

## 2022-10-22 ENCOUNTER — Inpatient Hospital Stay (HOSPITAL_COMMUNITY): Payer: Medicare PPO

## 2022-10-22 DIAGNOSIS — G9341 Metabolic encephalopathy: Secondary | ICD-10-CM

## 2022-10-22 DIAGNOSIS — N390 Urinary tract infection, site not specified: Secondary | ICD-10-CM | POA: Diagnosis not present

## 2022-10-22 DIAGNOSIS — K5900 Constipation, unspecified: Secondary | ICD-10-CM

## 2022-10-22 DIAGNOSIS — R7881 Bacteremia: Secondary | ICD-10-CM

## 2022-10-22 DIAGNOSIS — A499 Bacterial infection, unspecified: Secondary | ICD-10-CM | POA: Insufficient documentation

## 2022-10-22 DIAGNOSIS — A419 Sepsis, unspecified organism: Secondary | ICD-10-CM | POA: Diagnosis not present

## 2022-10-22 LAB — CBC
HCT: 28.4 % — ABNORMAL LOW (ref 36.0–46.0)
Hemoglobin: 9.2 g/dL — ABNORMAL LOW (ref 12.0–15.0)
MCH: 31.4 pg (ref 26.0–34.0)
MCHC: 32.4 g/dL (ref 30.0–36.0)
MCV: 96.9 fL (ref 80.0–100.0)
Platelets: 307 10*3/uL (ref 150–400)
RBC: 2.93 MIL/uL — ABNORMAL LOW (ref 3.87–5.11)
RDW: 17 % — ABNORMAL HIGH (ref 11.5–15.5)
WBC: 21.7 10*3/uL — ABNORMAL HIGH (ref 4.0–10.5)
nRBC: 0 % (ref 0.0–0.2)

## 2022-10-22 LAB — BASIC METABOLIC PANEL
Anion gap: 9 (ref 5–15)
BUN: 20 mg/dL (ref 8–23)
CO2: 18 mmol/L — ABNORMAL LOW (ref 22–32)
Calcium: 9.7 mg/dL (ref 8.9–10.3)
Chloride: 103 mmol/L (ref 98–111)
Creatinine, Ser: 0.66 mg/dL (ref 0.44–1.00)
GFR, Estimated: >60 mL/min (ref >60)
Glucose, Bld: 94 mg/dL (ref 70–99)
Potassium: 4 mmol/L (ref 3.5–5.1)
Sodium: 130 mmol/L — ABNORMAL LOW (ref 135–145)

## 2022-10-22 MED ORDER — BISACODYL 10 MG RE SUPP
10.0000 mg | Freq: Once | RECTAL | Status: AC
  Administered 2022-10-22: 10 mg via RECTAL
  Filled 2022-10-22: qty 1

## 2022-10-22 MED ORDER — FLEET ENEMA 7-19 GM/118ML RE ENEM: 1.0000 | ENEMA | Freq: Every day | RECTAL | Status: DC | PRN

## 2022-10-22 MED ORDER — IOHEXOL 350 MG/ML SOLN
75.0000 mL | Freq: Once | INTRAVENOUS | Status: AC | PRN
  Administered 2022-10-22: 75 mL via INTRAVENOUS

## 2022-10-22 MED ORDER — IOHEXOL 9 MG/ML PO SOLN
500.0000 mL | ORAL | Status: DC
  Administered 2022-10-22: 500 mL via ORAL

## 2022-10-22 MED ORDER — POLYETHYLENE GLYCOL 3350 17 G PO PACK
17.0000 g | PACK | Freq: Two times a day (BID) | ORAL | Status: DC
  Administered 2022-10-22 – 2022-10-24 (×4): 17 g via ORAL
  Filled 2022-10-22 (×4): qty 1

## 2022-10-22 MED ORDER — CHLORHEXIDINE GLUCONATE CLOTH 2 % EX PADS
6.0000 | MEDICATED_PAD | Freq: Every day | CUTANEOUS | Status: DC
  Administered 2022-10-22 – 2022-10-23 (×2): 6 via TOPICAL

## 2022-10-22 NOTE — Consult Note (Signed)
[facility] for Infectious Disease    Date of Admission:  10/20/2022     Total days of antibiotics 2               Reason for Consult: Polymicrobial Bacteremia  Referring Provider: [employer] Primary Care Provider: Glenda Chroman, MD   ASSESSMENT:  Autumn Dean is an 88 y/o female with history of dementia and ESBL Klebsiella bacteremia with chronic indwelling foley presenting with signs of sepsis and found to have polymicrobial bacteremia with Proteus species, Enterococcus faecalis and bacteroides. Source of infection is unclear although possibly the urine with urine culture pending and growing gram negative rods. Foley changed in ED. CT abdomen/pelvis to check for other potential sources of infection including abscess given abdominal tenderness. Will continue with vancomycin and meropenem pending sensitivity results. Monitor new blood cultures for clearance of bacteremia. Check TTE. Therapeutic drug monitoring of renal function and vancomycin levels per protocol. Remaining medical and supportive care per primary team.   PLAN:  Continue current dose of meropenem and vancomycin. Monitor cultures for sensitivities and clearance of bacteremia.  Therapeutic drug monitoring of renal function and vancomycin levels. CT abdomen/pelvis to check for abscess or other source of infection.  TTE to check for endocarditis.  Remaining medical and supportive care per primary team.    Principal Problem:   Sepsis secondary to UTI [facility]) Active Problems:   Hypotension   Paroxysmal atrial fibrillation (HCC)   Dementia without behavioral disturbance (HCC)   Chronic diastolic CHF (congestive heart failure) (HCC)   Hyponatremia   Anemia   Sacral ulcer (HCC)    acetaminophen  1,000 mg Oral BID   apixaban  2.5 mg Oral BID   busPIRone  5 mg Oral BID   Chlorhexidine Gluconate Cloth  6 each Topical Daily   cholecalciferol  1,000 Units Oral q morning   cyanocobalamin  1,000 mcg Oral q  morning   folic acid  1 mg Oral Daily   leptospermum manuka honey  1 Application Topical Daily   molnupiravir EUA  4 capsule Oral BID   pantoprazole  40 mg Oral Daily   sulfaSALAzine  500 mg Oral TID     HPI: Autumn Dean is a 88 y.o. female with previous medical history of dementia, ulcerative colitis, hypertension, chronic diastolic heart failure chronic foley catheter, and history of ESBL Klebsiella bacteremia and UTI admitted from [place] with luekocytosis, anemia and hypotension.  Autumn Dean urine found to be concerning for possible UTI. Urine culture pending. Afebrile on admission with leukocytosis and WBC count of 21.7. Per family foley catheter may have last been changed 3-4 weeks ago and was changed in the ED. Blood cultures are positive for polymicrobial bacteremia with Proteus species, Enterococcus faecalis, and bacteroides. Secondary set of blood cultures drawn today. Initially started on meropenem and vancomycin added. Chest x-ray with no acute abnormalities. CT abdomen/pelvis ordered and pending. Testing for influenza and RSV negative and positive for Covid. Started on molnupiravir for this.   Autumn Dean was not arousable during visit and was not able to contribute to the history which was gleaned from chart review.   Review of Systems: Review of Systems  Unable to perform ROS: Dementia     Past Medical History:  Diagnosis Date   GERD (gastroesophageal reflux disease)    Hiatal hernia    Hypertension    Proctitis    UC (ulcerative colitis confined to rectum) [facility])     Social  History   Tobacco Use   Smoking status: Never    Passive exposure: Never   Smokeless tobacco: Never   Tobacco comments:    Verified by [employer] ([place]) - [name] Dalkes  Vaping Use   Vaping Use: Never used  Substance Use Topics   Alcohol use: No    Alcohol/week: 0.0 standard drinks of alcohol   Drug use: No    Family History  Problem  Relation Age of Onset   Colon cancer Neg Hx     Allergies  Allergen Reactions   Macrobid [Nitrofurantoin Macrocrystal] Other (See Comments)    Extreme dizziness   Ciprofloxacin Nausea And Vomiting   Nitrofurantoin Nausea And Vomiting   Butalbital-Asa-Caff-Codeine Itching, Rash and Other (See Comments)    unk   Codeine Itching and Rash    Codeine-butalbital-ASA-caff    Demerol Rash   Latex Rash    Redness    OBJECTIVE: Blood pressure 111/65, pulse (!) 101, temperature 97.9 F (36.6 C), temperature source Oral, resp. rate 18, height 5' 3"  (1.6 m), weight 54.4 kg, SpO2 98 %.  Physical Exam Constitutional:      General: She is not in acute distress.    Appearance: She is well-developed.  Cardiovascular:     Rate and Rhythm: Normal rate and regular rhythm.     Heart sounds: Normal heart sounds.  Pulmonary:     Effort: Pulmonary effort is normal.     Breath sounds: Normal breath sounds.  Abdominal:     Tenderness: There is abdominal tenderness.  Genitourinary:    Comments: Urine in catheter has significant sediment Skin:    General: Skin is warm and dry.     Lab Results Lab Results  Component Value Date   WBC 21.7 (H) 10/22/2022   HGB 9.2 (L) 10/22/2022   HCT 28.4 (L) 10/22/2022   MCV 96.9 10/22/2022   PLT 307 10/22/2022    Lab Results  Component Value Date   CREATININE 0.66 10/22/2022   BUN 20 10/22/2022   NA 130 (L) 10/22/2022   K 4.0 10/22/2022   CL 103 10/22/2022   CO2 18 (L) 10/22/2022    Lab Results  Component Value Date   ALT 31 10/20/2022   AST 49 (H) 10/20/2022   ALKPHOS 103 10/20/2022   BILITOT 1.1 10/20/2022     Microbiology: Recent Results (from the past 240 hour(s))  Resp panel by RT-PCR (RSV, Flu A&B, Covid) Anterior Nasal Swab     Status: Abnormal   Collection Time: 10/20/22  8:02 PM   Specimen: Anterior Nasal Swab  Result Value Ref Range Status   SARS Coronavirus 2 by RT PCR POSITIVE (A) NEGATIVE Final    Comment:  (NOTE) SARS-CoV-2 target nucleic acids are DETECTED.  The SARS-CoV-2 RNA is generally detectable in upper respiratory specimens during the acute phase of infection. Positive results are indicative of the presence of the identified virus, but do not rule out bacterial infection or co-infection with other pathogens not detected by the test. Clinical correlation with patient history and other diagnostic information is necessary to determine patient infection status. The expected result is Negative.  Fact Sheet for Patients: EntrepreneurPulse.com.au  Fact Sheet for Healthcare Providers: IncredibleEmployment.be  This test is not yet approved or cleared by the [place] FDA and  has been authorized for detection and/or diagnosis of SARS-CoV-2 by FDA under an Emergency Use Authorization (EUA).  This EUA will remain in effect (meaning this test can be used) for the  duration of  the COVID-19 declaration under Section 564(b)(1) of the A ct, 21 U.S.C. section 360bbb-3(b)(1), unless the authorization is terminated or revoked sooner.     Influenza A by PCR NEGATIVE NEGATIVE Final   Influenza B by PCR NEGATIVE NEGATIVE Final    Comment: (NOTE) The Xpert Xpress SARS-CoV-2/FLU/RSV plus assay is intended as an aid in the diagnosis of influenza from Nasopharyngeal swab specimens and should not be used as a sole basis for treatment. Nasal washings and aspirates are unacceptable for Xpert Xpress SARS-CoV-2/FLU/RSV testing.  Fact Sheet for Patients: EntrepreneurPulse.com.au  Fact Sheet for Healthcare Providers: IncredibleEmployment.be  This test is not yet approved or cleared by the [place] FDA and has been authorized for detection and/or diagnosis of SARS-CoV-2 by FDA under an Emergency Use Authorization (EUA). This EUA will remain in effect (meaning this test can be used) for the duration of the COVID-19  declaration under Section 564(b)(1) of the Act, 21 U.S.C. section 360bbb-3(b)(1), unless the authorization is terminated or revoked.     Resp Syncytial Virus by PCR NEGATIVE NEGATIVE Final    Comment: (NOTE) Fact Sheet for Patients: EntrepreneurPulse.com.au  Fact Sheet for Healthcare Providers: IncredibleEmployment.be  This test is not yet approved or cleared by the [place] FDA and has been authorized for detection and/or diagnosis of SARS-CoV-2 by FDA under an Emergency Use Authorization (EUA). This EUA will remain in effect (meaning this test can be used) for the duration of the COVID-19 declaration under Section 564(b)(1) of the Act, 21 U.S.C. section 360bbb-3(b)(1), unless the authorization is terminated or revoked.  Performed at [facility], [place] [address], [place], [place] [address]   Culture, blood (routine x 2)     Status: None (Preliminary result)   Collection Time: 10/20/22  8:40 PM   Specimen: BLOOD  Result Value Ref Range Status   Specimen Description BLOOD RIGHT ANTECUBITAL  Final   Special Requests   Final    BOTTLES DRAWN AEROBIC AND ANAEROBIC Blood Culture adequate volume   Culture  Setup Time   Final    GRAM POSITIVE COCCI IN CHAINS GRAM NEGATIVE RODS IN BOTH AEROBIC AND ANAEROBIC BOTTLES CRITICAL RESULT CALLED TO, READ BACK BY AND VERIFIED WITH: PHARMD J.MILLEN AT 5790 ON 10/21/2022 BY T.SAAD. Performed at [facility], [place] [address] [place], [place] [address]    Culture GRAM POSITIVE COCCI GRAM NEGATIVE RODS   Final   Report Status PENDING  Incomplete  Culture, blood (routine x 2)     Status: None (Preliminary result)   Collection Time: 10/20/22  8:40 PM   Specimen: BLOOD  Result Value Ref Range Status   Specimen Description BLOOD SITE NOT SPECIFIED  Final   Special Requests   Final    BOTTLES DRAWN AEROBIC AND ANAEROBIC Blood Culture results may not be optimal due to an inadequate volume of  blood received in culture bottles   Culture  Setup Time   Final    GRAM POSITIVE COCCI GRAM NEGATIVE RODS IN BOTH AEROBIC AND ANAEROBIC BOTTLES CRITICAL VALUE NOTED.  VALUE IS CONSISTENT WITH PREVIOUSLY REPORTED AND CALLED VALUE. Performed at [facility], [place] [address], [place], [place] [address]    Culture Lonell Grandchild NEGATIVE RODS [facility] POSITIVE COCCI   Final   Report Status PENDING  Incomplete  Blood Culture ID Panel (Reflexed)     Status: Abnormal   Collection Time: 10/20/22  8:40 PM  Result Value Ref Range Status   Enterococcus faecalis DETECTED (  A) NOT DETECTED Final    Comment: CRITICAL RESULT CALLED TO, READ BACK BY AND VERIFIED WITH: PHARMD J.MILLEN AT 2440 ON 10/21/2022 BY T.SAAD.    Enterococcus Faecium NOT DETECTED NOT DETECTED Final   Listeria monocytogenes NOT DETECTED NOT DETECTED Final   Staphylococcus species NOT DETECTED NOT DETECTED Final   Staphylococcus aureus (BCID) NOT DETECTED NOT DETECTED Final   Staphylococcus epidermidis NOT DETECTED NOT DETECTED Final   Staphylococcus lugdunensis NOT DETECTED NOT DETECTED Final   Streptococcus species NOT DETECTED NOT DETECTED Final   Streptococcus agalactiae NOT DETECTED NOT DETECTED Final   Streptococcus pneumoniae NOT DETECTED NOT DETECTED Final   Streptococcus pyogenes NOT DETECTED NOT DETECTED Final   A.calcoaceticus-baumannii NOT DETECTED NOT DETECTED Final   Bacteroides fragilis DETECTED (A) NOT DETECTED Final    Comment: CRITICAL RESULT CALLED TO, READ BACK BY AND VERIFIED WITH: PHARMD J.MILLEN AT 1027 ON 10/21/2022 BY T.SAAD.    Enterobacterales DETECTED (A) NOT DETECTED Final    Comment: Enterobacterales represent a large order of gram negative bacteria, not a single organism. CRITICAL RESULT CALLED TO, READ BACK BY AND VERIFIED WITH: PHARMD J.MILLEN AT 2536 ON 10/21/2022 BY T.SAAD.    Enterobacter cloacae complex NOT DETECTED NOT DETECTED Final   Escherichia coli NOT DETECTED NOT DETECTED Final    Klebsiella aerogenes NOT DETECTED NOT DETECTED Final   Klebsiella oxytoca NOT DETECTED NOT DETECTED Final   Klebsiella pneumoniae NOT DETECTED NOT DETECTED Final   Proteus species DETECTED (A) NOT DETECTED Final    Comment: CRITICAL RESULT CALLED TO, READ BACK BY AND VERIFIED WITH: PHARMD J.MILLEN AT 6440 ON 10/21/2022 BY T.SAAD.    Salmonella species NOT DETECTED NOT DETECTED Final   Serratia marcescens NOT DETECTED NOT DETECTED Final   Haemophilus influenzae NOT DETECTED NOT DETECTED Final   Neisseria meningitidis NOT DETECTED NOT DETECTED Final   Pseudomonas aeruginosa NOT DETECTED NOT DETECTED Final   Stenotrophomonas maltophilia NOT DETECTED NOT DETECTED Final   Candida albicans NOT DETECTED NOT DETECTED Final   Candida auris NOT DETECTED NOT DETECTED Final   Candida glabrata NOT DETECTED NOT DETECTED Final   Candida krusei NOT DETECTED NOT DETECTED Final   Candida parapsilosis NOT DETECTED NOT DETECTED Final   Candida tropicalis NOT DETECTED NOT DETECTED Final   Cryptococcus neoformans/gattii NOT DETECTED NOT DETECTED Final   CTX-M ESBL NOT DETECTED NOT DETECTED Final   Carbapenem resistance IMP NOT DETECTED NOT DETECTED Final   Carbapenem resistance KPC NOT DETECTED NOT DETECTED Final   Carbapenem resistance NDM NOT DETECTED NOT DETECTED Final   Carbapenem resist OXA 48 LIKE NOT DETECTED NOT DETECTED Final   Vancomycin resistance NOT DETECTED NOT DETECTED Final   Carbapenem resistance VIM NOT DETECTED NOT DETECTED Final    Comment: Performed at [facility], [address] [place], [place] [address]     Terri Piedra, [facility]  10/22/2022  8:31 AM

## 2022-10-22 NOTE — Progress Notes (Signed)
Patient ID: Autumn Dean, female   DOB: Oct 24, 1933, 88 y.o.   MRN: [id number] Patient too lethargic to eat this morning.   Haydee Salter, RN

## 2022-10-22 NOTE — Progress Notes (Signed)
Autumn Dean  EZM:[id number] DOB: [birth year] DOA: 10/20/2022 PCP: Glenda Chroman, MD    Brief Narrative:  88 year old with a history of dementia, PAF on Eliquis, UC, chronic diastolic CHF, chronic indwelling Foley catheter, ESBL Klebsiella bacteremia with complicated UTIs, and GERD who presented from her SNF with hypotension, decreased hemoglobin, and elevated WBC.  She was found to have SBP in the 80s on EMS evaluation.  At presentation her WBC was 25 and hemoglobin was 11.1.  UA was grossly positive with large leukocytes, positive nitrite, and greater than 50 WBCs with many bacteria.  She was also noted to have a pressure ulcer, and tested positive for COVID.  Consultants:  None  Goals of Care:  Code Status: DNR   DVT prophylaxis: Eliquis  Interim Hx: Afebrile.  Vital signs stable.  Alert but not able to provide a reliable history.  Does not appear uncomfortable.  Reports that she has very little appetite.  Assessment & Plan:  UTI with sepsis POA Met sepsis criteria with significant clinical illness associated with tachycardia tachypnea and leukocytosis in setting of a UTI -history of ESBL Klebsiella infections  Polymicrobial bacteremia Blood culture positive for gram-positive cocci as well as gram-negative rods -ID following  Hypotension Due to acute bacterial infection, sepsis, and volume depletion  Sacral decubitus ulcer POA As per wound care  COVID-positive No symptoms directly attributable to COVID at present -do not feel that specific treatment of this issue is indicated for this patient  Normocytic anemia Appears to be chronic in nature  Hyponatremia Due to volume depletion -improving with volume resuscitation  Chronic diastolic CHF Hypovolemic at presentation  Chronic severe dementia  Chronic paroxysmal atrial fibrillation   Family Communication: No family present Disposition:     Objective: Blood pressure 112/70, pulse 67, temperature 98.1 F  (36.7 C), temperature source Oral, resp. rate 15, height _0  (1.6 m), weight 54.4 kg, SpO2 100 %.  Intake/Output Summary (Last 24 hours) at 10/22/2022 1052 Last data filed at 10/22/2022 0736 Gross per 24 hour  Intake 378.1 ml  Output --  Net 378.1 ml   Filed Weights   10/20/22 1945  Weight: 54.4 kg    Examination: General: No acute respiratory distress Lungs: Clear to auscultation bilaterally without wheezes or crackles Cardiovascular: Regular rate and rhythm without murmur gallop or rub normal S1 and S2 Abdomen: Nontender, nondistended, soft, bowel sounds positive, no rebound, no ascites, no appreciable mass Extremities: No significant cyanosis, clubbing, or edema bilateral lower extremities  CBC: Recent Labs  Lab 10/20/22 2040 10/20/22 2101 10/21/22 0425 10/22/22 0239  WBC 25.2*  --  21.9* 21.7*  NEUTROABS 23.1*  --   --   --   HGB 11.1* 11.2* 9.6* 9.2*  HCT 32.9* 33.0* 28.3* 28.4*  MCV 94.3  --  94.3 96.9  PLT 375  --  312 503   Basic Metabolic Panel: Recent Labs  Lab 10/20/22 2040 10/20/22 2101 10/21/22 0425 10/22/22 0239  NA 129* 128* 132* 130*  K 4.8 4.6 4.3 4.0  CL 97* 97* 103 103  CO2 22  --  21* 18*  GLUCOSE 124* 119* 95 94  BUN 34* 31* 29* 20  CREATININE 0.95 0.80 0.73 0.66  CALCIUM 10.1  --  9.3 9.7   GFR: Estimated Creatinine Clearance: 41 mL/min (by C-G formula based on SCr of 0.66 mg/dL).   Scheduled Meds:  acetaminophen  1,000 mg Oral BID   apixaban  2.5 mg Oral BID   busPIRone  5 mg Oral BID   Chlorhexidine Gluconate Cloth  6 each Topical Daily   cholecalciferol  1,000 Units Oral q morning   cyanocobalamin  1,000 mcg Oral q morning   folic acid  1 mg Oral Daily   iohexol  500 mL Oral Q1H   leptospermum manuka honey  1 Application Topical Daily   molnupiravir EUA  4 capsule Oral BID   pantoprazole  40 mg Oral Daily   sulfaSALAzine  500 mg Oral TID   Continuous Infusions:  meropenem (MERREM) IV 1 g (10/22/22 0915)   vancomycin        LOS: 2 days   Cherene Altes, MD [facility] Office  [phone] Pager - Text Page per Shea Evans  If 7PM-7AM, please contact night-coverage per Amion 10/22/2022, 10:52 AM

## 2022-10-23 ENCOUNTER — Inpatient Hospital Stay (HOSPITAL_COMMUNITY): Payer: Medicare PPO

## 2022-10-23 DIAGNOSIS — R627 Adult failure to thrive: Secondary | ICD-10-CM

## 2022-10-23 DIAGNOSIS — Z7189 Other specified counseling: Secondary | ICD-10-CM

## 2022-10-23 DIAGNOSIS — Z515 Encounter for palliative care: Secondary | ICD-10-CM

## 2022-10-23 DIAGNOSIS — A419 Sepsis, unspecified organism: Secondary | ICD-10-CM | POA: Diagnosis not present

## 2022-10-23 DIAGNOSIS — N390 Urinary tract infection, site not specified: Secondary | ICD-10-CM | POA: Diagnosis not present

## 2022-10-23 DIAGNOSIS — R7881 Bacteremia: Secondary | ICD-10-CM | POA: Diagnosis not present

## 2022-10-23 LAB — COMPREHENSIVE METABOLIC PANEL
ALT: 37 U/L (ref 0–44)
AST: 52 U/L — ABNORMAL HIGH (ref 15–41)
Albumin: 1.9 g/dL — ABNORMAL LOW (ref 3.5–5.0)
Alkaline Phosphatase: 86 U/L (ref 38–126)
Anion gap: 9 (ref 5–15)
BUN: 24 mg/dL — ABNORMAL HIGH (ref 8–23)
CO2: 18 mmol/L — ABNORMAL LOW (ref 22–32)
Calcium: 10 mg/dL (ref 8.9–10.3)
Chloride: 103 mmol/L (ref 98–111)
Creatinine, Ser: 0.85 mg/dL (ref 0.44–1.00)
GFR, Estimated: >60 mL/min (ref >60)
Glucose, Bld: 128 mg/dL — ABNORMAL HIGH (ref 70–99)
Potassium: 3.9 mmol/L (ref 3.5–5.1)
Sodium: 130 mmol/L — ABNORMAL LOW (ref 135–145)
Total Bilirubin: 0.5 mg/dL (ref 0.3–1.2)
Total Protein: 5 g/dL — ABNORMAL LOW (ref 6.5–8.1)

## 2022-10-23 LAB — MAGNESIUM: Magnesium: 1.7 mg/dL (ref 1.7–2.4)

## 2022-10-23 LAB — CBC
HCT: 32.1 % — ABNORMAL LOW (ref 36.0–46.0)
Hemoglobin: 10.2 g/dL — ABNORMAL LOW (ref 12.0–15.0)
MCH: 31.8 pg (ref 26.0–34.0)
MCHC: 31.8 g/dL (ref 30.0–36.0)
MCV: 100 fL (ref 80.0–100.0)
Platelets: 334 10*3/uL (ref 150–400)
RBC: 3.21 MIL/uL — ABNORMAL LOW (ref 3.87–5.11)
RDW: 17.2 % — ABNORMAL HIGH (ref 11.5–15.5)
WBC: 24.6 10*3/uL — ABNORMAL HIGH (ref 4.0–10.5)
nRBC: 0 % (ref 0.0–0.2)

## 2022-10-23 LAB — ECHOCARDIOGRAM COMPLETE
Height: 63 in
S' Lateral: 2.1 cm
Weight: 1920 oz

## 2022-10-23 LAB — VITAMIN B12: Vitamin B-12: 1497 pg/mL — ABNORMAL HIGH (ref 180–914)

## 2022-10-23 LAB — AMMONIA: Ammonia: 32 umol/L (ref 9–35)

## 2022-10-23 LAB — FOLATE: Folate: 17.4 ng/mL (ref >5.9)

## 2022-10-23 MED ORDER — ONDANSETRON 4 MG PO TBDP: 4.0000 mg | ORAL_TABLET | Freq: Four times a day (QID) | ORAL | Status: DC | PRN

## 2022-10-23 MED ORDER — GLYCOPYRROLATE 0.2 MG/ML IJ SOLN: 0.2000 mg | INTRAMUSCULAR | Status: DC | PRN

## 2022-10-23 MED ORDER — LORAZEPAM 1 MG PO TABS: 1.0000 mg | ORAL_TABLET | ORAL | Status: DC | PRN

## 2022-10-23 MED ORDER — LORAZEPAM 2 MG/ML PO CONC: 1.0000 mg | ORAL | Status: DC | PRN

## 2022-10-23 MED ORDER — BIOTENE DRY MOUTH MT LIQD
15.0000 mL | Freq: Three times a day (TID) | OROMUCOSAL | Status: DC
  Administered 2022-10-23 – 2022-10-24 (×2): 15 mL via TOPICAL

## 2022-10-23 MED ORDER — OXYCODONE HCL 20 MG/ML PO CONC: 5.0000 mg | ORAL | Status: DC | PRN

## 2022-10-23 MED ORDER — GLYCOPYRROLATE 1 MG PO TABS: 1.0000 mg | ORAL_TABLET | ORAL | Status: DC | PRN

## 2022-10-23 MED ORDER — HALOPERIDOL LACTATE 5 MG/ML IJ SOLN: 2.0000 mg | INTRAMUSCULAR | Status: DC | PRN

## 2022-10-23 MED ORDER — HALOPERIDOL 1 MG PO TABS: 2.0000 mg | ORAL_TABLET | ORAL | Status: DC | PRN

## 2022-10-23 MED ORDER — HALOPERIDOL LACTATE 2 MG/ML PO CONC: 2.0000 mg | ORAL | Status: DC | PRN

## 2022-10-23 MED ORDER — LORAZEPAM 2 MG/ML IJ SOLN
1.0000 mg | INTRAMUSCULAR | Status: DC | PRN
  Administered 2022-10-24: 1 mg via INTRAVENOUS
  Filled 2022-10-23: qty 1

## 2022-10-23 MED ORDER — ONDANSETRON HCL 4 MG/2ML IJ SOLN: 4.0000 mg | Freq: Four times a day (QID) | INTRAMUSCULAR | Status: DC | PRN

## 2022-10-23 MED ORDER — ACETAMINOPHEN 160 MG/5ML PO SOLN
650.0000 mg | Freq: Four times a day (QID) | ORAL | Status: DC | PRN
  Administered 2022-10-23: 650 mg via ORAL
  Filled 2022-10-23: qty 20.3

## 2022-10-23 MED ORDER — POLYVINYL ALCOHOL 1.4 % OP SOLN: 1.0000 [drp] | Freq: Four times a day (QID) | OPHTHALMIC | Status: DC | PRN

## 2022-10-23 MED ORDER — SODIUM CHLORIDE 0.9 % IV SOLN
3.0000 g | Freq: Four times a day (QID) | INTRAVENOUS | Status: DC
  Administered 2022-10-23: 3 g via INTRAVENOUS
  Filled 2022-10-23: qty 8

## 2022-10-23 NOTE — Progress Notes (Addendum)
Patient  had a  foley but there was no urine out put noted in foley bag and it was leacking from the side of the foley cathether. We tried to replace with another RN but cound not find the right spot,unsuccessful attempts X 2. Patient is incontinent to bowel and bladder (see flowsheet). ON call provider A. [place] notified, waiting for the response. Day shift RN notified for further follow up.

## 2022-10-23 NOTE — Progress Notes (Signed)
Autumn Dean  VEH:[id number] DOB: 27-Aug-1933 DOA: 10/20/2022 PCP: Glenda Chroman, MD    Brief Narrative:  88 year old with a history of dementia, PAF on Eliquis, UC, chronic diastolic CHF, chronic indwelling Foley catheter, ESBL Klebsiella bacteremia with complicated UTIs, and GERD who presented from her SNF with hypotension, decreased hemoglobin, and elevated WBC.  She was found to have SBP in the 80s on EMS evaluation.  At presentation her WBC was 25 and hemoglobin was 11.1.  UA was grossly positive with large leukocytes, positive nitrite, and greater than 50 WBCs with many bacteria.  She was also noted to have a pressure ulcer, and tested positive for COVID.  Consultants:  None  Goals of Care:  Code Status: DNR   DVT prophylaxis: Eliquis  Interim Hx: Palliative care met with the patient's family today again the decision has been made to transition to full comfort focused care.  I fully support this approach and feel it is most appropriate for her.  Assessment & Plan:  Proteus UTI with sepsis POA Met sepsis criteria with significant clinical illness associated with tachycardia tachypnea and leukocytosis in setting of a UTI -history of ESBL Klebsiella infections  Proteus and Enterococcus bacteremia ID directed antibiotic choice  -TTE accomplished and was w/o frank evidence of endocarditis   Hypotension Due to acute bacterial infection, sepsis, and volume depletion - resolved with volume expansion and antibiotic initiation  Sacral decubitus ulcer POA As per wound care  COVID-positive No symptoms directly attributable to COVID at present - do not feel that specific treatment of this issue is indicated for this patient  Normocytic anemia Appears to be chronic in nature - hemoglobin is stable  Hyponatremia Due to volume depletion -improving with volume resuscitation  Chronic diastolic CHF Hypovolemic at presentation  Chronic severe dementia Unchanged  Chronic  paroxysmal atrial fibrillation Heart rate controlled -chronically on Eliquis which is being continued for now  Goals of Care Palliative Care consult requested - transitioning to full comfort care today   Family Communication: No family present Disposition:  full comfort care - awaiting [facility] bed    Objective: Blood pressure 121/75, pulse 92, temperature 98.9 F (37.2 C), temperature source Axillary, resp. rate 17, height _0  (1.6 m), weight 54.4 kg, SpO2 99 %.  Intake/Output Summary (Last 24 hours) at 10/23/2022 1015 Last data filed at 10/23/2022 0618 Gross per 24 hour  Intake 976.43 ml  Output --  Net 976.43 ml    Filed Weights   10/20/22 1945  Weight: 54.4 kg    Examination: General: No acute respiratory distress - sedate - comfortable  Lungs: Clear to auscultation bilaterally   CBC: Recent Labs  Lab 10/20/22 2040 10/20/22 2101 10/21/22 0425 10/22/22 0239 10/23/22 0348  WBC 25.2*  --  21.9* 21.7* 24.6*  NEUTROABS 23.1*  --   --   --   --   HGB 11.1*   < > 9.6* 9.2* 10.2*  HCT 32.9*   < > 28.3* 28.4* 32.1*  MCV 94.3  --  94.3 96.9 100.0  PLT 375  --  312 307 334   < > = values in this interval not displayed.    Basic Metabolic Panel: Recent Labs  Lab 10/21/22 0425 10/22/22 0239 10/23/22 0348  NA 132* 130* 130*  K 4.3 4.0 3.9  CL 103 103 103  CO2 21* 18* 18*  GLUCOSE 95 94 128*  BUN 29* 20 24*  CREATININE 0.73 0.66 0.85  CALCIUM 9.3 9.7 10.0  MG  --   --  1.7    GFR: Estimated Creatinine Clearance: 38.6 mL/min (by C-G formula based on SCr of 0.85 mg/dL).   Scheduled Meds:  acetaminophen  1,000 mg Oral BID   apixaban  2.5 mg Oral BID   busPIRone  5 mg Oral BID   Chlorhexidine Gluconate Cloth  6 each Topical Daily   cholecalciferol  1,000 Units Oral q morning   cyanocobalamin  1,000 mcg Oral q morning   folic acid  1 mg Oral Daily   leptospermum manuka honey  1 Application Topical Daily   molnupiravir EUA  4 capsule Oral BID    pantoprazole  40 mg Oral Daily   polyethylene glycol  17 g Oral BID   sulfaSALAzine  500 mg Oral TID   Continuous Infusions:  meropenem (MERREM) IV 1 g (10/22/22 2255)   vancomycin 750 mg (10/22/22 1851)     LOS: 3 days   Cherene Altes, MD [facility] Office  [phone] Pager - Text Page per Shea Evans  If 7PM-7AM, please contact night-coverage per Amion 10/23/2022, 10:15 AM

## 2022-10-23 NOTE — Consult Note (Addendum)
Consultation Note Date: 10/23/2022   Patient Name: Autumn Dean  DOB: 07-Oct-1933  MRN: [id number]  Age / Sex: 88 y.o., female  PCP: Glenda Chroman, MD Referring Physician: Cherene Altes, MD  Reason for Consultation: Establishing goals of care  HPI/Patient Profile: 88 y.o. female  with past medical history of dementia, PAF on Eliquis, UC, chronic diastolic CHF, chronic indwelling Foley catheter, ESBL Klebsiella bacteremia with complicated UTIs, and GERD  admitted on 10/20/2022 with urosepsis.  Found to have polymicrobial bacteremia.  Also found to be COVID-positive but asymptomatic.  PMT consulted to discuss goals of care.  Clinical Assessment and Goals of Care: I have reviewed medical records including EPIC notes, labs and imaging, assessed the patient and then spoke with her nephew Catalina Antigua to discuss diagnosis prognosis, GOC, EOL wishes, disposition and options.  Patient's speech is incomprehensible and she is disoriented.  Unable to participate in goals of care discussions.  While at the bedside I attempted to assist patient with eating or drinking but she refused all of my attempts.  I introduced Palliative Medicine as specialized medical care for people living with serious illness. It focuses on providing relief from the symptoms and stress of a serious illness. The goal is to improve quality of life for both the patient and the family.  We discussed a brief life review of the patient.  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  Tells me patient named him as HCPOA when she was of sound mind.   We discussed patient's current illness and what it means in the larger context of patient's on-going co-morbidities.  Natural disease trajectory and expectations at EOL were discussed.  We discussed patient's ongoing encephalopathy and poor p.o. intake.  I attempted to elicit values and goals of care important to the patient.    The  difference between aggressive medical intervention and comfort care was considered in light of the patient's goals of care.   Not shares patient had poor quality of life at baseline and he does not think continuing aggressive medical care would benefit her.  Measures he would like to transition to focus on her comfort only.  He expresses his understanding that she is near end-of-life and we will ensure she is comfort through what ever time is left.  We discussed hospice philosophy of care.  That would agree to transfer to hospice facility.  Questions and concerns were addressed. The family was encouraged to call with questions or concerns.  Primary Decision Maker HCPOA -nephew [name]    SUMMARY OF RECOMMENDATIONS   Transition to comfort measures only: no aggressive medical interventions not necessary to promote comfort, add PRN ativan, PRN morphine, PRN haldol, PRN robinul To hospice facility when bed available, TOC involved  Code Status/Advance Care Planning: DNR  Prognosis:  < 2 weeks  Discharge Planning: Hospice facility      Primary Diagnoses: Present on Admission:  Sepsis secondary to UTI ([place])  Hypotension  Paroxysmal atrial fibrillation ([place])  Dementia without behavioral disturbance ([place])  Chronic diastolic CHF (congestive heart failure) ([place])   I have reviewed the medical record, interviewed the patient and family, and examined the patient. The following aspects are pertinent.  Past Medical History:  Diagnosis Date   GERD (gastroesophageal reflux disease)    Hiatal hernia    Hypertension    Proctitis    UC (ulcerative colitis confined to rectum) [facility])    Social History   Socioeconomic History   Marital status: Single  Spouse name: Not on file   Number of children: 2   Years of education: 91   Highest education level: 12th grade  Occupational History   Not on file  Tobacco Use   Smoking status: Never    Passive exposure: Never   Smokeless tobacco:  Never   Tobacco comments:    Verified by [employer] ([place]) - [place] Use   Vaping Use: Never used  Substance and Sexual Activity   Alcohol use: No    Alcohol/week: 0.0 standard drinks of alcohol   Drug use: No   Sexual activity: Not Currently    Partners: Male  Other Topics Concern   Not on file  Social History Narrative   Not on file   Social Determinants of Health   Financial Resource Strain: Low Risk  (09/22/2022)   Overall Financial Resource Strain (CARDIA)    Difficulty of Paying Living Expenses: Not hard at all  Food Insecurity: No Food Insecurity (09/22/2022)   Hunger Vital Sign    Worried About Running Out of Food in the Last Year: Never true    [place] in the Last Year: Never true  Transportation Needs: No Transportation Needs (09/22/2022)   PRAPARE - Hydrologist (Medical): No    Lack of Transportation (Non-Medical): No  Physical Activity: Inactive (09/22/2022)   Exercise Vital Sign    Days of Exercise per Week: 0 days    Minutes of Exercise per Session: 0 min  Stress: No Stress Concern Present (09/22/2022)   [place]    Feeling of Stress : Not at all  Social Connections: Unknown (09/22/2022)   Social Connection and Isolation Panel [NHANES]    Frequency of Communication with Friends and Family: More than three times a week    Frequency of Social Gatherings with Friends and Family: More than three times a week    Attends Religious Services: 1 to 4 times per year    Active Member of [employer] or Organizations: No    Attends Music therapist: Never    Marital Status: Not on file   Family History  Problem Relation Age of Onset   Colon cancer Neg Hx    Scheduled Meds:  acetaminophen  1,000 mg Oral BID   antiseptic oral rinse  15 mL Topical TID   leptospermum manuka honey  1 Application Topical Daily   polyethylene  glycol  17 g Oral BID   Continuous Infusions: PRN Meds:.glycopyrrolate **OR** glycopyrrolate **OR** glycopyrrolate, haloperidol **OR** haloperidol **OR** haloperidol lactate, LORazepam **OR** LORazepam **OR** LORazepam, ondansetron **OR** ondansetron (ZOFRAN) IV, oxyCODONE **OR** oxyCODONE, polyvinyl alcohol Allergies  Allergen Reactions   Macrobid [Nitrofurantoin Macrocrystal] Other (See Comments)    Extreme dizziness   Ciprofloxacin Nausea And Vomiting   Nitrofurantoin Nausea And Vomiting   Butalbital-Asa-Caff-Codeine Itching, Rash and Other (See Comments)    unk   Codeine Itching and Rash    Codeine-butalbital-ASA-caff    Demerol Rash   Latex Rash    Redness   Review of Systems  Unable to perform ROS: Dementia    Physical Exam Constitutional:      General: She is not in acute distress.    Appearance: She is ill-appearing.     Comments: Briefly opens eyes to voice but quickly falls back to sleep.  Speech incomprehensible.  Pulmonary:     Effort: Pulmonary effort is normal.  Skin:  General: Skin is warm and dry.  Neurological:     Mental Status: She is disoriented.     Vital Signs: BP 121/75 (BP Location: Left Arm)   Pulse 92   Temp 98.9 F (37.2 C) (Axillary)   Resp 17   Ht 5' 3"  (1.6 m)   Wt 54.4 kg   SpO2 99%   BMI 21.26 kg/m  Pain Scale: Faces   Pain Score: 0-No pain   SpO2: SpO2: 99 % O2 Device:SpO2: 99 % O2 Flow Rate: .   IO: Intake/output summary:  Intake/Output Summary (Last 24 hours) at 10/23/2022 1500 Last data filed at 10/23/2022 8889 Gross per 24 hour  Intake 976.43 ml  Output --  Net 976.43 ml    LBM: Last BM Date : 10/23/22 Baseline Weight: Weight: 54.4 kg Most recent weight: Weight: 54.4 kg     Palliative Assessment/Data: PPS 10%     *Please note that this is a verbal dictation therefore any spelling or grammatical errors are due to the "[place] [employer]" system interpretation.   Juel Burrow, DNP, AGNP-C Palliative  Medicine Team [phone] Pager: [phone]

## 2022-10-23 NOTE — Progress Notes (Signed)
Manufacturing engineer [facility]) Hospital Liaison Note  Referral received for patient/family interest in [facility]. Chart under review by [facility] physician.   Hospice eligibility pending.   Unfortunately, no beds available today at [facility]. Patient will be evaluated tomorrow for eligibility.   Please call with any questions or concerns. Thank you  Roselee Nova, [place] [facility] Liaison [phone]

## 2022-10-23 NOTE — Progress Notes (Signed)
  Echocardiogram 2D Echocardiogram has been performed.  Autumn Dean 10/23/2022, 10:04 AM

## 2022-10-23 NOTE — TOC Initial Note (Signed)
Transition of Care (TOC) - Initial/Assessment Note   Received message from Palliative Team for consult for residential hospice.   Spoke to nephrew [name] via phone and confirmed. Offered choice. He prefers [facility].   Referral made to [facility] with [place]   Await determination.  Patient Details  Name: LATRICA CLOWERS MRN: [id number] Date of Birth: [birth year]  Transition of Care [facility]) CM/SW Contact:    Marilu Favre, RN Phone Number: 10/23/2022, 12:50 PM  Clinical Narrative:                   Planned Disposition: Residential Hospice     Patient Goals and CMS Choice   CMS Medicare.gov Compare Post Acute Care list provided to:: Other (Comment Required) (nephrew [name]) Choice offered to / list presented to :  (nephrew [name])      Expected Discharge Plan and Services Planned Disposition: Residential Hospice   Discharge Planning Services: CM Consult Post Acute Care Choice: Residential Hospice Bed Living arrangements for the past 2 months: [place]                   DME Agency: NA                  Prior Living Arrangements/Services Living arrangements for the past 2 months: [place]                     Activities of Daily Living   ADL Screening (condition at time of admission) Is the patient deaf or have difficulty hearing?: No Does the patient have difficulty seeing, even when wearing glasses/contacts?: No Does the patient have difficulty concentrating, remembering, or making decisions?: Yes Does the patient have difficulty dressing or bathing?: Yes Does the patient have difficulty walking or climbing stairs?: Yes  Permission Sought/Granted      Share Information with NAME: nephrew YBOF [phone]           Emotional Assessment              Admission diagnosis:  Sepsis ([place]) [A41.9] Urinary tract infection without hematuria, site unspecified [N39.0] Leukocytosis, unspecified type  [D72.829] Patient Active Problem List   Diagnosis Date Noted   Polymicrobial bacterial infection 10/22/2022   Bacteremia 10/22/2022   Constipation 85/27/7824   Metabolic encephalopathy 23/53/6144   Sepsis secondary to UTI ([place]) 10/20/2022   Hyponatremia 10/20/2022   Anemia 10/20/2022   Sacral ulcer ([place]) 10/20/2022   Hypotension 12/16/2021   Urinary tract infection 12/16/2021   AKI (acute kidney injury) ([place]) 12/16/2021   Paroxysmal atrial fibrillation ([place]) 12/16/2021   Generalized weakness 12/16/2021   Dementia without behavioral disturbance ([place]) 12/16/2021   Chronic diastolic CHF (congestive heart failure) ([place]) 12/16/2021   Elevated troponin 12/16/2021   Chronic cystitis 06/29/2020   Left sided ulcerative colitis ([place]) 04/19/2019   Acute right-sided low back pain 04/16/2017   Tricuspid regurgitation 08/01/2014   Mitral regurgitation 07/08/2013   GERD (gastroesophageal reflux disease) 12/04/2011   UC (ulcerative colitis confined to rectum) ([place]) 12/04/2011   Hypertension 12/04/2011   PCP:  Glenda Chroman, MD Pharmacy:   [facility], [place] - [address] [place] [address] [place] [address] Phone: [phone] Fax: [phone]     Social Determinants of Health (SDOH) Social History: SDOH Screenings   Food Insecurity: No Food Insecurity (09/22/2022)  Housing: Low Risk  (09/22/2022)  Transportation Needs: No Transportation Needs (09/22/2022)  Utilities: Not  At Risk (09/22/2022)  Alcohol Screen: Low Risk  (09/22/2022)  Depression (PHQ2-9): Low Risk  (09/22/2022)  Financial Resource Strain: Low Risk  (09/22/2022)  Physical Activity: Inactive (09/22/2022)  Social Connections: Unknown (09/22/2022)  Stress: No Stress Concern Present (09/22/2022)  Tobacco Use: Low Risk  (10/20/2022)   SDOH Interventions:     Readmission Risk Interventions     No data to display

## 2022-10-23 NOTE — Progress Notes (Signed)
Subjective: Patient and her family had met with palliative care and is transitioning to comfort measure   Objective: Vitals:   10/22/22 2117 10/23/22 0459  BP: 126/78 121/75  Pulse: 92   Resp: 17 17  Temp: 98.3 F (36.8 C) 98.9 F (37.2 C)  SpO2: 97% 99%   Patient appears more comfortable today and is verbal but we are not able to make out through her intangible speech vs expressive aphasia Heent: normocephalic Abd: less tender  Ext no edema Neuro: generalized weakness Skin: no rash  No foley today   Labs: Reviewed  A/p Dementia Polymicrobial bacteremia Constipation Urine rention chronic   Patient had transitioned to comfort care. Agree can stop abx Appreciate involving id in patient's care

## 2022-10-24 DIAGNOSIS — Z515 Encounter for palliative care: Secondary | ICD-10-CM | POA: Diagnosis not present

## 2022-10-24 DIAGNOSIS — Z7189 Other specified counseling: Secondary | ICD-10-CM | POA: Diagnosis not present

## 2022-10-24 DIAGNOSIS — N390 Urinary tract infection, site not specified: Secondary | ICD-10-CM | POA: Diagnosis not present

## 2022-10-24 DIAGNOSIS — A419 Sepsis, unspecified organism: Secondary | ICD-10-CM | POA: Diagnosis not present

## 2022-10-24 LAB — CULTURE, BLOOD (ROUTINE X 2): Special Requests: ADEQUATE

## 2022-10-24 MED ORDER — OXYCODONE HCL 5 MG PO TABS: 5.0000 mg | ORAL_TABLET | ORAL | 0 refills | Status: AC | PRN

## 2022-10-24 MED ORDER — POLYVINYL ALCOHOL 1.4 % OP SOLN: 1.0000 [drp] | Freq: Four times a day (QID) | OPHTHALMIC | 0 refills | Status: AC | PRN

## 2022-10-24 MED ORDER — POLYETHYLENE GLYCOL 3350 17 G PO PACK: 17.0000 g | PACK | Freq: Two times a day (BID) | ORAL | 0 refills | Status: AC

## 2022-10-24 MED ORDER — HALOPERIDOL LACTATE 2 MG/ML PO CONC: 2.0000 mg | ORAL | 0 refills | Status: DC | PRN

## 2022-10-24 MED ORDER — ATROPINE SULFATE 1 % OP SOLN: 1.0000 [drp] | Freq: Four times a day (QID) | OPHTHALMIC | Status: DC | PRN

## 2022-10-24 MED ORDER — BIOTENE DRY MOUTH MT LIQD: 15.0000 mL | OROMUCOSAL | Status: DC | PRN

## 2022-10-24 MED ORDER — LORAZEPAM 2 MG/ML IJ SOLN: 1.0000 mg | INTRAMUSCULAR | 0 refills | Status: DC | PRN

## 2022-10-24 MED ORDER — BIOTENE DRY MOUTH MT LIQD: 15.0000 mL | OROMUCOSAL | 0 refills | Status: DC | PRN

## 2022-10-24 MED ORDER — OXYCODONE HCL 5 MG PO TABS: 5.0000 mg | ORAL_TABLET | ORAL | Status: DC | PRN

## 2022-10-24 MED ORDER — LORAZEPAM 2 MG/ML PO CONC: 1.0000 mg | ORAL | 0 refills | Status: DC | PRN

## 2022-10-24 MED ORDER — ONDANSETRON 4 MG PO TBDP: 4.0000 mg | ORAL_TABLET | Freq: Four times a day (QID) | ORAL | 0 refills | Status: AC | PRN

## 2022-10-24 MED ORDER — OXYCODONE HCL 20 MG/ML PO CONC: 6.0000 mg | ORAL | 0 refills | Status: DC | PRN

## 2022-10-24 MED ORDER — LORAZEPAM 1 MG PO TABS: 1.0000 mg | ORAL_TABLET | ORAL | 0 refills | Status: AC | PRN

## 2022-10-24 MED ORDER — ATROPINE SULFATE 1 % OP SOLN: 1.0000 [drp] | Freq: Four times a day (QID) | OPHTHALMIC | 12 refills | Status: AC | PRN

## 2022-10-24 MED ORDER — GLYCOPYRROLATE 0.2 MG/ML IJ SOLN: 0.2000 mg | INTRAMUSCULAR | 0 refills | Status: DC | PRN

## 2022-10-24 MED ORDER — HALOPERIDOL LACTATE 5 MG/ML IJ SOLN: 2.0000 mg | INTRAMUSCULAR | 0 refills | Status: DC | PRN

## 2022-10-24 MED ORDER — MEDIHONEY WOUND/BURN DRESSING EX PSTE: 1.0000 | PASTE | Freq: Every day | CUTANEOUS | Status: AC

## 2022-10-24 MED ORDER — BIOTENE DRY MOUTH MT LIQD: 15.0000 mL | OROMUCOSAL | Status: AC | PRN

## 2022-10-24 MED ORDER — HALOPERIDOL 2 MG PO TABS: 2.0000 mg | ORAL_TABLET | ORAL | 0 refills | Status: AC | PRN

## 2022-10-24 MED ORDER — ACETAMINOPHEN 160 MG/5ML PO SOLN: 650.0000 mg | Freq: Four times a day (QID) | ORAL | 0 refills | Status: AC | PRN

## 2022-10-24 NOTE — Progress Notes (Signed)
Gave report to Katliyn,LPN at [facility]. Pt will be discharge with foley.   [place] is here to transfer pt to [place]. Reinforce sacrum dressing. Dress pt in her gown.

## 2022-10-24 NOTE — Progress Notes (Signed)
   Palliative Medicine Inpatient Follow Up Note HPI: 88 y.o. female  with past medical history of dementia, PAF on Eliquis, UC, chronic diastolic CHF, chronic indwelling Foley catheter, ESBL Klebsiella bacteremia with complicated UTIs, and GERD  admitted on 10/20/2022 with urosepsis.  Found to have polymicrobial bacteremia.  Also found to be COVID-positive but asymptomatic.  PMT consulted to discuss goals of care.   Today's Discussion 10/24/2022  *Please note that this is a verbal dictation therefore any spelling or grammatical errors are due to the "[place] [employer]" system interpretation.  Chart reviewed inclusive of vital signs, progress notes, laboratory results, and diagnostic images. Per MAR review has received x1 dose of tylenol last night and one dose of ativan.   I met this morning with Autumn Dean at bedside. She is pleasantly confused and noted to be in no significant distress. She has warm extremities with palpable pulses. Breathing is even and non-labored.  Patient has no family present at bedside though nephew will be called this morning.  Plan for transition to [facility] once a bed is available.   Questions and concerns addressed/Palliative Support Provided.   Objective Assessment: Vital Signs Vitals:   10/24/22 0913 10/24/22 0914  BP:    Pulse: 96 98  Resp:    Temp:    SpO2: 100% 100%    Intake/Output Summary (Last 24 hours) at 10/24/2022 1159 Last data filed at 10/24/2022 0620 Gross per 24 hour  Intake 700 ml  Output 131 ml  Net 569 ml   Last Weight  Most recent update: 10/20/2022  7:45 PM    Weight  54.4 kg (120 lb)            Gen: Elderly Geriatric F in NAD HEENT: Dry mucous membranes CV: Regular rate and rhythm  PULM:  On RA, breathing is even and nonlabored ABD: soft/nontender  EXT: No edema  Neuro: Aware of self only  SUMMARY OF RECOMMENDATIONS   DNAR/DNI  Comfort Measures  Plan for transition to [employer] today  Ongoing  support  Billing based on MDM: High Problems Addressed: One acute or chronic illness or injury that poses a threat to life or bodily function  Amount and/or Complexity of Data: Category 3:Discussion of management or test interpretation with external physician/other qualified health care professional/appropriate source (not separately reported)  Risks: Parenteral controlled substances, Decision regarding hospitalization or escalation of hospital care, and Decision not to resuscitate or to de-escalate care because of poor prognosis ______________________________________________________________________________________ [place] Team Team Cell Phone: [phone] Please utilize secure chat with additional questions, if there is no response within 30 minutes please call the above phone number  Palliative Medicine Team providers are available by phone from 7am to 7pm daily and can be reached through the team cell phone.  Should this patient require assistance outside of these hours, please call the patient's attending physician.

## 2022-10-24 NOTE — Discharge Summary (Addendum)
DISCHARGE SUMMARY  ARLY SALMINEN  MR#: [id number]  DOB:Feb 07, 1933  Date of Admission: 10/20/2022 Date of Discharge: 10/24/2022  Attending Physician:Larnie Heart Hennie Duos, MD  Patient's ATF:TDDU, Costella Hatcher, MD  Disposition: D/C to SNF for hospice care   Discharge Diagnoses: Comfort Focused Care No Code Blue - DNR Proteus UTI with sepsis POA related to chronic foley catheter Proteus and Enterococcus bacteremia Hypotension Sacral decubitus ulcer POA COVID-positive - asymptomatic infection  Normocytic anemia Hyponatremia Chronic diastolic CHF Chronic severe dementia Chronic paroxysmal atrial fibrillation  Initial presentation: 88 year old with a history of dementia, PAF on Eliquis, UC, chronic diastolic CHF, chronic indwelling Foley catheter, ESBL Klebsiella bacteremia with complicated UTIs, and GERD who presented from her SNF with hypotension, decreased hemoglobin, and elevated WBC. She was found to have SBP in the 80s on EMS evaluation. At presentation her WBC was 25 and hemoglobin was 11.1. UA was grossly positive with large leukocytes, positive nitrite, and greater than 50 WBCs with many bacteria. She was also noted to have a pressure ulcer, and tested positive for COVID.   Hospital Course:  [place] met with the patient's family 12/21 and the decision was made to transition to comfort focused care -the medical team supports this decision wholeheartedly and feels that is absolutely the most appropriate treatment plan for this patient - she is not to be sent back to the hospital    Proteus UTI with sepsis POA Met sepsis criteria with significant clinical illness associated with tachycardia tachypnea and leukocytosis in setting of a UTI - history of ESBL Klebsiella infections   Proteus and Enterococcus bacteremia ID directed antibiotic choice  -TTE accomplished and was w/o frank evidence of endocarditis - no further w/u or tx planned w/ transition  to comfort care    Hypotension Due to acute bacterial infection, sepsis, and volume depletion - resolved with volume expansion and antibiotic initiation   Sacral decubitus ulcer POA Continue usual wound care for comfort    COVID-positive No symptoms directly attributable to COVID during this admission - did not feel that specific treatment of this issue is indicated for this patient - positive test date was 10/20/2022 therefore last day of true isolation per the usual outpatient protocol would be 10/25/22   Normocytic anemia Appears to be chronic in nature - hemoglobin is stable   Hyponatremia Due to volume depletion -improved with volume resuscitation   Chronic diastolic CHF Hypovolemic at presentation   Chronic severe dementia Unchanged   Chronic paroxysmal atrial fibrillation Heart rate controlled -chronically on Eliquis which is being continued for now    Allergies as of 10/24/2022       Reactions   Macrobid [nitrofurantoin Macrocrystal] Other (See Comments)   Extreme dizziness   Ciprofloxacin Nausea And Vomiting   Nitrofurantoin Nausea And Vomiting   Butalbital-asa-caff-codeine Itching, Rash, Other (See Comments)   unk   Codeine Itching, Rash   Codeine-butalbital-ASA-caff   Demerol Rash   Latex Rash   Redness        Medication List     STOP taking these medications    acetaminophen 500 MG tablet Commonly known as: TYLENOL Replaced by: acetaminophen 160 MG/5ML solution   busPIRone 10 MG tablet Commonly known as: BUSPAR   carvedilol 3.125 MG tablet Commonly known as: COREG   cholecalciferol 25 MCG (1000 UNIT) tablet Commonly known as: VITAMIN D3   Cranberry 450 MG Tabs   cyanocobalamin 1000 MCG tablet Commonly known as: VITAMIN B12   ertapenem 1  g injection Commonly known as: INVANZ   esomeprazole 20 MG capsule Commonly known as: NEXIUM   estradiol 0.1 MG/GM vaginal cream Commonly known as: ESTRACE   folic acid 1 MG tablet Commonly  known as: FOLVITE   HYDROcodone-acetaminophen 5-325 MG tablet Commonly known as: NORCO/VICODIN   lidocaine 4 %   lisinopril 10 MG tablet Commonly known as: ZESTRIL   memantine 5 MG tablet Commonly known as: NAMENDA   OVER THE COUNTER MEDICATION   PRESCRIPTION MEDICATION   psyllium 0.52 g capsule Commonly known as: REGULOID   senna-docusate 8.6-50 MG tablet Commonly known as: Senokot-S   sertraline 50 MG tablet Commonly known as: ZOLOFT   simvastatin 10 MG tablet Commonly known as: ZOCOR   sodium chloride 0.65 % Soln nasal spray Commonly known as: OCEAN   sulfaSALAzine 500 MG tablet Commonly known as: AZULFIDINE       TAKE these medications    acetaminophen 160 MG/5ML solution Commonly known as: TYLENOL Take 20.3 mLs (650 mg total) by mouth every 6 (six) hours as needed for mild pain, headache or fever. Replaces: acetaminophen 500 MG tablet   antiseptic oral rinse Liqd Apply 15 mLs topically as needed for dry mouth.   atropine 1 % ophthalmic solution Place 1-2 drops under the tongue 4 (four) times daily as needed (excess secretions).   haloperidol 2 MG tablet Commonly known as: HALDOL Take 1 tablet (2 mg total) by mouth every 3 (three) hours as needed for agitation (may be crushed and given orally or sublingual as needed for agitation or respiratory distress).   leptospermum manuka honey Pste paste Apply 1 Application topically daily. Start taking on: October 25, 2022   LORazepam 1 MG tablet Commonly known as: ATIVAN Take 1 tablet (1 mg total) by mouth every 4 (four) hours as needed for anxiety (may be crushed and given orally or sublingual as needed for pain or respiratory distress).   ondansetron 4 MG disintegrating tablet Commonly known as: ZOFRAN-ODT Take 1 tablet (4 mg total) by mouth every 6 (six) hours as needed for nausea.   oxyCODONE 5 MG immediate release tablet Commonly known as: Oxy IR/ROXICODONE Take 1-2 tablets (5-10 mg total) by  mouth every 2 (two) hours as needed for severe pain (may be crushed and given orally or sublingual as needed for pain or respiratory distress).   polyethylene glycol 17 g packet Commonly known as: MIRALAX / GLYCOLAX Take 17 g by mouth 2 (two) times daily.   polyvinyl alcohol 1.4 % ophthalmic solution Commonly known as: LIQUIFILM TEARS Place 1 drop into both eyes 4 (four) times daily as needed for dry eyes.        Day of Discharge BP 117/82 (BP Location: Left Arm)   Pulse 98   Temp 98.1 F (36.7 C) (Oral)   Resp 17   Ht _0  (1.6 m)   Wt 54.4 kg   SpO2 100%   BMI 21.26 kg/m   Physical Exam: Resting comfortably at time of d/c.   Basic Metabolic Panel: Recent Labs  Lab 10/20/22 2040 10/20/22 2101 10/21/22 0425 10/22/22 0239 10/23/22 0348  NA 129* 128* 132* 130* 130*  K 4.8 4.6 4.3 4.0 3.9  CL 97* 97* 103 103 103  CO2 22  --  21* 18* 18*  GLUCOSE 124* 119* 95 94 128*  BUN 34* 31* 29* 20 24*  CREATININE 0.95 0.80 0.73 0.66 0.85  CALCIUM 10.1  --  9.3 9.7 10.0  MG  --   --   --   --  1.7   CBC: Recent Labs  Lab 10/20/22 2040 10/20/22 2101 10/21/22 0425 10/22/22 0239 10/23/22 0348  WBC 25.2*  --  21.9* 21.7* 24.6*  NEUTROABS 23.1*  --   --   --   --   HGB 11.1* 11.2* 9.6* 9.2* 10.2*  HCT 32.9* 33.0* 28.3* 28.4* 32.1*  MCV 94.3  --  94.3 96.9 100.0  PLT 375  --  312 307 334     Time spent in discharge (includes decision making & examination of pt): 35 minutes  10/24/2022, 1:50 PM   Cherene Altes, MD [facility] Office  [phone]

## 2022-10-24 NOTE — TOC Transition Note (Signed)
Transition of Care [facility]) - CM/SW Discharge Note   Patient Details  Name: Autumn Dean MRN: [id number] Date of Birth: [birth year]  Transition of Care [facility]) CM/SW Contact:  Coralee Pesa, [place] Phone Number: 10/24/2022, 12:51 PM   Clinical Narrative:     Pt to be transported to [facility]. Nurse to call report to [phone]  Final next level of care: Skilled Nursing Facility Barriers to Discharge: Barriers Resolved   Patient Goals and CMS Choice CMS Medicare.gov Compare Post Acute Care list provided to:: Other (Comment Required) (nephrew [name]) Choice offered to / list presented to :  (nephrew [name])  Discharge Placement                Patient chooses bed at: [facility] Patient to be transferred to facility by: [place] Name of family member notified: [name] Patient and family notified of of transfer: 10/24/22  Discharge Plan and Services Additional resources added to the After Visit Summary for     Discharge Planning Services: CM Consult Post Acute Care Choice: Residential Hospice Bed            DME Agency: NA                  Social Determinants of Health ([place]) Interventions SDOH Screenings   Food Insecurity: No Food Insecurity (09/22/2022)  Housing: Low Risk  (09/22/2022)  Transportation Needs: No Transportation Needs (09/22/2022)  Utilities: Not At Risk (09/22/2022)  Alcohol Screen: Low Risk  (09/22/2022)  Depression (PHQ2-9): Low Risk  (09/22/2022)  Financial Resource Strain: Low Risk  (09/22/2022)  Physical Activity: Inactive (09/22/2022)  Social Connections: Unknown (09/22/2022)  Stress: No Stress Concern Present (09/22/2022)  Tobacco Use: Low Risk  (10/20/2022)     Readmission Risk Interventions     No data to display

## 2022-10-24 NOTE — Progress Notes (Addendum)
Patient got 80 cc urine output in foley bag but bed was heavy wet this morning.

## 2022-10-24 NOTE — Progress Notes (Signed)
[facility]) Hospital Liaison Note   Received request from [facility], Magdalen Spatz, RN, for interest in Hospice services and [facility].    Spoke with patient's nephew, Catalina Antigua to initiate education related to hospice philosophy, services, and team approach to care. [name] verbalized understanding of information given. Upon evaluating patient and speaking with patient's Nephew, [name], plan is to return to [place] with hospice support.     No DME needed.  Please send signed and completed DNR home with patient/family. Please provide prescriptions at discharge as needed to ensure ongoing symptom management.    [facility] information and contact numbers given to family & above information shared with TOC.   Please call with any questions/concerns.    Thank you for the opportunity to participate in this patient's care.   Zigmund Gottron  [facility] Liaison  [phone]

## 2022-10-25 LAB — URINE CULTURE: Culture: >=100000 — AB

## 2022-10-27 LAB — CULTURE, BLOOD (ROUTINE X 2)
Culture: NO GROWTH
Culture: NO GROWTH

## 2022-10-28 DIAGNOSIS — L89896 Pressure-induced deep tissue damage of other site: Secondary | ICD-10-CM | POA: Diagnosis not present

## 2022-10-28 DIAGNOSIS — L89150 Pressure ulcer of sacral region, unstageable: Secondary | ICD-10-CM | POA: Diagnosis not present

## 2022-12-04 DEATH — deceased

## 2022-12-12 ENCOUNTER — Telehealth (INDEPENDENT_AMBULATORY_CARE_PROVIDER_SITE_OTHER): Payer: Self-pay | Admitting: Gastroenterology

## 2022-12-12 NOTE — Telephone Encounter (Signed)
Patient is deceased.

## 2024-08-17 ENCOUNTER — Encounter (INDEPENDENT_AMBULATORY_CARE_PROVIDER_SITE_OTHER): Payer: Self-pay | Admitting: Gastroenterology
